# Patient Record
Sex: Male | Born: 1935 | Race: White | Hispanic: No | Marital: Married | Smoking: Former smoker
Health system: Southern US, Community
[De-identification: ages and names within clinical notes are randomized; demographics above are authoritative.]

## PROBLEM LIST (undated history)

## (undated) DIAGNOSIS — N400 Enlarged prostate without lower urinary tract symptoms: Secondary | ICD-10-CM

## (undated) DIAGNOSIS — I10 Essential (primary) hypertension: Secondary | ICD-10-CM

## (undated) DIAGNOSIS — C801 Malignant (primary) neoplasm, unspecified: Secondary | ICD-10-CM

## (undated) HISTORY — PX: ADENOIDECTOMY: SUR15

## (undated) HISTORY — PX: TONSILLECTOMY: SUR1361

## (undated) HISTORY — DX: Essential (primary) hypertension: I10

## (undated) HISTORY — PX: PARTIAL COLECTOMY: SHX5273

## (undated) HISTORY — PX: ROTATOR CUFF REPAIR: SHX139

## (undated) HISTORY — DX: Benign prostatic hyperplasia without lower urinary tract symptoms: N40.0

---

## 2001-10-22 DIAGNOSIS — K635 Polyp of colon: Secondary | ICD-10-CM | POA: Insufficient documentation

## 2006-04-26 ENCOUNTER — Ambulatory Visit: Payer: Self-pay | Admitting: Internal Medicine

## 2007-12-20 ENCOUNTER — Ambulatory Visit: Payer: Self-pay

## 2008-01-31 ENCOUNTER — Ambulatory Visit: Payer: Self-pay | Admitting: Orthopaedic Surgery

## 2008-01-31 ENCOUNTER — Other Ambulatory Visit: Payer: Self-pay

## 2008-02-06 ENCOUNTER — Ambulatory Visit: Payer: Self-pay | Admitting: Orthopaedic Surgery

## 2009-07-21 DEATH — deceased

## 2009-08-11 ENCOUNTER — Ambulatory Visit: Payer: Self-pay | Admitting: Gastroenterology

## 2009-12-21 ENCOUNTER — Ambulatory Visit: Payer: Self-pay | Admitting: Orthopedic Surgery

## 2010-05-05 ENCOUNTER — Ambulatory Visit: Payer: Self-pay | Admitting: Orthopedic Surgery

## 2010-05-11 ENCOUNTER — Ambulatory Visit: Payer: Self-pay | Admitting: Cardiovascular Disease

## 2010-05-17 ENCOUNTER — Ambulatory Visit: Payer: Self-pay | Admitting: Orthopedic Surgery

## 2010-05-19 ENCOUNTER — Ambulatory Visit: Payer: Self-pay | Admitting: Orthopedic Surgery

## 2010-07-06 ENCOUNTER — Ambulatory Visit: Payer: Self-pay | Admitting: Internal Medicine

## 2012-09-18 ENCOUNTER — Encounter: Payer: Self-pay | Admitting: Orthopedic Surgery

## 2012-09-20 ENCOUNTER — Encounter: Payer: Self-pay | Admitting: Orthopedic Surgery

## 2013-01-21 DIAGNOSIS — R972 Elevated prostate specific antigen [PSA]: Secondary | ICD-10-CM | POA: Insufficient documentation

## 2013-01-21 DIAGNOSIS — N529 Male erectile dysfunction, unspecified: Secondary | ICD-10-CM | POA: Insufficient documentation

## 2014-10-08 ENCOUNTER — Ambulatory Visit: Payer: Self-pay | Admitting: Gastroenterology

## 2014-11-24 DIAGNOSIS — I10 Essential (primary) hypertension: Secondary | ICD-10-CM | POA: Insufficient documentation

## 2014-11-24 DIAGNOSIS — R42 Dizziness and giddiness: Secondary | ICD-10-CM | POA: Insufficient documentation

## 2014-11-24 DIAGNOSIS — E785 Hyperlipidemia, unspecified: Secondary | ICD-10-CM | POA: Insufficient documentation

## 2014-11-24 DIAGNOSIS — C4490 Unspecified malignant neoplasm of skin, unspecified: Secondary | ICD-10-CM | POA: Insufficient documentation

## 2014-11-24 DIAGNOSIS — M109 Gout, unspecified: Secondary | ICD-10-CM | POA: Insufficient documentation

## 2014-11-24 DIAGNOSIS — M1990 Unspecified osteoarthritis, unspecified site: Secondary | ICD-10-CM | POA: Insufficient documentation

## 2014-11-24 DIAGNOSIS — R351 Nocturia: Secondary | ICD-10-CM | POA: Insufficient documentation

## 2015-03-15 LAB — SURGICAL PATHOLOGY

## 2015-07-30 ENCOUNTER — Other Ambulatory Visit
Admission: RE | Admit: 2015-07-30 | Discharge: 2015-07-30 | Disposition: A | Discharge status: Home / Self Care | Payer: Medicare Other | Source: Ambulatory Visit | Attending: Internal Medicine | Admitting: Internal Medicine

## 2015-07-30 ENCOUNTER — Other Ambulatory Visit: Payer: Self-pay | Admitting: Internal Medicine

## 2015-07-30 DIAGNOSIS — R51 Headache: Principal | ICD-10-CM

## 2015-07-30 DIAGNOSIS — R519 Headache, unspecified: Secondary | ICD-10-CM

## 2015-07-30 DIAGNOSIS — R61 Generalized hyperhidrosis: Secondary | ICD-10-CM | POA: Diagnosis present

## 2015-07-30 LAB — TROPONIN I: Troponin I: <0.03 ng/mL (ref <0.031)

## 2015-08-04 ENCOUNTER — Ambulatory Visit
Admission: RE | Admit: 2015-08-04 | Discharge: 2015-08-04 | Disposition: A | Discharge status: Home / Self Care | Payer: Medicare Other | Source: Ambulatory Visit | Attending: Internal Medicine | Admitting: Internal Medicine

## 2015-08-04 DIAGNOSIS — R51 Headache: Secondary | ICD-10-CM | POA: Diagnosis not present

## 2015-08-04 DIAGNOSIS — R519 Headache, unspecified: Secondary | ICD-10-CM

## 2015-08-06 ENCOUNTER — Ambulatory Visit: Payer: Self-pay

## 2016-02-23 DIAGNOSIS — I700 Atherosclerosis of aorta: Secondary | ICD-10-CM | POA: Insufficient documentation

## 2016-06-19 ENCOUNTER — Other Ambulatory Visit: Payer: Self-pay | Admitting: Surgery

## 2016-06-19 DIAGNOSIS — M25562 Pain in left knee: Secondary | ICD-10-CM

## 2016-06-29 ENCOUNTER — Ambulatory Visit
Admission: RE | Admit: 2016-06-29 | Discharge: 2016-06-29 | Disposition: A | Discharge status: Home / Self Care | Payer: Medicare Other | Source: Ambulatory Visit | Attending: Surgery | Admitting: Surgery

## 2016-06-29 DIAGNOSIS — M23632 Other spontaneous disruption of medial collateral ligament of left knee: Secondary | ICD-10-CM | POA: Diagnosis not present

## 2016-06-29 DIAGNOSIS — M25462 Effusion, left knee: Secondary | ICD-10-CM | POA: Diagnosis not present

## 2016-06-29 DIAGNOSIS — M25562 Pain in left knee: Secondary | ICD-10-CM | POA: Diagnosis present

## 2016-06-29 DIAGNOSIS — M76892 Other specified enthesopathies of left lower limb, excluding foot: Secondary | ICD-10-CM | POA: Diagnosis not present

## 2017-07-30 ENCOUNTER — Encounter (INDEPENDENT_AMBULATORY_CARE_PROVIDER_SITE_OTHER): Payer: Self-pay

## 2017-07-30 ENCOUNTER — Encounter (INDEPENDENT_AMBULATORY_CARE_PROVIDER_SITE_OTHER): Payer: Medicare Other

## 2017-08-07 ENCOUNTER — Other Ambulatory Visit (INDEPENDENT_AMBULATORY_CARE_PROVIDER_SITE_OTHER): Payer: Self-pay | Admitting: Family Medicine

## 2017-08-07 ENCOUNTER — Other Ambulatory Visit (INDEPENDENT_AMBULATORY_CARE_PROVIDER_SITE_OTHER): Payer: Medicare Other

## 2017-08-07 DIAGNOSIS — I10 Essential (primary) hypertension: Secondary | ICD-10-CM | POA: Diagnosis not present

## 2017-09-26 DIAGNOSIS — M7540 Impingement syndrome of unspecified shoulder: Secondary | ICD-10-CM | POA: Insufficient documentation

## 2018-05-07 ENCOUNTER — Ambulatory Visit (INDEPENDENT_AMBULATORY_CARE_PROVIDER_SITE_OTHER): Payer: Medicare Other | Admitting: Urology

## 2018-05-07 ENCOUNTER — Encounter: Payer: Self-pay | Admitting: Urology

## 2018-05-07 VITALS — BP 165/78 | HR 77 | Ht 69.0 in | Wt 171.6 lb

## 2018-05-07 DIAGNOSIS — N401 Enlarged prostate with lower urinary tract symptoms: Secondary | ICD-10-CM | POA: Diagnosis not present

## 2018-05-07 MED ORDER — SILDENAFIL CITRATE 20 MG PO TABS: ORAL_TABLET | ORAL | 0 refills | Status: DC

## 2018-05-07 MED ORDER — MIRABEGRON ER 50 MG PO TB24: 50.0000 mg | ORAL_TABLET | Freq: Every day | ORAL | 0 refills | Status: DC

## 2018-05-07 NOTE — Progress Notes (Signed)
05/07/2018 9:02 AM   Brian Little [birth year] [id number]  Referring provider: Adin Hector, MD [facility] [place], [place] [address]  Chief Complaint  Patient presents with  . Benign Prostatic Hypertrophy    HPI: 84 year old male previously seen by me in [place] and at [facility] for BPH and an elevated PSA.  He had a prostate biopsy 1998 for PSA of 8.6 with benign pathology.  His most recent PSA results have been stable in the upper 3/low 4 range.  He has a several year history of lower urinary tract symptoms with bothersome nocturia.  He had no improvement with anticholinergic medication, DDAVP.  He had orthostatic changes with tamsulosin.  At his visit last year he was given a trial of silodosin however also had lightheadedness and headache.  His voiding pattern is currently stable.  He has nocturia x4-6.  He takes sildenafil as needed for ED with good efficacy.  His PSA last year was 3.69.   PMH: Past Medical History:  Diagnosis Date  . Hypertension     Surgical History: Past Surgical History:  Procedure Laterality Date  . ROTATOR CUFF REPAIR     x 2, 2008, 2011    Home Medications:  Allergies as of 05/07/2018      Reactions   Bee Venom Anaphylaxis   Caffeine Other (See Comments)   Other reaction(s): Other Vertigo vertigo   Ibuprofen    Other reaction(s): Other (See Comments), Other (See Comments) vertigo vertigo   Nsaids Other (See Comments)   Other reaction(s): Other (See Comments) Vertigo and n/v Vertigo and n/v      Medication List        Accurate as of 05/07/18  9:02 AM. Always use your most recent med list.          amLODipine 10 MG tablet Commonly known as:  NORVASC   aspirin 325 MG tablet Take by mouth.   Azelastine HCl 137 MCG/SPRAY Soln azelastine 137 mcg (0.1 %) nasal spray aerosol   cetirizine 10 MG tablet Commonly known as:  ZYRTEC Take by mouth.   chlorhexidine 0.12 % solution Commonly known  as:  PERIDEX chlorhexidine gluconate 0.12 % mouthwash   fluticasone 50 MCG/ACT nasal spray Commonly known as:  FLONASE SPRAY 2 SPRAYS INTO EACH NOSTRIL NIGHTLY IF NEEDED   hydrochlorothiazide 25 MG tablet Commonly known as:  HYDRODIURIL TK 1 T PO  D   losartan 100 MG tablet Commonly known as:  COZAAR   meclizine 25 MG tablet Commonly known as:  ANTIVERT Take by mouth.   sildenafil 20 MG tablet Commonly known as:  REVATIO 1-5 tablets as needed 1 hr prior to intercourse       Allergies:  Allergies  Allergen Reactions  . Bee Venom Anaphylaxis  . Caffeine Other (See Comments)    Other reaction(s): Other Vertigo vertigo   . Ibuprofen     Other reaction(s): Other (See Comments), Other (See Comments) vertigo vertigo   . Nsaids Other (See Comments)    Other reaction(s): Other (See Comments) Vertigo and n/v Vertigo and n/v     Family History: Family History  Problem Relation Age of Onset  . Heart disease Father   . COPD Mother     Social History:  reports that he has never smoked. He has never used smokeless tobacco. He reports that he does not use drugs. His alcohol history is not on file.  ROS: UROLOGY Frequent Urination?: Yes Hard to postpone urination?:  No Burning/pain with urination?: No Get up at night to urinate?: Yes Leakage of urine?: No Urine stream starts and stops?: Yes Trouble starting stream?: No Do you have to strain to urinate?: No Blood in urine?: No Urinary tract infection?: No Sexually transmitted disease?: No Injury to kidneys or bladder?: No Painful intercourse?: No Weak stream?: Yes Erection problems?: No Penile pain?: No  Gastrointestinal Nausea?: No Vomiting?: No Indigestion/heartburn?: No Diarrhea?: No Constipation?: No  Constitutional Fever: No Night sweats?: No Weight loss?: No Fatigue?: No  Skin Skin rash/lesions?: No Itching?: No  Eyes Blurred vision?: No Double vision?: No  Ears/Nose/Throat Sore  throat?: No Sinus problems?: No  Hematologic/Lymphatic Swollen glands?: No Easy bruising?: No  Cardiovascular Leg swelling?: No Chest pain?: No  Respiratory Cough?: No Shortness of breath?: No  Endocrine Excessive thirst?: No  Musculoskeletal Back pain?: No Joint pain?: No  Neurological Headaches?: No Dizziness?: No  Psychologic Depression?: No Anxiety?: No  Physical Exam: BP (!) 165/78 (BP Location: Left Arm, Patient Position: Sitting, Cuff Size: Normal)   Pulse 77   Ht 5\' 9"  (1.753 m)   Wt 171 lb 9.6 oz (77.8 kg)   SpO2 99%   BMI 25.34 kg/m   Constitutional:  Alert and oriented, No acute distress. HEENT: [place] AT, moist mucus membranes.  Trachea midline, no masses. Cardiovascular: No clubbing, cyanosis, or edema. Respiratory: Normal respiratory effort, no increased work of breathing. GI: Abdomen is soft, nontender, nondistended, no abdominal masses GU: No CVA tenderness prostate 80+ cc, smooth without nodules. Lymph: No cervical or inguinal lymphadenopathy. Skin: No rashes, bruises or suspicious lesions. Neurologic: Grossly intact, no focal deficits, moving all 4 extremities. Psychiatric: Normal mood and affect.   Assessment & Plan:   84 year old male with lower urinary tract symptoms which are stable.  Will give a trial of Myrbetriq and he was given samples 50 mg.  We discussed last year discontinuing PSA screening based on his age and stability.  He requested that his PSA be checked one more time.  Sildenafil was refilled.  Return in about 1 year (around 05/08/2019) for Recheck.   Abbie Sons, [facility] [address] [place], [place] [address] [phone]

## 2018-05-08 ENCOUNTER — Telehealth: Payer: Self-pay

## 2018-05-08 LAB — PSA: Prostate Specific Ag, Serum: 4.4 ng/mL — ABNORMAL HIGH (ref 0.0–4.0)

## 2018-05-08 NOTE — Telephone Encounter (Signed)
Pt informed

## 2018-05-08 NOTE — Telephone Encounter (Signed)
-----   Message from Abbie Sons, MD sent at 05/08/2018  7:23 AM EDT ----- PSA was 4.4 which is within his baseline.

## 2018-06-04 ENCOUNTER — Telehealth: Payer: Self-pay | Admitting: Urology

## 2018-06-04 MED ORDER — MIRABEGRON ER 50 MG PO TB24: 50.0000 mg | ORAL_TABLET | Freq: Every day | ORAL | 6 refills | Status: DC

## 2018-06-04 NOTE — Addendum Note (Signed)
Addended by: Abbie Sons on: 06/04/2018 09:57 PM   Modules accepted: Orders

## 2018-06-04 NOTE — Telephone Encounter (Signed)
Pt states he was given sample to try and so far likes the results, Pt asks for Rx of Myrbetriq 50 mg to be sent to [facility] please.  Please advise pt at [phone].

## 2018-06-04 NOTE — Telephone Encounter (Signed)
rx sent

## 2018-11-01 DIAGNOSIS — D7219 Other eosinophilia: Secondary | ICD-10-CM | POA: Insufficient documentation

## 2018-11-01 DIAGNOSIS — G63 Polyneuropathy in diseases classified elsewhere: Secondary | ICD-10-CM | POA: Insufficient documentation

## 2018-11-01 DIAGNOSIS — I739 Peripheral vascular disease, unspecified: Secondary | ICD-10-CM | POA: Insufficient documentation

## 2018-11-05 ENCOUNTER — Other Ambulatory Visit: Payer: Self-pay | Admitting: Urology

## 2018-11-05 MED ORDER — SILDENAFIL CITRATE 20 MG PO TABS: ORAL_TABLET | ORAL | 0 refills | Status: DC

## 2018-11-05 NOTE — Telephone Encounter (Signed)
Dr. Bernardo Heater  Please advise on pt refill request for Sildenafil 20mg  last filled 05/07/18 with #[phone]

## 2018-11-05 NOTE — Telephone Encounter (Signed)
Pt asking for refill on his sildenafil 20mg  at [facility]. Please advise. Thanks

## 2018-11-18 ENCOUNTER — Other Ambulatory Visit: Payer: Self-pay | Admitting: Internal Medicine

## 2018-11-18 DIAGNOSIS — R9389 Abnormal findings on diagnostic imaging of other specified body structures: Secondary | ICD-10-CM

## 2018-11-26 ENCOUNTER — Ambulatory Visit
Admission: RE | Admit: 2018-11-26 | Discharge: 2018-11-26 | Disposition: A | Discharge status: Home / Self Care | Payer: Medicare Other | Source: Ambulatory Visit | Attending: Internal Medicine | Admitting: Internal Medicine

## 2018-11-26 DIAGNOSIS — R9389 Abnormal findings on diagnostic imaging of other specified body structures: Secondary | ICD-10-CM | POA: Insufficient documentation

## 2018-11-29 ENCOUNTER — Other Ambulatory Visit: Payer: Self-pay | Admitting: Internal Medicine

## 2018-11-29 DIAGNOSIS — R93429 Abnormal radiologic findings on diagnostic imaging of unspecified kidney: Secondary | ICD-10-CM

## 2018-12-12 ENCOUNTER — Ambulatory Visit
Admission: RE | Admit: 2018-12-12 | Discharge: 2018-12-12 | Disposition: A | Discharge status: Home / Self Care | Payer: Medicare Other | Source: Ambulatory Visit | Attending: Internal Medicine | Admitting: Internal Medicine

## 2018-12-12 DIAGNOSIS — R93429 Abnormal radiologic findings on diagnostic imaging of unspecified kidney: Secondary | ICD-10-CM | POA: Insufficient documentation

## 2018-12-16 DIAGNOSIS — R911 Solitary pulmonary nodule: Secondary | ICD-10-CM | POA: Insufficient documentation

## 2018-12-20 ENCOUNTER — Other Ambulatory Visit
Admission: RE | Admit: 2018-12-20 | Discharge: 2018-12-20 | Disposition: A | Discharge status: Home / Self Care | Payer: Medicare Other | Source: Ambulatory Visit | Attending: Internal Medicine | Admitting: Internal Medicine

## 2018-12-20 DIAGNOSIS — I10 Essential (primary) hypertension: Secondary | ICD-10-CM | POA: Diagnosis present

## 2018-12-20 DIAGNOSIS — I700 Atherosclerosis of aorta: Secondary | ICD-10-CM | POA: Diagnosis present

## 2018-12-20 DIAGNOSIS — I739 Peripheral vascular disease, unspecified: Secondary | ICD-10-CM | POA: Insufficient documentation

## 2018-12-20 DIAGNOSIS — M1000 Idiopathic gout, unspecified site: Secondary | ICD-10-CM | POA: Diagnosis present

## 2018-12-20 DIAGNOSIS — G63 Polyneuropathy in diseases classified elsewhere: Secondary | ICD-10-CM | POA: Diagnosis present

## 2018-12-20 DIAGNOSIS — D721 Eosinophilia: Secondary | ICD-10-CM | POA: Insufficient documentation

## 2018-12-20 LAB — TROPONIN I: Troponin I: 0.04 ng/mL (ref <0.03)

## 2018-12-31 ENCOUNTER — Other Ambulatory Visit
Admission: RE | Admit: 2018-12-31 | Discharge: 2018-12-31 | Disposition: A | Discharge status: Home / Self Care | Payer: Medicare Other | Source: Ambulatory Visit | Attending: Cardiology | Admitting: Cardiology

## 2018-12-31 DIAGNOSIS — Z8679 Personal history of other diseases of the circulatory system: Secondary | ICD-10-CM | POA: Insufficient documentation

## 2018-12-31 LAB — TROPONIN I

## 2019-01-09 ENCOUNTER — Inpatient Hospital Stay: Payer: Medicare Other | Attending: Oncology | Admitting: Oncology

## 2019-01-09 ENCOUNTER — Encounter: Payer: Self-pay | Admitting: Oncology

## 2019-01-09 ENCOUNTER — Inpatient Hospital Stay: Payer: Medicare Other | Admitting: *Deleted

## 2019-01-09 ENCOUNTER — Other Ambulatory Visit: Payer: Self-pay

## 2019-01-09 VITALS — BP 159/87 | HR 75 | Temp 97.4°F | Resp 18 | Ht 70.5 in | Wt 172.1 lb

## 2019-01-09 DIAGNOSIS — D721 Eosinophilia, unspecified: Secondary | ICD-10-CM

## 2019-01-09 DIAGNOSIS — Z79899 Other long term (current) drug therapy: Secondary | ICD-10-CM | POA: Diagnosis not present

## 2019-01-09 DIAGNOSIS — N400 Enlarged prostate without lower urinary tract symptoms: Secondary | ICD-10-CM

## 2019-01-09 DIAGNOSIS — Z87891 Personal history of nicotine dependence: Secondary | ICD-10-CM

## 2019-01-09 DIAGNOSIS — Z7982 Long term (current) use of aspirin: Secondary | ICD-10-CM | POA: Diagnosis not present

## 2019-01-09 DIAGNOSIS — I10 Essential (primary) hypertension: Secondary | ICD-10-CM | POA: Insufficient documentation

## 2019-01-09 LAB — CBC WITH DIFFERENTIAL/PLATELET
ABS IMMATURE GRANULOCYTES: 0.03 10*3/uL (ref 0.00–0.07)
Basophils Absolute: 0.1 10*3/uL (ref 0.0–0.1)
Basophils Relative: 1 %
Eosinophils Absolute: 1.9 10*3/uL — ABNORMAL HIGH (ref 0.0–0.5)
Eosinophils Relative: 21 %
HCT: 42.4 % (ref 39.0–52.0)
Hemoglobin: 14.9 g/dL (ref 13.0–17.0)
Immature Granulocytes: 0 %
LYMPHS ABS: 1.9 10*3/uL (ref 0.7–4.0)
Lymphocytes Relative: 20 %
MCH: 31.4 pg (ref 26.0–34.0)
MCHC: 35.1 g/dL (ref 30.0–36.0)
MCV: 89.5 fL (ref 80.0–100.0)
Monocytes Absolute: 0.8 10*3/uL (ref 0.1–1.0)
Monocytes Relative: 8 %
Neutro Abs: 4.7 10*3/uL (ref 1.7–7.7)
Neutrophils Relative %: 50 %
Platelets: 235 10*3/uL (ref 150–400)
RBC: 4.74 MIL/uL (ref 4.22–5.81)
RDW: 13.4 % (ref 11.5–15.5)
WBC: 9.4 10*3/uL (ref 4.0–10.5)
nRBC: 0 % (ref 0.0–0.2)

## 2019-01-09 LAB — TECHNOLOGIST SMEAR REVIEW: Plt Morphology: NORMAL

## 2019-01-09 NOTE — Progress Notes (Signed)
Patient here for initial visit. °

## 2019-01-10 NOTE — Progress Notes (Addendum)
Hematology/Oncology Consult note [facility] Telephone:([phone] [phone]   Patient Care Team: Adin Hector, MD as PCP - General (Internal Medicine)  REFERRING PROVIDER: Adin Hector, MD CHIEF COMPLAINTS/REASON FOR VISIT:  Evaluation of eosinophilia  HISTORY OF PRESENTING ILLNESS:  Brian Little is a  85 y.o.  male with PMH listed below who was referred to me for evaluation of eosinophilia. Patient follows up with Dr. [name].  He has history of chronic eosinophilia Patient has had work-up done for eosinophilia including echocardiogram, chest x-ray, stool studies for parasites and tryptase, troponin to look for endorgan damage and signs of hypereosinophilic syndrome.  10/25/2018 CBC showed white count 8.6, hemoglobin 15.3, platelet 245,000, Differential showed absolute eosinophils 1.83, eosinophil 21.4%. Chemistry showed sodium 135, potassium 3.5, chloride 96, creatinine 1.1, AST 18, ALT 15, bilirubin 0.8 UA negative 12/20/2018, CBC showed absolute eosinophils 1.67, eosinophil 20.9%  12/20/2018 stool parasites negative, tryptase elevated at 26, troponin level not available to me via care everywhere, noted to be elevated 0.04 in cardiology note.  Uric acid 6.5 10/25/2018 TSH 2.935 12/31/2018 ESR 7, Patient was seen by cardiology Dr. Ubaldo Glassing on 12/31/2018.  Per note, echocardiogram was read as showing LV function with no regional or global wall motion abnormalities.  LVEF 55% patient is also asymptomatic.  Denies any chest pain, significant dyspnea on exertion.  EKG shows no ischemic changes.  Troponin was repeated at the cardiologist office.  Results were not available to me.  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  Denies any cough, wheezing, fever, night sweats, unintentional weight loss, fatigue, eczema, pruritus, skin rash, lower extremity edema, nausea, vomiting, diarrhea, food intolerance, changes in bowel  habits, balancing issue, facial flushing, abdominal pain.  Denies any substance use.  Former smoker, quit in 1981.  Denies any alcohol use. Denies any recent start of any new medication.   Patient has history of allergy and takes Zyrtec 10 mg daily, Flonase.  He reports symptoms are stable, not acutely worsened.  Recent images were independently reviewed 11/18/2018, chest x-ray PA and lateral not available to me. 11/26/2018 CT chest without showed no evidence of airspace disease or consolidation at the right lung base. Focal groundglass density in the right upper lobe measuring roughly 7 mm.  Recommend follow-up CT 6 to 12 months. Mildly prominent lymph nodes in the mediastinum are nonspecific.  Aortic atherosclerosis, coronary artery calcifications, thickening of the left adrenal tissue.  Findings are probably associated with hyperplasia or adenoma but nonspecific.  Left kidney has a lobulated appearance and cannot exclude focal thickening or granularity along the anterior lateral aspect of the left kidney.  Consider further evaluation with renal ultrasound. 12/12/2018 renal ultrasound showed left kidney 1.5 cm cyst, no other abnormalities.    Review of Systems  Constitutional: Negative for appetite change, chills, fatigue, fever and unexpected weight change.  HENT:   Negative for hearing loss and voice change.        Allergy  Eyes: Negative for eye problems and icterus.  Respiratory: Negative for chest tightness, cough and shortness of breath.   Cardiovascular: Negative for chest pain and leg swelling.  Gastrointestinal: Negative for abdominal distention and abdominal pain.  Endocrine: Negative for hot flashes.  Genitourinary: Negative for difficulty urinating, dysuria and frequency.   Musculoskeletal: Negative for arthralgias.  Skin: Negative for itching and rash.  Neurological: Negative for light-headedness and numbness.  Hematological: Negative for adenopathy. Does not bruise/bleed  easily.  Psychiatric/Behavioral: Negative  for confusion.    MEDICAL HISTORY:  Past Medical History:  Diagnosis Date  . BPH (benign prostatic hyperplasia)   . Hypertension     SURGICAL HISTORY: Past Surgical History:  Procedure Laterality Date  . ADENOIDECTOMY    . ROTATOR CUFF REPAIR     x 2, 2008, 2011  . ROTATOR CUFF REPAIR     left and right  . TONSILLECTOMY     85 years old    SOCIAL HISTORY: Social History   Socioeconomic History  . Marital status: Married    Spouse name: Not on file  . Number of children: Not on file  . Years of education: Not on file  . Highest education level: Not on file  Occupational History  . Not on file  Social Needs  . Financial resource strain: Not on file  . Food insecurity:    Worry: Not on file    Inability: Not on file  . Transportation needs:    Medical: Not on file    Non-medical: Not on file  Tobacco Use  . Smoking status: Former Smoker    Last attempt to quit: 01/10/1980    Years since quitting: 39.0  . Smokeless tobacco: Never Used  Substance and Sexual Activity  . Alcohol use: Never    Frequency: Never  . Drug use: Never  . Sexual activity: Yes    Birth control/protection: None  Lifestyle  . Physical activity:    Days per week: Not on file    Minutes per session: Not on file  . Stress: Not on file  Relationships  . Social connections:    Talks on phone: Not on file    Gets together: Not on file    Attends religious service: Not on file    Active member of club or organization: Not on file    Attends meetings of clubs or organizations: Not on file    Relationship status: Not on file  . Intimate partner violence:    Fear of current or ex partner: Not on file    Emotionally abused: Not on file    Physically abused: Not on file    Forced sexual activity: Not on file  Other Topics Concern  . Not on file  Social History Narrative  . Not on file    FAMILY HISTORY: Family History  Problem Relation Age of  Onset  . Heart disease Father   . Congestive Heart Failure Father   . COPD Mother   . Hypertension Mother     ALLERGIES:  is allergic to bee venom; caffeine; ibuprofen; and nsaids.  MEDICATIONS:  Current Outpatient Medications  Medication Sig Dispense Refill  . amLODipine (NORVASC) 10 MG tablet   2  . aspirin 325 MG tablet Take by mouth.    . Azelastine HCl 137 MCG/SPRAY SOLN azelastine 137 mcg (0.1 %) nasal spray aerosol    . cetirizine (ZYRTEC) 10 MG tablet Take by mouth.    . chlorhexidine (PERIDEX) 0.12 % solution chlorhexidine gluconate 0.12 % mouthwash    . fluticasone (FLONASE) 50 MCG/ACT nasal spray SPRAY 2 SPRAYS INTO EACH NOSTRIL NIGHTLY IF NEEDED  4  . hydrochlorothiazide (HYDRODIURIL) 25 MG tablet TK 1 T PO  D  3  . losartan (COZAAR) 100 MG tablet   2  . meclizine (ANTIVERT) 25 MG tablet Take by mouth.    . sildenafil (REVATIO) 20 MG tablet 1-5 tablets as needed 1 hr prior to intercourse 90 tablet 0  . vitamin B-12 (  CYANOCOBALAMIN) 500 MCG tablet Take 500 mcg by mouth daily.    . mirabegron ER (MYRBETRIQ) 50 MG TB24 tablet Take 1 tablet (50 mg total) by mouth daily. (Patient not taking: Reported on 01/09/2019) 30 tablet 6  . Potassium (GNP POTASSIUM) 99 MG TABS Take by mouth. Take one tab daily     No current facility-administered medications for this visit.      PHYSICAL EXAMINATION: ECOG PERFORMANCE STATUS: 0 - Asymptomatic Vitals:   01/09/19 0931  BP: (!) 159/87  Pulse: 75  Resp: 18  Temp: (!) 97.4 F (36.3 C)   Filed Weights   01/09/19 0931  Weight: 172 lb 1.6 oz (78.1 kg)    Physical Exam Constitutional:      General: He is not in acute distress. HENT:     Head: Normocephalic and atraumatic.  Eyes:     General: No scleral icterus.    Pupils: Pupils are equal, round, and reactive to light.  Neck:     Musculoskeletal: Normal range of motion and neck supple.  Cardiovascular:     Rate and Rhythm: Normal rate and regular rhythm.     Heart sounds:  Normal heart sounds.  Pulmonary:     Effort: Pulmonary effort is normal. No respiratory distress.     Breath sounds: No wheezing.  Abdominal:     General: Bowel sounds are normal. There is no distension.     Palpations: Abdomen is soft. There is no mass.     Tenderness: There is no abdominal tenderness.  Musculoskeletal: Normal range of motion.        General: No deformity.  Skin:    General: Skin is warm and dry.     Findings: No erythema or rash.  Neurological:     Mental Status: He is alert and oriented to person, place, and time.     Cranial Nerves: No cranial nerve deficit.     Coordination: Coordination normal.  Psychiatric:        Behavior: Behavior normal.        Thought Content: Thought content normal.     RADIOGRAPHIC STUDIES: I have personally reviewed the radiological images as listed and agreed with the findings in the report.  No flowsheet data found. CBC Latest Ref Rng & Units 01/09/2019  WBC 4.0 - 10.5 K/uL 9.4  Hemoglobin 13.0 - 17.0 g/dL 14.9  Hematocrit 39.0 - 52.0 % 42.4  Platelets 150 - 400 K/uL 235    LABORATORY DATA:  I have reviewed the data as listed Lab Results  Component Value Date   WBC 9.4 01/09/2019   HGB 14.9 01/09/2019   HCT 42.4 01/09/2019   MCV 89.5 01/09/2019   PLT 235 01/09/2019   No results for input(s): NA, K, CL, CO2, GLUCOSE, BUN, CREATININE, CALCIUM, GFRNONAA, GFRAA, PROT, ALBUMIN, AST, ALT, ALKPHOS, BILITOT, BILIDIR, IBILI in the last 8760 hours. Iron/TIBC/Ferritin/ %Sat No results found for: IRON, TIBC, FERRITIN, IRONPCTSAT      ASSESSMENT & PLAN:  1. Eosinophilia    Labs reviewed and discussed with patient. HE is asymptomatic Absolute eosinophil count >1500/ul, recommend work up for Hypereosinophilic syndrome.   recommend to repeat CBC with differential, smear, tryptase, Immunoglobulin quantification, flowcytometry, MPN/HES panel  Additional testing based on work up results of above testing.    Orders Placed This  Encounter  Procedures  . CBC with Differential/Platelet    Standing Status:   Future    Number of Occurrences:   1    Standing Expiration Date:  01/10/2020  . Technologist smear review    Standing Status:   Future    Number of Occurrences:   1    Standing Expiration Date:   01/10/2020  . Tryptase    Standing Status:   Future    Number of Occurrences:   1    Standing Expiration Date:   01/10/2020  . Immunoglobulins, QN, A/E/G/M    Standing Status:   Future    Number of Occurrences:   1    Standing Expiration Date:   01/10/2020  . Flow cytometry panel-leukemia/lymphoma work-up    Standing Status:   Future    Number of Occurrences:   1    Standing Expiration Date:   01/10/2020    All questions were answered. The patient knows to call the clinic with any problems questions or concerns.  Return of visit: to be determined.  Thank you for this kind referral and the opportunity to participate in the care of this patient. A copy of today's note is routed to referring provider  Total face to face encounter time for this patient visit was 45 min. >50% of the time was  spent in counseling and coordination of care.    Earlie Server, MD, PhD Hematology Oncology [facility]- [phone] 01/10/2019  Addendum:  01/30/2019 Called patient and discussed with him about the results.  Tryptase is elevated. Immunoglobulin quantification showed slight elevation of IgA,  Flowcytometry showed no monoclonal B cell population is detected.  HES FISH panel showed no detection of  LNX1 (CHIC2), or rearrangement of PDGFRA,  PDGFRB, OR FGFR1 Discussed with patient that we can obtained additional testing, including JAK2, BCR ABL, tissue biopsy.  I recommend him to obtain a second opinion at tertiary center.  Patient says he is currently undergoing cardiology work up.  He will let me know about his decision.   Earlie Server 01/30/2019

## 2019-01-11 LAB — TRYPTASE: Tryptase: 25.4 ug/L — ABNORMAL HIGH (ref 2.2–13.2)

## 2019-01-11 LAB — IMMUNOGLOBULINS A/E/G/M, SERUM
IGM (IMMUNOGLOBULIN M), SRM: 82 mg/dL (ref 15–143)
IgA: 477 mg/dL — ABNORMAL HIGH (ref 61–437)
IgE (Immunoglobulin E), Serum: 67 IU/mL (ref 6–495)
IgG (Immunoglobin G), Serum: 868 mg/dL (ref 700–1600)

## 2019-01-13 LAB — COMP PANEL: LEUKEMIA/LYMPHOMA

## 2019-01-28 LAB — MISC LABCORP TEST (SEND OUT): Labcorp test code: 511444

## 2019-05-05 ENCOUNTER — Telehealth (INDEPENDENT_AMBULATORY_CARE_PROVIDER_SITE_OTHER): Payer: Medicare Other | Admitting: Urology

## 2019-05-05 ENCOUNTER — Other Ambulatory Visit: Payer: Self-pay

## 2019-05-05 DIAGNOSIS — N138 Other obstructive and reflux uropathy: Secondary | ICD-10-CM

## 2019-05-05 DIAGNOSIS — N401 Enlarged prostate with lower urinary tract symptoms: Secondary | ICD-10-CM

## 2019-05-05 DIAGNOSIS — N5201 Erectile dysfunction due to arterial insufficiency: Secondary | ICD-10-CM

## 2019-05-05 DIAGNOSIS — R972 Elevated prostate specific antigen [PSA]: Secondary | ICD-10-CM

## 2019-05-05 MED ORDER — TADALAFIL 5 MG PO TABS: 5.0000 mg | ORAL_TABLET | Freq: Every day | ORAL | 1 refills | Status: DC

## 2019-05-05 NOTE — Progress Notes (Signed)
Virtual Visit via Telephone Note  I connected with Brian Little on 05/05/19 at 11:00 AM EDT by telephone and verified that I am speaking with the correct person using two identifiers.  Location: Patient: Home Provider: Office   I discussed the limitations, risks, security and privacy concerns of performing an evaluation and management service by telephone and the availability of in person appointments. I also discussed with the patient that there may be a patient responsible charge related to this service. The patient expressed understanding and agreed to proceed.  Urologic history: 1.  BPH with lower urinary tract symptoms with nocturia  -Bothersome LUTS; unable to tolerate tamsulosin and silodosin secondary to orthostatic changes/headache  No significant improvement on anticholinergic medication, DDAVP and Myrbetriq  2.  History elevated PSA  -Biopsy 1998 PSA 8.6 with benign pathology  -Most recent PSA levels over 3/low 4 range  History of Present Illness: 85 year old male presents for annual follow-up via telephone secondary to COVID-19 pandemic.  Since his visit last year he states his voiding pattern has stable.  His nocturia on average is 3-4 times per night.  He did not see any significant improvement with Myrbetriq.  He states his voiding symptoms not bothersome of that he would want outlet surgery.  Based on age and PSA stability we discussed discontinuing PSA screening last year however he wanted it checked 1 more time and that PSA was stable at 4.4.   Observations/Objective: N/A  Assessment and Plan: 85 year old male with BPH and lower urinary tract symptoms.  He inquired about other medications.  We discussed both daily low-dose tadalafil and finasteride.  He does take sildenafil for ED.  Will discontinue the sildenafil and give a trial of low-dose tadalafil.  He will try for 90 days and if no significant efficacy on his LUTS will add finasteride.  Discontinue PSA  screening.  Follow Up Instructions: 6 months   I discussed the assessment and treatment plan with the patient. The patient was provided an opportunity to ask questions and all were answered. The patient agreed with the plan and demonstrated an understanding of the instructions.   The patient was advised to call back or seek an in-person evaluation if the symptoms worsen or if the condition fails to improve as anticipated.  I provided 15 minutes of non-face-to-face time during this encounter.   Abbie Sons, MD

## 2019-05-09 ENCOUNTER — Ambulatory Visit: Payer: Medicare Other | Admitting: Urology

## 2019-10-14 ENCOUNTER — Telehealth: Payer: Self-pay | Admitting: Urology

## 2019-10-14 DIAGNOSIS — N401 Enlarged prostate with lower urinary tract symptoms: Secondary | ICD-10-CM

## 2019-10-14 MED ORDER — TADALAFIL 5 MG PO TABS: 5.0000 mg | ORAL_TABLET | Freq: Every day | ORAL | 1 refills | Status: DC

## 2019-10-14 NOTE — Telephone Encounter (Signed)
Pt requests a refill for Tadalafil sent to [facility]. Please call pt when this is done.

## 2019-10-14 NOTE — Telephone Encounter (Signed)
RX sent. Called pt to inform him rx was sent. Line is busy

## 2019-11-04 ENCOUNTER — Ambulatory Visit: Payer: Medicare Other | Admitting: Urology

## 2019-11-05 ENCOUNTER — Ambulatory Visit (INDEPENDENT_AMBULATORY_CARE_PROVIDER_SITE_OTHER): Payer: Medicare Other | Admitting: Urology

## 2019-11-05 ENCOUNTER — Other Ambulatory Visit: Payer: Self-pay

## 2019-11-05 ENCOUNTER — Encounter: Payer: Self-pay | Admitting: Urology

## 2019-11-05 VITALS — BP 180/74 | Ht 70.5 in | Wt 165.0 lb

## 2019-11-05 DIAGNOSIS — N138 Other obstructive and reflux uropathy: Secondary | ICD-10-CM

## 2019-11-05 DIAGNOSIS — R351 Nocturia: Secondary | ICD-10-CM

## 2019-11-05 DIAGNOSIS — N401 Enlarged prostate with lower urinary tract symptoms: Secondary | ICD-10-CM

## 2019-11-05 NOTE — Progress Notes (Signed)
11/05/2019 9:52 AM   Brian Little [birth year] [id number]  Referring provider: Adin Hector, MD [facility] [place],  [place] [address]  Chief Complaint  Patient presents with  . Follow-up    Urologic history: 1.  BPH with lower urinary tract symptoms with nocturia             -Bothersome LUTS; unable to tolerate tamsulosin and silodosin secondary to orthostatic changes/headache             -No significant improvement on anticholinergic medication, DDAVP and Myrbetriq  2.  History elevated PSA             -Biopsy 1998 PSA 8.6 with benign pathology             -Most recent PSA levels over 3/low 4 range  HPI: 85 y.o. male presents for follow-up of lower urinary tract symptoms.  Had a telephone visit June 2020 he elected to start low-dose tadalafil.  He has noted mild improvement in his lower urinary tract symptoms and desires to continue.  He does have nocturia x4 which does not interrupt his sleep pattern.  No history of snoring or sleep apnea.  His nighttime voided volumes are similar to daytime.  When he does not limit nighttime fluids he gets up 5-6 times per night.  Denies dysuria, gross hematuria or flank, abdominal, pelvic pain.   PMH: Past Medical History:  Diagnosis Date  . BPH (benign prostatic hyperplasia)   . Hypertension     Surgical History: Past Surgical History:  Procedure Laterality Date  . ADENOIDECTOMY    . ROTATOR CUFF REPAIR     x 2, 2008, 2011  . ROTATOR CUFF REPAIR     left and right  . TONSILLECTOMY     85 years old    Home Medications:  Allergies as of 11/05/2019      Reactions   Bee Venom Anaphylaxis   Caffeine Other (See Comments)   Other reaction(s): Other Vertigo vertigo   Ibuprofen    Other reaction(s): Other (See Comments), Other (See Comments) vertigo vertigo   Nsaids Other (See Comments)   Other reaction(s): Other (See Comments) Vertigo and n/v Vertigo and n/v      Medication List        Accurate as of November 05, 2019  9:52 AM. If you have any questions, ask your nurse or doctor.        amLODipine 10 MG tablet Commonly known as: NORVASC   aspirin 325 MG tablet Take by mouth.   Azelastine HCl 137 MCG/SPRAY Soln azelastine 137 mcg (0.1 %) nasal spray aerosol   cetirizine 10 MG tablet Commonly known as: ZYRTEC Take by mouth.   chlorhexidine 0.12 % solution Commonly known as: PERIDEX chlorhexidine gluconate 0.12 % mouthwash   fluticasone 50 MCG/ACT nasal spray Commonly known as: FLONASE SPRAY 2 SPRAYS INTO EACH NOSTRIL NIGHTLY IF NEEDED   GNP Potassium 99 MG Tabs Generic drug: Potassium Take by mouth. Take one tab daily   hydrochlorothiazide 25 MG tablet Commonly known as: HYDRODIURIL TK 1 T PO  D   losartan 100 MG tablet Commonly known as: COZAAR   meclizine 25 MG tablet Commonly known as: ANTIVERT Take by mouth.   Potassium Gluconate 2.5 MEQ Tabs Take by mouth.   predniSONE 5 MG tablet Commonly known as: DELTASONE Take by mouth.   tadalafil 5 MG tablet Commonly known as: CIALIS Take 1 tablet (5 mg total) by mouth  daily.   vitamin B-12 500 MCG tablet Commonly known as: CYANOCOBALAMIN Take 500 mcg by mouth daily.       Allergies:  Allergies  Allergen Reactions  . Bee Venom Anaphylaxis  . Caffeine Other (See Comments)    Other reaction(s): Other Vertigo vertigo   . Ibuprofen     Other reaction(s): Other (See Comments), Other (See Comments) vertigo vertigo   . Nsaids Other (See Comments)    Other reaction(s): Other (See Comments) Vertigo and n/v Vertigo and n/v     Family History: Family History  Problem Relation Age of Onset  . Heart disease Father   . Congestive Heart Failure Father   . COPD Mother   . Hypertension Mother     Social History:  reports that he quit smoking about 39 years ago. He has never used smokeless tobacco. He reports that he does not drink alcohol or use drugs.  ROS: UROLOGY Frequent  Urination?: Yes Hard to postpone urination?: No Burning/pain with urination?: No Get up at night to urinate?: Yes Leakage of urine?: No Urine stream starts and stops?: No Trouble starting stream?: No Do you have to strain to urinate?: No Blood in urine?: No Urinary tract infection?: No Sexually transmitted disease?: No Injury to kidneys or bladder?: No Painful intercourse?: No Weak stream?: Yes Erection problems?: No Penile pain?: No  Gastrointestinal Nausea?: No Vomiting?: No Indigestion/heartburn?: No Diarrhea?: No Constipation?: No  Constitutional Fever: No Night sweats?: No Weight loss?: No Fatigue?: No  Skin Skin rash/lesions?: No Itching?: No  Eyes Blurred vision?: No Double vision?: No  Ears/Nose/Throat Sore throat?: No Sinus problems?: No  Hematologic/Lymphatic Swollen glands?: No Easy bruising?: No  Cardiovascular Leg swelling?: No Chest pain?: No  Respiratory Cough?: No Shortness of breath?: No  Endocrine Excessive thirst?: No  Musculoskeletal Back pain?: No Joint pain?: No  Neurological Headaches?: No Dizziness?: No  Psychologic Depression?: No Anxiety?: No  Physical Exam: BP (!) 180/74 (BP Location: Left Arm, Patient Position: Sitting, Cuff Size: Normal)   Ht 5' 10.5" (1.791 m)   Wt 165 lb (74.8 kg)   BMI 23.34 kg/m   Constitutional:  Alert and oriented, No acute distress. HEENT: [place] AT, moist mucus membranes.  Trachea midline, no masses. Cardiovascular: No clubbing, cyanosis, or edema. Respiratory: Normal respiratory effort, no increased work of breathing. Neurologic: Grossly intact, no focal deficits, moving all 4 extremities. Psychiatric: Normal mood and affect.   Assessment & Plan:    - BPH with lower urinary tract symptoms He desires to continue low-dose tadalafil.  5-ARI medication was discussed however he wants to hold off for now.  - Nocturnal polyuria He did try DDAVP at [facility] which was not effective.  I did  discuss Nocdurna though it typically is not covered by insurance.  He wanted to hold off since his symptoms are not that bothersome at present.  Follow-up 1 year.  Greater than 50% of this 15-minute visit was spent counseling the patient.   Abbie Sons, [place] [address] [place], [place] [address] [phone]

## 2019-11-27 ENCOUNTER — Other Ambulatory Visit: Payer: Self-pay | Admitting: Internal Medicine

## 2019-11-27 DIAGNOSIS — R911 Solitary pulmonary nodule: Secondary | ICD-10-CM

## 2019-12-08 ENCOUNTER — Ambulatory Visit
Admission: RE | Admit: 2019-12-08 | Discharge: 2019-12-08 | Disposition: A | Discharge status: Home / Self Care | Payer: Medicare Other | Source: Ambulatory Visit | Attending: Internal Medicine | Admitting: Internal Medicine

## 2019-12-08 ENCOUNTER — Other Ambulatory Visit: Payer: Self-pay

## 2019-12-08 DIAGNOSIS — R911 Solitary pulmonary nodule: Secondary | ICD-10-CM | POA: Diagnosis present

## 2020-01-19 ENCOUNTER — Other Ambulatory Visit: Payer: Self-pay | Admitting: Urology

## 2020-01-19 DIAGNOSIS — N401 Enlarged prostate with lower urinary tract symptoms: Secondary | ICD-10-CM

## 2020-04-23 ENCOUNTER — Other Ambulatory Visit: Payer: Self-pay | Admitting: *Deleted

## 2020-04-23 ENCOUNTER — Encounter: Payer: Self-pay | Admitting: Urology

## 2020-04-23 DIAGNOSIS — N401 Enlarged prostate with lower urinary tract symptoms: Secondary | ICD-10-CM

## 2020-04-23 MED ORDER — TADALAFIL 5 MG PO TABS: 5.0000 mg | ORAL_TABLET | Freq: Every day | ORAL | 1 refills | Status: DC

## 2020-05-06 ENCOUNTER — Encounter: Payer: Self-pay | Admitting: Urology

## 2020-06-16 IMAGING — CT CT CHEST W/O CM
2 of 4 series · 15 of 36 positions shown, 18 images · non-contrast
Comparison: 11/26/2018

CLINICAL DATA: Follow-up right upper lobe ground-glass density

EXAM:
CT CHEST WITHOUT CONTRAST
TECHNIQUE: Multidetector CT imaging of the chest was performed following the
standard protocol without IV contrast.

[Series 2: chest 2.00 · axial · 0.65mm/px · z∈[-1421,-1109]mm · 12 of 186 slices shown, 15 images]
[im 15/186  mediastinal]
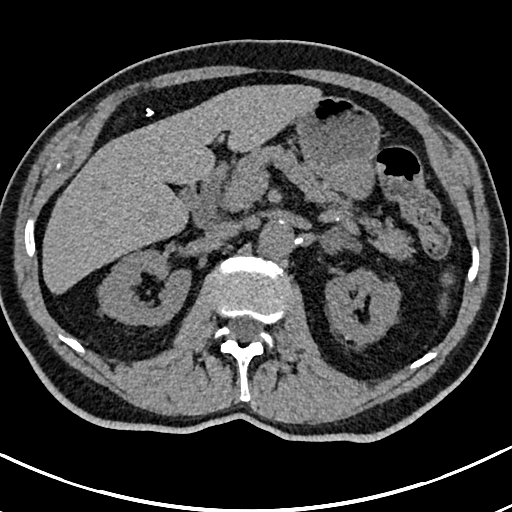
[im 15/186  lung]
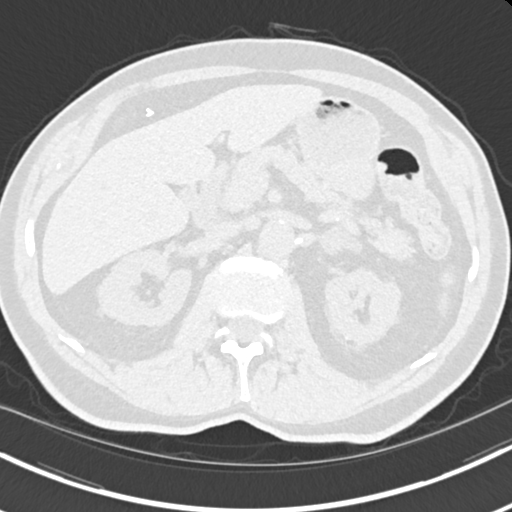
[im 29/186  lung]
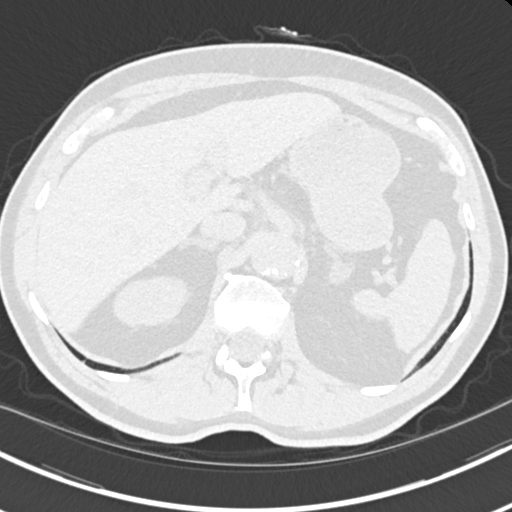
[im 43/186  lung]
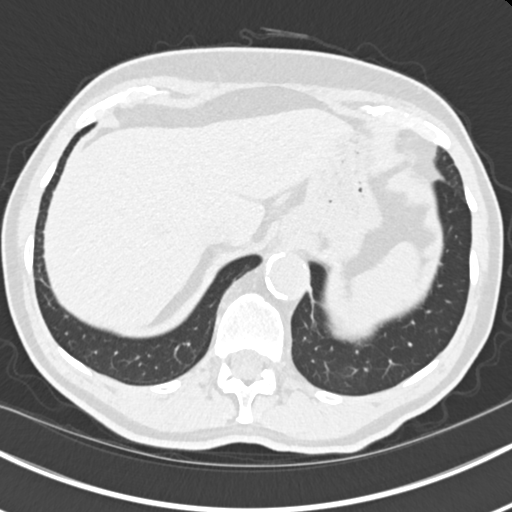
[im 57/186  lung]
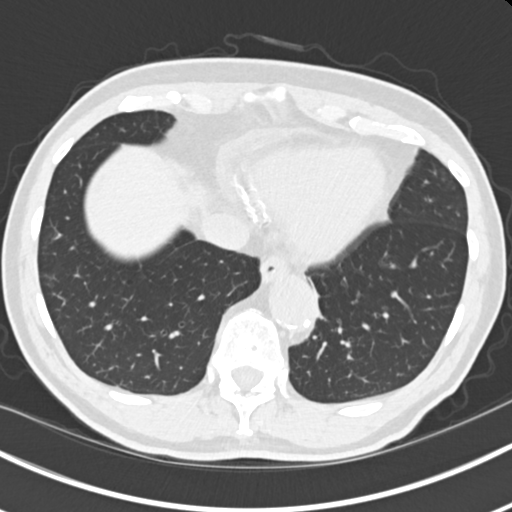
[im 72/186  mediastinal]
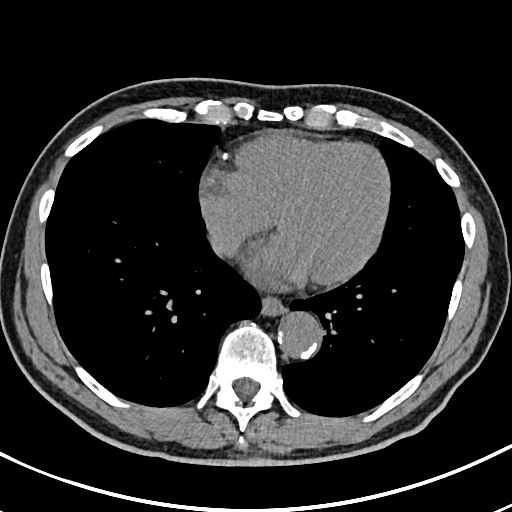
[im 72/186  lung]
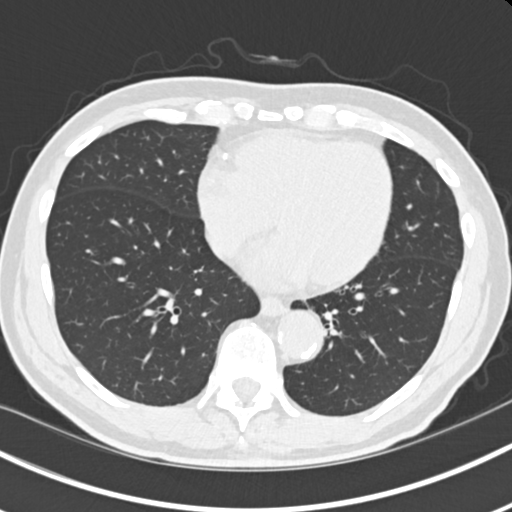
[im 86/186  lung]
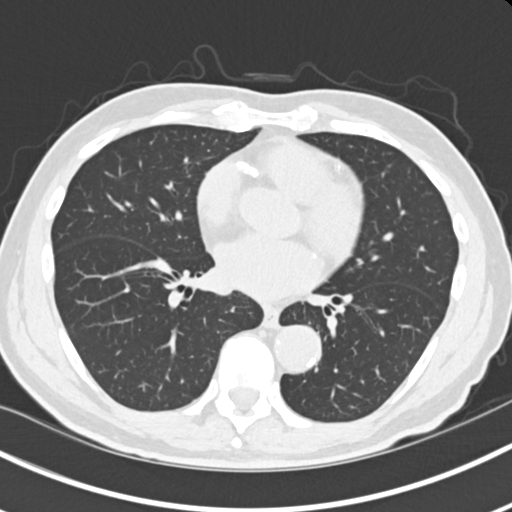
[im 100/186  lung]
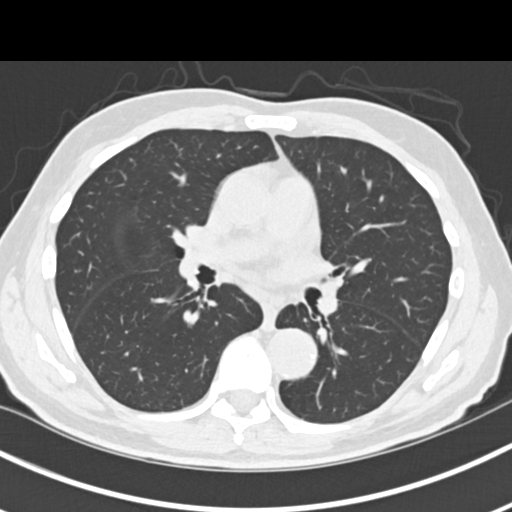
[im 114/186  lung]
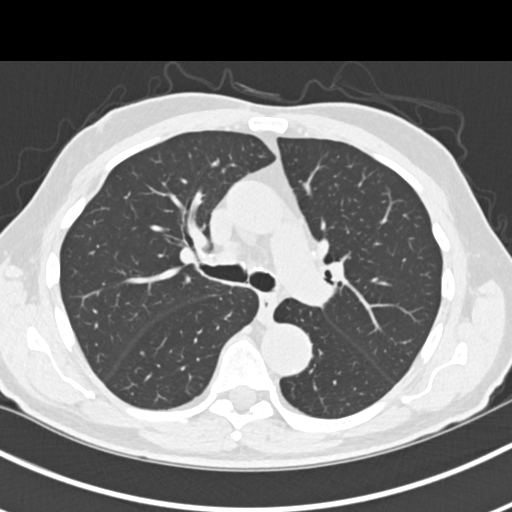
[im 129/186  mediastinal]
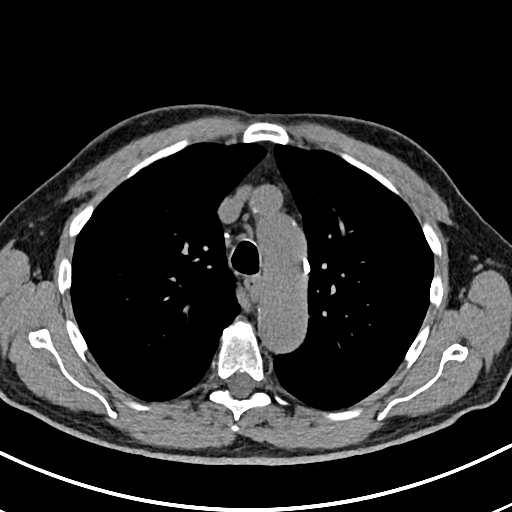
[im 129/186  lung]
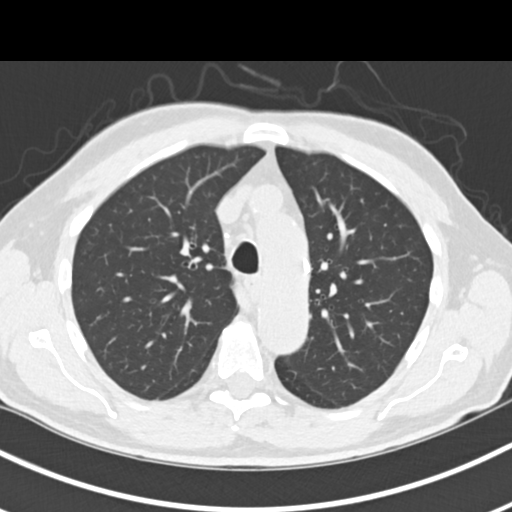
[im 143/186  lung]
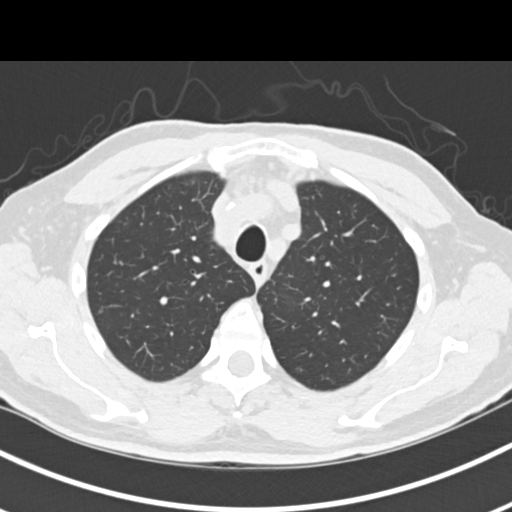
[im 157/186  lung]
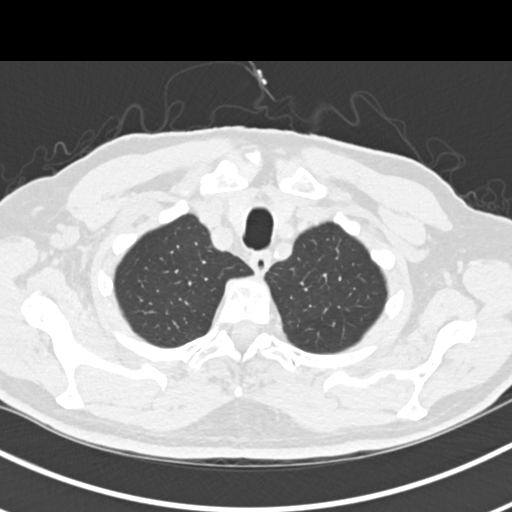
[im 171/186  lung]
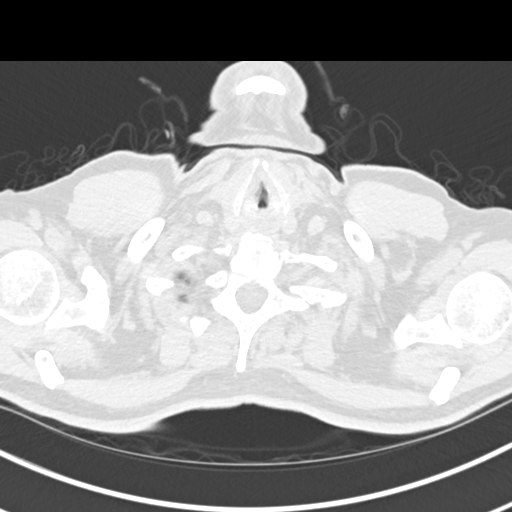

[Series 5: coronals chest 2.00 cor · coronal · 0.65mm/px · 3 of 139 slices shown]
[im 28/139  lung]
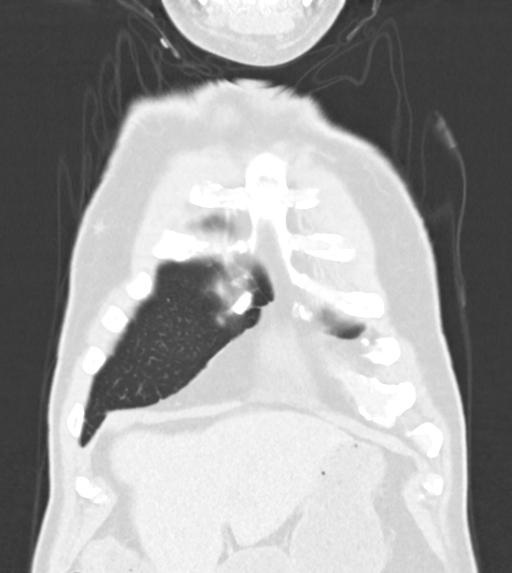
[im 56/139  lung]
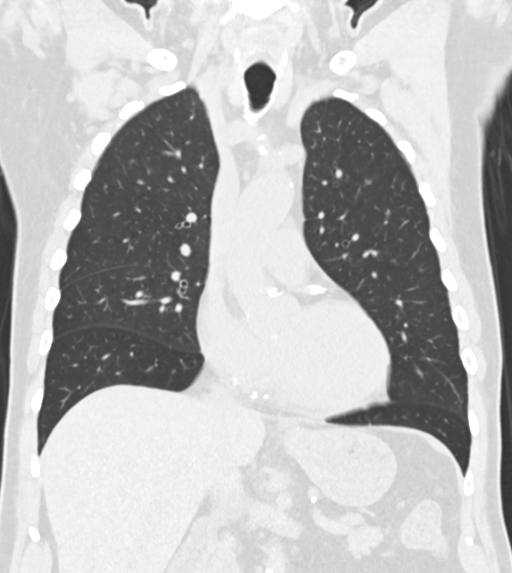
[im 83/139  lung]
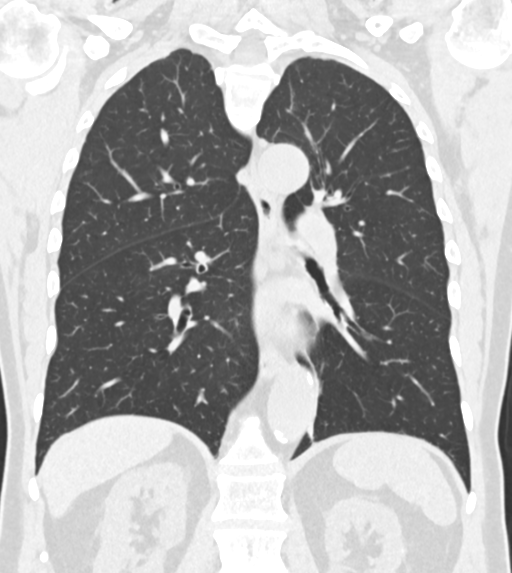

[15 of 36 positions shown; findings below may reference images not displayed]

FINDINGS: Cardiovascular: Thoracic aorta demonstrates atherosclerotic
calcifications without aneurysmal dilatation. No cardiac enlargement
is seen. Coronary calcifications are noted. No enlargement of the
pulmonary artery is seen.

Mediastinum/Nodes: Thoracic inlet is within normal limits. Scattered
small mediastinal lymph nodes are seen with normal fatty hila. The
overall appearance is stable from the prior exam. Some calcified
lymph nodes are noted consistent with prior granulomatous disease.
The esophagus as visualized is within normal limits.

Lungs/Pleura: The lungs are well aerated bilaterally. Stable
ground-glass opacity is noted in the posterior aspect of the right
upper lobe best seen on image number 50 of series 3. A small nodule
is noted in the right lower lobe best seen on image number 72 of
series 3. It is stable from the prior exam. No new focal infiltrate
or sizable effusion is noted.

Upper Abdomen: Stable thickening of the right adrenal gland is
noted. The left adrenal gland demonstrates a focal 18 mm nodule
which appears stable from the prior study. Remainder of the upper
abdomen is stable in appearance.

Musculoskeletal: Degenerative changes of the thoracic spine are
seen.
IMPRESSION: Stable ground-glass opacity in the right upper lobe as described.
Given its persistence, repeat CT is recommended every 2 years until
5 years of stability has been established. This recommendation
follows the consensus statement: Guidelines for Management of
Incidental Pulmonary Nodules Detected on CT Images: From the

Tiny nodule in the right lower lobe stable from the prior exam
likely representing a noncalcified granuloma. This can also be
followed on subsequent CT as described above.

Changes of prior granulomatous disease.

Stable left adrenal lesion likely representing an adenoma.

Aortic Atherosclerosis (WRLZC-V51.1).

## 2020-07-28 ENCOUNTER — Other Ambulatory Visit: Payer: Self-pay | Admitting: Urology

## 2020-07-28 ENCOUNTER — Other Ambulatory Visit: Payer: Self-pay | Admitting: *Deleted

## 2020-07-28 DIAGNOSIS — D7219 Other eosinophilia: Secondary | ICD-10-CM

## 2020-07-28 DIAGNOSIS — N401 Enlarged prostate with lower urinary tract symptoms: Secondary | ICD-10-CM

## 2020-08-12 ENCOUNTER — Other Ambulatory Visit: Payer: Self-pay

## 2020-08-12 ENCOUNTER — Inpatient Hospital Stay (HOSPITAL_BASED_OUTPATIENT_CLINIC_OR_DEPARTMENT_OTHER): Payer: Medicare Other | Admitting: Internal Medicine

## 2020-08-12 ENCOUNTER — Inpatient Hospital Stay: Payer: Medicare Other | Attending: Internal Medicine

## 2020-08-12 DIAGNOSIS — Z87891 Personal history of nicotine dependence: Secondary | ICD-10-CM | POA: Insufficient documentation

## 2020-08-12 DIAGNOSIS — D7219 Other eosinophilia: Secondary | ICD-10-CM

## 2020-08-12 DIAGNOSIS — D721 Eosinophilia, unspecified: Secondary | ICD-10-CM | POA: Diagnosis present

## 2020-08-12 LAB — CBC WITH DIFFERENTIAL/PLATELET
Abs Immature Granulocytes: 0.03 10*3/uL (ref 0.00–0.07)
Basophils Absolute: 0.1 10*3/uL (ref 0.0–0.1)
Basophils Relative: 1 %
Eosinophils Absolute: 1.1 10*3/uL — ABNORMAL HIGH (ref 0.0–0.5)
Eosinophils Relative: 13 %
HCT: 43.2 % (ref 39.0–52.0)
Hemoglobin: 15.4 g/dL (ref 13.0–17.0)
Immature Granulocytes: 0 %
Lymphocytes Relative: 23 %
Lymphs Abs: 1.9 10*3/uL (ref 0.7–4.0)
MCH: 31.6 pg (ref 26.0–34.0)
MCHC: 35.6 g/dL (ref 30.0–36.0)
MCV: 88.7 fL (ref 80.0–100.0)
Monocytes Absolute: 0.7 10*3/uL (ref 0.1–1.0)
Monocytes Relative: 9 %
Neutro Abs: 4.3 10*3/uL (ref 1.7–7.7)
Neutrophils Relative %: 54 %
Platelets: 215 10*3/uL (ref 150–400)
RBC: 4.87 MIL/uL (ref 4.22–5.81)
RDW: 14.3 % (ref 11.5–15.5)
WBC: 8 10*3/uL (ref 4.0–10.5)
nRBC: 0 % (ref 0.0–0.2)

## 2020-08-12 LAB — COMPREHENSIVE METABOLIC PANEL
ALT: 12 U/L (ref 0–44)
AST: 19 U/L (ref 15–41)
Albumin: 4.3 g/dL (ref 3.5–5.0)
Alkaline Phosphatase: 54 U/L (ref 38–126)
Anion gap: 11 (ref 5–15)
BUN: 13 mg/dL (ref 8–23)
CO2: 26 mmol/L (ref 22–32)
Calcium: 8.8 mg/dL — ABNORMAL LOW (ref 8.9–10.3)
Chloride: 101 mmol/L (ref 98–111)
Creatinine, Ser: 1.19 mg/dL (ref 0.61–1.24)
GFR calc Af Amer: >60 mL/min (ref >60)
GFR calc non Af Amer: 56 mL/min — ABNORMAL LOW (ref >60)
Glucose, Bld: 134 mg/dL — ABNORMAL HIGH (ref 70–99)
Potassium: 3.5 mmol/L (ref 3.5–5.1)
Sodium: 138 mmol/L (ref 135–145)
Total Bilirubin: 0.9 mg/dL (ref 0.3–1.2)
Total Protein: 7.4 g/dL (ref 6.5–8.1)

## 2020-08-12 LAB — LACTATE DEHYDROGENASE: LDH: 121 U/L (ref 98–192)

## 2020-08-12 NOTE — Progress Notes (Signed)
[name]  Patient Care Team: Adin Hector, MD as PCP - General (Internal Medicine)  CHIEF COMPLAINTS/PURPOSE OF CONSULTATION: ESOSINOPHILIA  # 6 2020-EOSINOPHILIA: AEC- 1.9; WBC/Hb/platelets-WNL [since 2015-1.0 to 2.5];2020-Dr.YuThe MPN with hypereosinophilia FISH was normal. 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    Oncology History   No history exists.     HISTORY OF PRESENTING ILLNESS:  Brian Little 86 y.o.  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  Patient was originally evaluated by hematology/ Dr.Yu in 2020 given his chronic eosinophilia.  At that time he declined bone marrow biopsy.  Patient was evaluated at the second opinion at [facility] he again declined bone marrow biopsy.  However an MRI because was done which showed nonischemic infiltrative process-differential diagnosis including eosinophilic myocarditis etc.  Patient was treated with prednisone 40 mg a day tapered over the 4 months.  Patient complains of poor tolerance to steroids including weight gain abdominal bloating etc.  Patient is currently back to baseline health.  Patient denies any symptoms attributable to eosinophilia-except for chronic mild allergies for which she has been taking antihistamine..  No rash.  No diarrhea.  No other allergic concerns.  As per patient he is patient is asymptomatic which is his biggest reluctance with any aggressive work-up.  Reviewed the records at length-office note/labs from previous hematology evaluation; labs from [facility]; also hematology evaluation at [facility]-summarized  above.  Review of Systems  Constitutional: Negative for chills, diaphoresis, fever, malaise/fatigue and weight loss.  HENT: Negative for nosebleeds and sore throat.   Eyes: Negative for double vision.  Respiratory: Negative for cough, hemoptysis, sputum production, shortness of breath and wheezing.   Cardiovascular: Negative for chest pain, palpitations, orthopnea and leg swelling.  Gastrointestinal: Negative for abdominal pain, blood in stool, constipation, diarrhea, heartburn, melena, nausea and vomiting.  Genitourinary: Negative for dysuria, frequency and urgency.  Musculoskeletal: Negative for back pain and joint pain.  Skin: Negative.  Negative for itching and rash.  Neurological: Negative for dizziness, tingling, focal weakness, weakness and headaches.  Endo/Heme/Allergies: Does not bruise/bleed easily.  Psychiatric/Behavioral: Negative for depression. The patient is not nervous/anxious and does not have insomnia.      MEDICAL HISTORY:  Past Medical History:  Diagnosis Date  . BPH (benign prostatic hyperplasia)   . Hypertension     SURGICAL HISTORY: Past Surgical History:  Procedure Laterality Date  . ADENOIDECTOMY    . ROTATOR CUFF REPAIR     x 2, 2008, 2011  . ROTATOR CUFF REPAIR     left and right  . TONSILLECTOMY     86 years old    SOCIAL HISTORY: Social History   Socioeconomic History  . Marital status: Married    Spouse name: Not on file  . Number of children: Not on file  . Years of education: Not on file  . Highest education level: Not on file  Occupational History  . Not on file  Tobacco Use  . Smoking status: Former Smoker    Quit date: 01/10/1980    Years since quitting: 40.6  . Smokeless tobacco: Never Used  Vaping Use  . Vaping Use: Never used  Substance and Sexual Activity  . Alcohol use: Never  . Drug use: Never  .  Sexual activity: Yes    Birth control/protection: None  Other Topics Concern  . Not on file  Social History Narrative  .  Not on file   Social Determinants of Health   Financial Resource Strain:   . Difficulty of Paying Living Expenses: Not on file  Food Insecurity:   . Worried About Charity fundraiser in the Last Year: Not on file  . Ran Out of Food in the Last Year: Not on file  Transportation Needs:   . Lack of Transportation (Medical): Not on file  . Lack of Transportation (Non-Medical): Not on file  Physical Activity:   . Days of Exercise per Week: Not on file  . Minutes of Exercise per Session: Not on file  Stress:   . Feeling of Stress : Not on file  Social Connections:   . Frequency of Communication with Friends and Family: Not on file  . Frequency of Social Gatherings with Friends and Family: Not on file  . Attends Religious Services: Not on file  . Active Member of Clubs or Organizations: Not on file  . Attends Archivist Meetings: Not on file  . Marital Status: Not on file  Intimate Partner Violence:   . Fear of Current or Ex-Partner: Not on file  . Emotionally Abused: Not on file  . Physically Abused: Not on file  . Sexually Abused: Not on file    FAMILY HISTORY: Family History  Problem Relation Age of Onset  . Heart disease Father   . Congestive Heart Failure Father   . COPD Mother   . Hypertension Mother     ALLERGIES:  is allergic to bee venom, caffeine, ibuprofen, and nsaids.  MEDICATIONS:  Current Outpatient Medications  Medication Sig Dispense Refill  . amLODipine (NORVASC) 10 MG tablet   2  . aspirin 325 MG tablet Take by mouth.    . Azelastine HCl 137 MCG/SPRAY SOLN azelastine 137 mcg (0.1 %) nasal spray aerosol    . cetirizine (ZYRTEC) 10 MG tablet Take by mouth.    . chlorhexidine (PERIDEX) 0.12 % solution chlorhexidine gluconate 0.12 % mouthwash    . fluticasone (FLONASE) 50 MCG/ACT nasal spray SPRAY 2 SPRAYS INTO EACH NOSTRIL NIGHTLY IF NEEDED  4  . hydrochlorothiazide (HYDRODIURIL) 25 MG tablet TK 1 T PO  D  3  . losartan (COZAAR) 100 MG tablet   2   . meclizine (ANTIVERT) 25 MG tablet Take by mouth.    . Potassium (GNP POTASSIUM) 99 MG TABS Take by mouth. Take one tab daily    . Potassium Gluconate 2.5 MEQ TABS Take by mouth.    . tadalafil (CIALIS) 5 MG tablet TAKE 1 TABLET BY MOUTH DAILY 90 tablet 1  . vitamin B-12 (CYANOCOBALAMIN) 500 MCG tablet Take 500 mcg by mouth daily.     No current facility-administered medications for this visit.      Marland Kitchen  PHYSICAL EXAMINATION: ECOG PERFORMANCE STATUS: 0 - Asymptomatic  Vitals:   08/12/20 0933  BP: (!) 161/68  Pulse: 88  Resp: 16  Temp: 98.1 F (36.7 C)  SpO2: 100%   Filed Weights   08/12/20 0933  Weight: 170 lb (77.1 kg)    Physical Exam HENT:     Head: Normocephalic and atraumatic.     Mouth/Throat:     Pharynx: No oropharyngeal exudate.  Eyes:     Pupils: Pupils are equal, round, and reactive to light.  Cardiovascular:     Rate and Rhythm: Normal rate  and regular rhythm.  Pulmonary:     Effort: No respiratory distress.     Breath sounds: No wheezing.  Abdominal:     General: Bowel sounds are normal. There is no distension.     Palpations: Abdomen is soft. There is no mass.     Tenderness: There is no abdominal tenderness. There is no guarding or rebound.  Musculoskeletal:        General: No tenderness. Normal range of motion.     Cervical back: Normal range of motion and neck supple.  Skin:    General: Skin is warm.  Neurological:     Mental Status: He is alert and oriented to person, place, and time.  Psychiatric:        Mood and Affect: Affect normal.      LABORATORY DATA:  I have reviewed the data as listed Lab Results  Component Value Date   WBC 8.0 08/12/2020   HGB 15.4 08/12/2020   HCT 43.2 08/12/2020   MCV 88.7 08/12/2020   PLT 215 08/12/2020   Recent Labs    08/12/20 0922  NA 138  K 3.5  CL 101  CO2 26  GLUCOSE 134*  BUN 13  CREATININE 1.19  CALCIUM 8.8*  GFRNONAA 56*  GFRAA >60  PROT 7.4  ALBUMIN 4.3  AST 19  ALT 12   ALKPHOS 54  BILITOT 0.9    RADIOGRAPHIC STUDIES: I have personally reviewed the radiological images as listed and agreed with the findings in the report. No results found.  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  However peripheral blood work negative for molecular work-up [The MPN with hypereosinophilia FISH was normal. No deletion of LNX1 or rearrangements of PDGFRA, PDGFRB, or FGFR1.; TRYPTASE 25 [N< 13]].  Patient still reluctant with bone marrow biopsy. Clinically asymptomatic however question involvement of heart[ see below].  #September 2020-MRI heart [[facility]]- The pattern of hyperenhancement is non-specific, but is suggestive of a non-ischemic infiltrative process [DDx: Eosinophilic myocarditis/cardiac amyloidosis/possible hypertrophic cardiomyopathy].  Clinically asymptomatic from cardiac standpoint.  S/p prednisone [for 4 months].  Would recommend repeating MRI to reevaluate the above cardiac abnormality.  Recommend MRI imaging at [facility].  Patient reluctantly agrees.  # I offered to call the patient's son to give an update on clinical status; patient declines.   #I discussed with Dr. Ubaldo Glassing patient's cardiologist; who agrees with above plan to repeat MRI.  Kindly agrees to order the MRI to [facility] system.   # DISPOSITION: Follow-up based on patient's MRI results. # follow up TBD- Dr.B  Cc; Dr.Fath/Dr.Klein  All questions were answered. The patient knows to call the clinic with any problems, questions or concerns.    Cammie Sickle, MD 08/16/2020 8:20 AM

## 2020-08-12 NOTE — Assessment & Plan Note (Addendum)
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  However peripheral blood work negative for molecular work-up [The MPN with hypereosinophilia FISH was normal. No deletion of LNX1 or rearrangements of PDGFRA, PDGFRB, or FGFR1.; TRYPTASE 25 [N< 13]].  Patient still reluctant with bone marrow biopsy. Clinically asymptomatic however question involvement of heart[ see below].  #September 2020-MRI heart [[facility]]- The pattern of hyperenhancement is non-specific, but is suggestive of a non-ischemic infiltrative process [DDx: Eosinophilic myocarditis/cardiac amyloidosis/possible hypertrophic cardiomyopathy].  Clinically asymptomatic from cardiac standpoint.  S/p prednisone [for 4 months].  Would recommend repeating MRI to reevaluate the above cardiac abnormality.  Recommend MRI imaging at [facility].  Patient reluctantly agrees.  # I offered to call the patient's son to give an update on clinical status; patient declines.   #I discussed with Dr. Ubaldo Glassing patient's cardiologist; who agrees with above plan to repeat MRI.  Kindly agrees to order the MRI to [facility] system.   # DISPOSITION: Follow-up based on patient's MRI results. # follow up TBD- Dr.B  Cc; Dr.Fath/Dr.Klein

## 2020-11-05 ENCOUNTER — Ambulatory Visit (INDEPENDENT_AMBULATORY_CARE_PROVIDER_SITE_OTHER): Payer: Medicare Other | Admitting: Urology

## 2020-11-05 ENCOUNTER — Encounter: Payer: Self-pay | Admitting: Urology

## 2020-11-05 ENCOUNTER — Other Ambulatory Visit: Payer: Self-pay

## 2020-11-05 VITALS — BP 173/77 | HR 74 | Ht 71.0 in | Wt 165.0 lb

## 2020-11-05 DIAGNOSIS — N401 Enlarged prostate with lower urinary tract symptoms: Secondary | ICD-10-CM | POA: Diagnosis not present

## 2020-11-05 DIAGNOSIS — R351 Nocturia: Secondary | ICD-10-CM

## 2020-11-05 DIAGNOSIS — N138 Other obstructive and reflux uropathy: Secondary | ICD-10-CM | POA: Diagnosis not present

## 2020-11-05 LAB — URINALYSIS, COMPLETE
Bilirubin, UA: NEGATIVE
Bilirubin, UA: NEGATIVE
Glucose, UA: NEGATIVE
Glucose, UA: NEGATIVE
Ketones, UA: NEGATIVE
Ketones, UA: NEGATIVE
Leukocytes,UA: NEGATIVE
Nitrite, UA: NEGATIVE
Nitrite, UA: NEGATIVE
Protein,UA: NEGATIVE
Protein,UA: NEGATIVE
RBC, UA: NEGATIVE
RBC, UA: NEGATIVE
Specific Gravity, UA: 1.02 (ref 1.005–1.030)
Specific Gravity, UA: 1.01 (ref 1.005–1.030)
Urobilinogen, Ur: 0.2 mg/dL (ref 0.2–1.0)
Urobilinogen, Ur: 0.2 mg/dL (ref 0.2–1.0)
pH, UA: 7 (ref 5.0–7.5)
pH, UA: 6 (ref 5.0–7.5)

## 2020-11-05 LAB — MICROSCOPIC EXAMINATION
Epithelial Cells (non renal): NONE SEEN /hpf (ref 0–10)
RBC, Urine: NONE SEEN /hpf (ref 0–2)

## 2020-11-05 LAB — BLADDER SCAN AMB NON-IMAGING
Scan Result: 30
Scan Result: 57

## 2020-11-05 MED ORDER — SILODOSIN 4 MG PO CAPS: 4.0000 mg | ORAL_CAPSULE | Freq: Every day | ORAL | 0 refills | Status: DC

## 2020-11-05 NOTE — Progress Notes (Signed)
11/05/2020 11:30 AM   Brian Little [birth year] [id number]  Referring provider: Adin Hector, MD [facility] [place],  [place] [address]  Chief Complaint  Patient presents with  . Benign Prostatic Hypertrophy    Urologic history: 1.BPH with lower urinary tract symptoms with nocturia -BothersomeLUTS;unable to tolerate tamsulosin and silodosin secondary to orthostatic changes/headache -No significant improvement on anticholinergic medication, DDAVP and Myrbetriq  2.History elevated PSA -Biopsy 1998 PSA 8.6 with benign pathology -Most recent PSA levels over 3/low 4 range   HPI: 86 y.o. male presents for annual follow-up.   Still with bothersome nocturia  Some daytime frequency and decreased stream though not bothersome  Denies dysuria, gross hematuria  No flank, abdominal or pelvic pain  IPSS 20/35   PMH: Past Medical History:  Diagnosis Date  . BPH (benign prostatic hyperplasia)   . Hypertension     Surgical History: Past Surgical History:  Procedure Laterality Date  . ADENOIDECTOMY    . ROTATOR CUFF REPAIR     x 2, 2008, 2011  . ROTATOR CUFF REPAIR     left and right  . TONSILLECTOMY     86 years old    Home Medications:  Allergies as of 11/05/2020      Reactions   Bee Venom Anaphylaxis   Caffeine Other (See Comments)   Other reaction(s): Other Vertigo vertigo   Ibuprofen    Other reaction(s): Other (See Comments), Other (See Comments) vertigo vertigo   Nsaids Other (See Comments)   Other reaction(s): Other (See Comments) Vertigo and n/v Vertigo and n/v      Medication List       Accurate as of November 05, 2020 11:30 AM. If you have any questions, ask your nurse or doctor.        amLODipine 10 MG tablet Commonly known as: NORVASC   aspirin 325 MG tablet Take by mouth.   Azelastine HCl 137 MCG/SPRAY Soln azelastine 137 mcg (0.1 %) nasal spray aerosol   cetirizine 10 MG  tablet Commonly known as: ZYRTEC Take by mouth.   chlorhexidine 0.12 % solution Commonly known as: PERIDEX chlorhexidine gluconate 0.12 % mouthwash   fluticasone 50 MCG/ACT nasal spray Commonly known as: FLONASE SPRAY 2 SPRAYS INTO EACH NOSTRIL NIGHTLY IF NEEDED   GNP Potassium 99 MG Tabs Generic drug: Potassium Take by mouth. Take one tab daily   hydrochlorothiazide 25 MG tablet Commonly known as: HYDRODIURIL TK 1 T PO  D   losartan 100 MG tablet Commonly known as: COZAAR   meclizine 25 MG tablet Commonly known as: ANTIVERT Take by mouth.   Potassium Gluconate 2.5 MEQ Tabs Take by mouth.   tadalafil 5 MG tablet Commonly known as: CIALIS TAKE 1 TABLET BY MOUTH DAILY   vitamin B-12 500 MCG tablet Commonly known as: CYANOCOBALAMIN Take 500 mcg by mouth daily.       Allergies:  Allergies  Allergen Reactions  . Bee Venom Anaphylaxis  . Caffeine Other (See Comments)    Other reaction(s): Other Vertigo vertigo   . Ibuprofen     Other reaction(s): Other (See Comments), Other (See Comments) vertigo vertigo   . Nsaids Other (See Comments)    Other reaction(s): Other (See Comments) Vertigo and n/v Vertigo and n/v     Family History: Family History  Problem Relation Age of Onset  . Heart disease Father   . Congestive Heart Failure Father   . COPD Mother   . Hypertension Mother  Social History:  reports that he quit smoking about 40 years ago. He has never used smokeless tobacco. He reports that he does not drink alcohol and does not use drugs.   Physical Exam: BP (!) 173/77   Pulse 74   Ht 5\' 11"  (1.803 m)   Wt 165 lb (74.8 kg)   BMI 23.01 kg/m   Constitutional:  Alert and oriented, No acute distress. HEENT: [place] AT, moist mucus membranes.  Trachea midline, no masses. Cardiovascular: No clubbing, cyanosis, or edema. Respiratory: Normal respiratory effort, no increased work of breathing. Skin: No rashes, bruises or suspicious  lesions. Neurologic: Grossly intact, no focal deficits, moving all 4 extremities. Psychiatric: Normal mood and affect.   Assessment & Plan:    1. Nocturia  We discussed unlikely an outlet procedure would improve his nocturia which is his most bothersome symptom  He was concerned about side effects of Nocdurna  He had dizziness and headache with tamsulosin and silodosin and is considering retrying  He was on 8 mg of low-dose and and could try 4 mg dose to see if there are less side effects  If there is efficacy and treatment of his nocturia was silodosin could consider an outlet procedure  Rx sent to pharmacy  Will call back in 1 month regarding efficacy   2. Benign prostatic hyperplasia with urinary obstruction  No bothersome daytime symptoms  Silodosin as above  Bladder scan PVR 30 mL    Abbie Sons, MD  [place] [address], [place], [place] [address] [phone]

## 2020-12-06 ENCOUNTER — Encounter: Payer: Self-pay | Admitting: Urology

## 2020-12-07 ENCOUNTER — Telehealth: Payer: Self-pay | Admitting: Family Medicine

## 2020-12-07 MED ORDER — SILODOSIN 4 MG PO CAPS: 4.0000 mg | ORAL_CAPSULE | Freq: Every day | ORAL | 1 refills | Status: DC

## 2020-12-07 NOTE — Telephone Encounter (Signed)
Patient states the Silodosin is working and wants a refill. I have sent the RX to the pharmacy.

## 2020-12-30 ENCOUNTER — Other Ambulatory Visit: Payer: Self-pay | Admitting: Urology

## 2021-02-03 ENCOUNTER — Other Ambulatory Visit: Payer: Self-pay | Admitting: Family

## 2021-02-03 DIAGNOSIS — N401 Enlarged prostate with lower urinary tract symptoms: Secondary | ICD-10-CM

## 2021-02-05 ENCOUNTER — Encounter: Payer: Self-pay | Admitting: Urology

## 2021-02-07 ENCOUNTER — Other Ambulatory Visit: Payer: Self-pay | Admitting: *Deleted

## 2021-02-09 ENCOUNTER — Other Ambulatory Visit: Payer: Self-pay

## 2021-02-09 MED ORDER — SILODOSIN 4 MG PO CAPS: 4.0000 mg | ORAL_CAPSULE | Freq: Every day | ORAL | 11 refills | Status: DC

## 2021-03-08 ENCOUNTER — Other Ambulatory Visit: Payer: Self-pay

## 2021-03-08 ENCOUNTER — Encounter: Payer: Self-pay | Admitting: Gastroenterology

## 2021-03-08 ENCOUNTER — Ambulatory Visit (INDEPENDENT_AMBULATORY_CARE_PROVIDER_SITE_OTHER): Payer: Medicare Other | Admitting: Gastroenterology

## 2021-03-08 VITALS — BP 114/65 | HR 77 | Ht 71.0 in | Wt 170.0 lb

## 2021-03-08 DIAGNOSIS — R197 Diarrhea, unspecified: Secondary | ICD-10-CM

## 2021-03-08 DIAGNOSIS — R140 Abdominal distension (gaseous): Secondary | ICD-10-CM | POA: Diagnosis not present

## 2021-03-08 NOTE — Progress Notes (Signed)
Gastroenterology Consultation  Referring Provider:     Adin Hector, MD Primary Care Physician:  Adin Hector, MD Primary Gastroenterologist:  Dr. Allen Norris     Reason for Consultation:     Change in bowel habits        HPI:   Brian Little is a 87 y.o. y/o male referred for consultation & management of Change in bowel habits by Dr. [name], Wendelyn Breslow III, MD.  This patient comes in today with a report of a change in bowel habits that presented when he started to take steroids and antibiotics.  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  The patient now reports that he was having soft stools a few times a day with the Bristol stool scale of his stools being between a 1 and a 3.  He states that recently they have been somewhat around 2-4.  There is not any black stools or bloody stools.  He does report that he is down approximately 4 pounds from his usual weight.  He states this is without trying.  The patient had a colonoscopy by Dr. Candace Cruise a few years ago without anything worrisome found.  He also has had some bloating and reports having gas.  He has tried a food log and did not see anything that was making his symptoms any better or worse.  He also reports that he went on Lactaid milk and avoid milk products without any change in his bowel habits.  Past Medical History:  Diagnosis Date  . BPH (benign prostatic hyperplasia)   . Hypertension     Past Surgical History:  Procedure Laterality Date  . ADENOIDECTOMY    . ROTATOR CUFF REPAIR     x 2, 2008, 2011  . ROTATOR CUFF REPAIR     left and right  . TONSILLECTOMY     87 years old    Prior to Admission medications   Medication Sig Start Date End Date Taking? Authorizing Provider  amLODipine (NORVASC) 10 MG tablet  05/06/18  Yes [provider]  aspirin 325 MG tablet Take by mouth.   Yes [provider]  cetirizine (ZYRTEC) 10 MG tablet Take by mouth. 01/21/13   Yes [provider]  chlorhexidine (PERIDEX) 0.12 % solution chlorhexidine gluconate 0.12 % mouthwash 10/06/14  Yes [provider]  hydrochlorothiazide (HYDRODIURIL) 25 MG tablet TK 1 T PO  D 05/05/18  Yes [provider]  losartan (COZAAR) 100 MG tablet  05/06/18  Yes [provider]  meclizine (ANTIVERT) 25 MG tablet Take by mouth. 08/02/15  Yes [provider]  Potassium 99 MG TABS Take by mouth. Take one tab daily   Yes [provider]  silodosin (RAPAFLO) 4 MG CAPS capsule Take 1 capsule (4 mg total) by mouth daily with breakfast. 02/09/21  Yes Stoioff, Ronda Fairly, MD  tadalafil (CIALIS) 5 MG tablet TAKE 1 TABLET BY MOUTH DAILY 07/28/20  Yes Ngetich, Dinah C, NP  vitamin B-12 (CYANOCOBALAMIN) 500 MCG tablet Take 500 mcg by mouth daily.   Yes [provider]    Family History  Problem Relation Age of Onset  . Heart disease Father   . Congestive Heart Failure Father   . COPD Mother   . Hypertension Mother      Social History   Tobacco Use  . Smoking status: Former Smoker    Quit date: 01/10/1980  Years since quitting: 41.1  . Smokeless tobacco: Never Used  Vaping Use  . Vaping Use: Never used  Substance Use Topics  . Alcohol use: Never  . Drug use: Never    Allergies as of 03/08/2021 - Review Complete 03/08/2021  Allergen Reaction Noted  . Bee venom Anaphylaxis 04/01/2015  . Caffeine Other (See Comments) 03/27/2013  . Ibuprofen  03/27/2013  . Nsaids Other (See Comments) 11/24/2014    Review of Systems:    All systems reviewed and negative except where noted in HPI.   Physical Exam:  BP 114/65   Pulse 77   Ht 5\' 11"  (1.803 m)   Wt 170 lb (77.1 kg)   BMI 23.71 kg/m  No LMP for male patient. General:   Alert,  Well-developed, well-nourished, pleasant and cooperative in NAD Head:  Normocephalic and atraumatic. Eyes:  Sclera clear, no icterus.   Conjunctiva pink. Ears:  Normal auditory acuity. Neck:   Supple; no masses or thyromegaly. Lungs:  Respirations even and unlabored.  Clear throughout to auscultation.   No wheezes, crackles, or rhonchi. No acute distress. Heart:  Regular rate and rhythm; no murmurs, clicks, rubs, or gallops. Abdomen:  Normal bowel sounds.  No bruits.  Soft, non-tender and non-distended without masses, hepatosplenomegaly or hernias noted.  No guarding or rebound tenderness.  Negative Carnett sign.   Rectal:  Deferred.  Pulses:  Normal pulses noted. Extremities:  No clubbing or edema.  No cyanosis. Neurologic:  Alert and oriented x3;  grossly normal neurologically. Skin:  Intact without significant lesions or rashes.  No jaundice. Lymph Nodes:  No significant cervical adenopathy. Psych:  Alert and cooperative. Normal mood and affect.  Imaging Studies: No results found.  Assessment and Plan:   Brian Little is a 87 y.o. y/o male who comes in today with a change in bowel habits that may have been related to him taking steroids and antibiotics in the past. The patient has been explained the pathophysiology of his colonic microbiology and with his diarrhea/loose stools and gas a culprit such as something being ingested can easily the culprit. Other possibilities include small bowel bacterial overgrowth versus pancreatic insufficiency.  The patient will have his stool sent off for Fecal elastase and he will also be set up for a breath test for bacterial overgrowth. The patient has been explained the plan and agrees with it.     Lucilla Lame, MD. [facility]    Note: This dictation was prepared with Dragon dictation along with smaller phrase technology. Any transcriptional errors that result from this process are unintentional.

## 2021-03-14 ENCOUNTER — Telehealth: Payer: Self-pay

## 2021-03-14 LAB — PANCREATIC ELASTASE, FECAL: Pancreatic Elastase, Fecal: >500 ug Elast./g (ref >200)

## 2021-03-14 NOTE — Telephone Encounter (Signed)
Patient has some question about the mail in test for the breath test  that was sent to his house and he sent back through [employer]

## 2021-03-15 ENCOUNTER — Telehealth: Payer: Self-pay

## 2021-03-15 NOTE — Telephone Encounter (Signed)
-----   Message from Lucilla Lame, MD sent at 03/14/2021  5:09 PM EDT ----- The patient know that he is still test showed his pancreas working well.  Remaining that he is not having malabsorption or pancreatic insufficiency as the cause of his problems.

## 2021-03-15 NOTE — Telephone Encounter (Signed)
Returned pt's call regarding questions about the requisition for the SIBO test. All questions answered.

## 2021-03-15 NOTE — Telephone Encounter (Signed)
Pt notified of lab results

## 2021-03-22 ENCOUNTER — Ambulatory Visit (INDEPENDENT_AMBULATORY_CARE_PROVIDER_SITE_OTHER): Payer: Medicare Other | Admitting: Physician Assistant

## 2021-03-22 ENCOUNTER — Other Ambulatory Visit: Payer: Self-pay

## 2021-03-22 ENCOUNTER — Encounter: Payer: Self-pay | Admitting: Physician Assistant

## 2021-03-22 VITALS — BP 153/71 | HR 88 | Ht 71.0 in | Wt 163.0 lb

## 2021-03-22 DIAGNOSIS — N451 Epididymitis: Secondary | ICD-10-CM

## 2021-03-22 LAB — URINALYSIS, COMPLETE
Bilirubin, UA: NEGATIVE
Glucose, UA: NEGATIVE
Ketones, UA: NEGATIVE
Leukocytes,UA: NEGATIVE
Nitrite, UA: NEGATIVE
Protein,UA: NEGATIVE
RBC, UA: NEGATIVE
Specific Gravity, UA: 1.015 (ref 1.005–1.030)
Urobilinogen, Ur: 0.2 mg/dL (ref 0.2–1.0)
pH, UA: 7 (ref 5.0–7.5)

## 2021-03-22 LAB — MICROSCOPIC EXAMINATION
Bacteria, UA: NONE SEEN
Epithelial Cells (non renal): NONE SEEN /hpf (ref 0–10)

## 2021-03-22 MED ORDER — LEVOFLOXACIN 500 MG PO TABS: 500.0000 mg | ORAL_TABLET | Freq: Every day | ORAL | 0 refills | Status: DC

## 2021-03-22 NOTE — Patient Instructions (Signed)
I'm prescribing 10 days of antibiotics today for management of epididymitis. If you begin to feel worse: fever, chills, nausea, vomiting, please go to the Emergency Department or contact our office immediately.  If you do not feel better after completing your antibiotics, please contact our office and we will proceed with an ultrasound of your scrotum.

## 2021-03-22 NOTE — Progress Notes (Signed)
03/22/2021 2:07 PM   Brian Little September 19, 1934 [id number]  CC: Chief Complaint  Patient presents with  . Groin Swelling    HPI: Brian Little is a 87 y.o. male with PMH BPH with LUTS on silodosin, nocturia, ED on tadalafil 5 mg, and elevated PSA who presents today for evaluation of right scrotal swelling.  Today he reports a 4 to 5-day history of right testicular pain, swelling, and redness.  He denies dysuria, fever, chills, nausea, and vomiting.  He denies recent sexual contacts.  In-office UA and microscopy today pan negative.  PMH: Past Medical History:  Diagnosis Date  . BPH (benign prostatic hyperplasia)   . Hypertension     Surgical History: Past Surgical History:  Procedure Laterality Date  . ADENOIDECTOMY    . ROTATOR CUFF REPAIR     x 2, 2008, 2011  . ROTATOR CUFF REPAIR     left and right  . TONSILLECTOMY     87 years old    Home Medications:  Allergies as of 03/22/2021      Reactions   Bee Venom Anaphylaxis   Caffeine Other (See Comments)   Other reaction(s): Other Vertigo vertigo   Ibuprofen    Other reaction(s): Other (See Comments), Other (See Comments) vertigo vertigo   Nsaids Other (See Comments)   Other reaction(s): Other (See Comments) Vertigo and n/v Vertigo and n/v      Medication List       Accurate as of Mar 22, 2021  2:07 PM. If you have any questions, ask your nurse or doctor.        amLODipine 10 MG tablet Commonly known as: NORVASC   aspirin 325 MG tablet Take by mouth.   cetirizine 10 MG tablet Commonly known as: ZYRTEC Take by mouth.   chlorhexidine 0.12 % solution Commonly known as: PERIDEX chlorhexidine gluconate 0.12 % mouthwash   hydrochlorothiazide 25 MG tablet Commonly known as: HYDRODIURIL TK 1 T PO  D   losartan 100 MG tablet Commonly known as: COZAAR   meclizine 25 MG tablet Commonly known as: ANTIVERT Take by mouth.   Potassium 99 MG Tabs Take by mouth. Take one tab daily   silodosin 4  MG Caps capsule Commonly known as: RAPAFLO Take 1 capsule (4 mg total) by mouth daily with breakfast.   tadalafil 5 MG tablet Commonly known as: CIALIS TAKE 1 TABLET BY MOUTH DAILY   vitamin B-12 500 MCG tablet Commonly known as: CYANOCOBALAMIN Take 500 mcg by mouth daily.       Allergies:  Allergies  Allergen Reactions  . Bee Venom Anaphylaxis  . Caffeine Other (See Comments)    Other reaction(s): Other Vertigo vertigo   . Ibuprofen     Other reaction(s): Other (See Comments), Other (See Comments) vertigo vertigo   . Nsaids Other (See Comments)    Other reaction(s): Other (See Comments) Vertigo and n/v Vertigo and n/v     Family History: Family History  Problem Relation Age of Onset  . Heart disease Father   . Congestive Heart Failure Father   . COPD Mother   . Hypertension Mother     Social History:   reports that he quit smoking about 41 years ago. He has never used smokeless tobacco. He reports that he does not drink alcohol and does not use drugs.  Physical Exam: BP (!) 153/71   Pulse 88   Ht 5\' 11"  (1.803 m)   Wt 163 lb (73.9 kg)   BMI  22.73 kg/m   Constitutional:  Alert and oriented, no acute distress, nontoxic appearing HEENT: [place], AT Cardiovascular: No clubbing, cyanosis, or edema Respiratory: Normal respiratory effort, no increased work of breathing GU: Bilateral descended testicles.  Edematous right testicle with palpable, tender right epididymis.  No crepitus or purulence of the scrotum noted Skin: No rashes, bruises or suspicious lesions Neurologic: Grossly intact, no focal deficits, moving all 4 extremities Psychiatric: Normal mood and affect  Laboratory Data: Results for orders placed or performed in visit on 03/22/21  Microscopic Examination   Urine  Result Value Ref Range   WBC, UA 0-5 0 - 5 /hpf   RBC 0-2 0 - 2 /hpf   Epithelial Cells (non renal) None seen 0 - 10 /hpf   Bacteria, UA None seen None seen/Few  Urinalysis, Complete   Result Value Ref Range   Specific Gravity, UA 1.015 1.005 - 1.030   pH, UA 7.0 5.0 - 7.5   Color, UA Yellow Yellow   Appearance Ur Clear Clear   Leukocytes,UA Negative Negative   Protein,UA Negative Negative/Trace   Glucose, UA Negative Negative   Ketones, UA Negative Negative   RBC, UA Negative Negative   Bilirubin, UA Negative Negative   Urobilinogen, Ur 0.2 0.2 - 1.0 mg/dL   Nitrite, UA Negative Negative   Microscopic Examination See below:    Assessment & Plan:   1. Epididymitis, right 4 to 5 days of right testicular swelling, pain, and redness with an enlarged, tender epididymis on physical exam today.  No indication for stat scrotal ultrasound today given duration of symptoms.  We will treat with empiric levofloxacin for management of right epididymitis.  I counseled the patient to follow-up in clinic if his symptoms worsen or do not improve, at which point we will proceed with scrotal ultrasound.  He expressed understanding. - Urinalysis, Complete - levofloxacin (LEVAQUIN) 500 MG tablet; Take 1 tablet (500 mg total) by mouth daily.  Dispense: 10 tablet; Refill: 0 - CULTURE, URINE COMPREHENSIVE - Ct Ng M genitalium NAA, Urine   Return if symptoms worsen or fail to improve.  Debroah Loop, PA-C  [facility] [address] [place], [place] [address] [phone]

## 2021-03-24 ENCOUNTER — Telehealth: Payer: Self-pay

## 2021-03-24 NOTE — Telephone Encounter (Signed)
Dominica Severin called to say he sent the results of the patients SIBO test. Wants Dr. Allen Norris to call him if he has questions about the results. [phone]

## 2021-03-26 LAB — CULTURE, URINE COMPREHENSIVE

## 2021-03-30 LAB — CT NG M GENITALIUM NAA, URINE
Chlamydia trachomatis, NAA: NEGATIVE
Mycoplasma genitalium NAA: NEGATIVE
Neisseria gonorrhoeae, NAA: NEGATIVE

## 2021-03-31 ENCOUNTER — Telehealth: Payer: Self-pay

## 2021-03-31 NOTE — Telephone Encounter (Signed)
-----   Message from Debroah Loop, [place] sent at 03/31/2021  1:20 PM EDT ----- Please see if his symptoms resolved on antibiotics. If not, we need to order a scrotal US with doppler for further evaluation in the setting of negative cultures. ----- Message ----- From: Lavone Neri Lab Results In Sent: 03/22/2021   4:37 PM EDT To: Debroah Loop, PA-C

## 2021-03-31 NOTE — Telephone Encounter (Signed)
LMOM for patient to return call. See note below.  

## 2021-04-01 NOTE — Telephone Encounter (Signed)
Patient returned call and states he is doing much better. He finished his last ABX yesterday.

## 2021-04-29 ENCOUNTER — Other Ambulatory Visit: Payer: Self-pay | Admitting: *Deleted

## 2021-04-29 DIAGNOSIS — N401 Enlarged prostate with lower urinary tract symptoms: Secondary | ICD-10-CM

## 2021-04-29 MED ORDER — TADALAFIL 5 MG PO TABS: 5.0000 mg | ORAL_TABLET | Freq: Every day | ORAL | 1 refills | Status: DC

## 2021-04-29 NOTE — Telephone Encounter (Signed)
Ok to fill 

## 2021-05-02 ENCOUNTER — Other Ambulatory Visit: Payer: Self-pay | Admitting: Family Medicine

## 2021-05-02 DIAGNOSIS — N401 Enlarged prostate with lower urinary tract symptoms: Secondary | ICD-10-CM

## 2021-05-02 MED ORDER — TADALAFIL 5 MG PO TABS: 5.0000 mg | ORAL_TABLET | Freq: Every day | ORAL | 0 refills | Status: DC

## 2021-07-11 ENCOUNTER — Other Ambulatory Visit: Payer: Self-pay | Admitting: Urology

## 2021-07-11 ENCOUNTER — Other Ambulatory Visit: Payer: Self-pay

## 2021-07-11 DIAGNOSIS — N401 Enlarged prostate with lower urinary tract symptoms: Secondary | ICD-10-CM

## 2021-07-11 MED ORDER — TADALAFIL 5 MG PO TABS: 5.0000 mg | ORAL_TABLET | Freq: Every day | ORAL | 0 refills | Status: DC

## 2021-08-12 ENCOUNTER — Other Ambulatory Visit: Payer: Self-pay | Admitting: Urology

## 2021-08-12 ENCOUNTER — Other Ambulatory Visit: Payer: Self-pay | Admitting: *Deleted

## 2021-08-12 DIAGNOSIS — N401 Enlarged prostate with lower urinary tract symptoms: Secondary | ICD-10-CM

## 2021-08-12 MED ORDER — TADALAFIL 5 MG PO TABS: 5.0000 mg | ORAL_TABLET | Freq: Every day | ORAL | 0 refills | Status: DC

## 2021-08-18 ENCOUNTER — Telehealth: Payer: Self-pay | Admitting: *Deleted

## 2021-08-18 NOTE — Telephone Encounter (Signed)
He has not been to his pharmacy to see how much the medication is yet. He told patient to call us back if he needs anything. Also he can get the medication from [facility] for 18.50 90 day supply.

## 2021-08-18 NOTE — Telephone Encounter (Signed)
Pt called back asking for a different medication.

## 2021-08-18 NOTE — Telephone Encounter (Signed)
Patient [employer] was denied for  Tadalafil 5 mg . Left message for patient.

## 2021-11-09 ENCOUNTER — Ambulatory Visit: Payer: Medicare Other | Admitting: Urology

## 2021-11-16 ENCOUNTER — Encounter: Payer: Self-pay | Admitting: Urology

## 2021-11-16 ENCOUNTER — Other Ambulatory Visit: Payer: Self-pay

## 2021-11-16 ENCOUNTER — Ambulatory Visit (INDEPENDENT_AMBULATORY_CARE_PROVIDER_SITE_OTHER): Payer: Medicare Other | Admitting: Urology

## 2021-11-16 VITALS — BP 131/71 | HR 4 | Ht 70.0 in | Wt 162.0 lb

## 2021-11-16 DIAGNOSIS — R351 Nocturia: Secondary | ICD-10-CM

## 2021-11-16 DIAGNOSIS — N401 Enlarged prostate with lower urinary tract symptoms: Secondary | ICD-10-CM

## 2021-11-16 LAB — URINALYSIS, COMPLETE
Bilirubin, UA: NEGATIVE
Glucose, UA: NEGATIVE
Ketones, UA: NEGATIVE
Leukocytes,UA: NEGATIVE
Nitrite, UA: NEGATIVE
Protein,UA: NEGATIVE
RBC, UA: NEGATIVE
Specific Gravity, UA: 1.015 (ref 1.005–1.030)
Urobilinogen, Ur: 0.2 mg/dL (ref 0.2–1.0)
pH, UA: 7 (ref 5.0–7.5)

## 2021-11-16 LAB — MICROSCOPIC EXAMINATION
Bacteria, UA: NONE SEEN
RBC, Urine: NONE SEEN /hpf (ref 0–2)

## 2021-11-16 MED ORDER — TADALAFIL 5 MG PO TABS: 5.0000 mg | ORAL_TABLET | Freq: Every day | ORAL | 3 refills | Status: DC

## 2021-11-16 MED ORDER — SILODOSIN 4 MG PO CAPS: 4.0000 mg | ORAL_CAPSULE | Freq: Every day | ORAL | 3 refills | Status: DC

## 2021-11-16 NOTE — Progress Notes (Signed)
11/16/2021 8:39 AM   Brian Little 1934/08/06 [id number]  Referring provider: Adin Hector, MD [facility] [place],  [place] [address]  Chief Complaint  Patient presents with   Nocturia    Urologic history: 1.  BPH with lower urinary tract symptoms with nocturia -Bothersome LUTS; unable to tolerate tamsulosin and silodosin secondary to orthostatic changes/headache -No significant improvement on anticholinergic medication, DDAVP and Myrbetriq -Presently on silodosin since 4 mg/tadalafil 5 mg daily   2.  History elevated PSA -Biopsy 1998 PSA 8.6 with benign pathology -Most recent PSA levels over 3/low 4 range  HPI: 87 y.o. male presents for annual follow-up.  Saw Brian Little May 2022 for epididymitis Urine culture was negative Scrotal pain has resolved Stable LUTS on silodosin and tadalafil Nocturia x3-4 Presently satisfied with his voiding pattern   PMH: Past Medical History:  Diagnosis Date   BPH (benign prostatic hyperplasia)    Hypertension     Surgical History: Past Surgical History:  Procedure Laterality Date   ADENOIDECTOMY     ROTATOR CUFF REPAIR     x 2, 2008, 2011   ROTATOR CUFF REPAIR     left and right   TONSILLECTOMY     87 years old    Home Medications:  Allergies as of 11/16/2021       Reactions   Bee Venom Anaphylaxis   Caffeine Other (See Comments)   Other reaction(s): Other Vertigo vertigo   Ibuprofen    Other reaction(s): Other (See Comments), Other (See Comments) vertigo vertigo   Nsaids Other (See Comments)   Other reaction(s): Other (See Comments) Vertigo and n/v Vertigo and n/v        Medication List        Accurate as of November 16, 2021  8:39 AM. If you have any questions, ask your nurse or doctor.          STOP taking these medications    chlorhexidine 0.12 % solution Commonly known as: PERIDEX Stopped by: Abbie Sons, MD   levofloxacin 500 MG  tablet Commonly known as: LEVAQUIN Stopped by: Abbie Sons, MD   Potassium 99 MG Tabs Stopped by: Abbie Sons, MD       TAKE these medications    amLODipine 10 MG tablet Commonly known as: NORVASC   aspirin 325 MG tablet Take by mouth.   cetirizine 10 MG tablet Commonly known as: ZYRTEC Take by mouth.   hydrALAZINE 25 MG tablet Commonly known as: APRESOLINE Take 25 mg by mouth 2 (two) times daily.   hydrochlorothiazide 25 MG tablet Commonly known as: HYDRODIURIL TK 1 T PO  D   losartan 100 MG tablet Commonly known as: COZAAR   meclizine 25 MG tablet Commonly known as: ANTIVERT Take by mouth.   montelukast 10 MG tablet Commonly known as: SINGULAIR Take 1 tablet by mouth daily.   silodosin 4 MG Caps capsule Commonly known as: RAPAFLO Take 1 capsule (4 mg total) by mouth daily with breakfast.   tadalafil 5 MG tablet Commonly known as: CIALIS Take 1 tablet (5 mg total) by mouth daily.   vitamin B-12 500 MCG tablet Commonly known as: CYANOCOBALAMIN Take 500 mcg by mouth daily.        Allergies:  Allergies  Allergen Reactions   Bee Venom Anaphylaxis   Caffeine Other (See Comments)    Other reaction(s): Other Vertigo vertigo    Ibuprofen     Other reaction(s): Other (See  Comments), Other (See Comments) vertigo vertigo    Nsaids Other (See Comments)    Other reaction(s): Other (See Comments) Vertigo and n/v Vertigo and n/v     Family History: Family History  Problem Relation Age of Onset   Heart disease Father    Congestive Heart Failure Father    COPD Mother    Hypertension Mother     Social History:  reports that he quit smoking about 41 years ago. His smoking use included cigarettes. He has never used smokeless tobacco. He reports that he does not drink alcohol and does not use drugs.   Physical Exam: BP 131/71    Pulse (!) 4    Ht 5\' 10"  (1.778 m)    Wt 162 lb (73.5 kg)    BMI 23.24 kg/m   Constitutional:  Alert and  oriented, No acute distress. HEENT: [place] AT, moist mucus membranes.  Trachea midline, no masses. Cardiovascular: No clubbing, cyanosis, or edema. Respiratory: Normal respiratory effort, no increased work of breathing. Psychiatric: Normal mood and affect.   Assessment & Plan:    1.  BPH with LUTS Stable Silodosin/tadalafil refilled Continue annual follow-up  2.  Nocturia Stable  Abbie Sons, MD  [facility] [address] [place], [place] [address] [phone]

## 2021-12-04 ENCOUNTER — Encounter: Payer: Self-pay | Admitting: Emergency Medicine

## 2021-12-04 ENCOUNTER — Emergency Department: Payer: Medicare Other

## 2021-12-04 ENCOUNTER — Emergency Department
Admission: EM | Admit: 2021-12-04 | Discharge: 2021-12-04 | Disposition: A | Discharge status: Home / Self Care | Payer: Medicare Other | Attending: Emergency Medicine | Admitting: Emergency Medicine

## 2021-12-04 ENCOUNTER — Other Ambulatory Visit: Payer: Self-pay

## 2021-12-04 DIAGNOSIS — Z85828 Personal history of other malignant neoplasm of skin: Secondary | ICD-10-CM | POA: Insufficient documentation

## 2021-12-04 DIAGNOSIS — I10 Essential (primary) hypertension: Secondary | ICD-10-CM | POA: Insufficient documentation

## 2021-12-04 DIAGNOSIS — R55 Syncope and collapse: Secondary | ICD-10-CM | POA: Diagnosis present

## 2021-12-04 LAB — BASIC METABOLIC PANEL
Anion gap: 9 (ref 5–15)
BUN: 12 mg/dL (ref 8–23)
CO2: 28 mmol/L (ref 22–32)
Calcium: 9.1 mg/dL (ref 8.9–10.3)
Chloride: 99 mmol/L (ref 98–111)
Creatinine, Ser: 1.27 mg/dL — ABNORMAL HIGH (ref 0.61–1.24)
GFR, Estimated: 55 mL/min — ABNORMAL LOW (ref >60)
Glucose, Bld: 148 mg/dL — ABNORMAL HIGH (ref 70–99)
Potassium: 3.2 mmol/L — ABNORMAL LOW (ref 3.5–5.1)
Sodium: 136 mmol/L (ref 135–145)

## 2021-12-04 LAB — URINALYSIS, ROUTINE W REFLEX MICROSCOPIC
Bilirubin Urine: NEGATIVE
Glucose, UA: NEGATIVE mg/dL
Hgb urine dipstick: NEGATIVE
Ketones, ur: NEGATIVE mg/dL
Leukocytes,Ua: NEGATIVE
Nitrite: NEGATIVE
Protein, ur: NEGATIVE mg/dL
Specific Gravity, Urine: 1.015 (ref 1.005–1.030)
pH: 7.5 (ref 5.0–8.0)

## 2021-12-04 LAB — CBC
HCT: 45.7 % (ref 39.0–52.0)
Hemoglobin: 15.4 g/dL (ref 13.0–17.0)
MCH: 31.4 pg (ref 26.0–34.0)
MCHC: 33.7 g/dL (ref 30.0–36.0)
MCV: 93.1 fL (ref 80.0–100.0)
Platelets: 160 10*3/uL (ref 150–400)
RBC: 4.91 MIL/uL (ref 4.22–5.81)
RDW: 13.3 % (ref 11.5–15.5)
WBC: 8.4 10*3/uL (ref 4.0–10.5)
nRBC: 0 % (ref 0.0–0.2)

## 2021-12-04 LAB — TROPONIN I (HIGH SENSITIVITY)
Troponin I (High Sensitivity): 18 ng/L — ABNORMAL HIGH (ref <18)
Troponin I (High Sensitivity): 16 ng/L (ref <18)

## 2021-12-04 MED ORDER — POTASSIUM CHLORIDE CRYS ER 20 MEQ PO TBCR
40.0000 meq | EXTENDED_RELEASE_TABLET | Freq: Once | ORAL | Status: AC
  Administered 2021-12-04: 40 meq via ORAL
  Filled 2021-12-04: qty 2

## 2021-12-04 NOTE — Discharge Instructions (Signed)
Your blood work was all reassuring.  Your potassium was mildly low.  Please have this rechecked in about 1 week.  Your cardiac enzymes were unchanged over 2 hours which is reassuring.  You may have lost consciousness related to your blood pressure medication.  Please make sure you are drinking enough water.  Please return to the emergency department if you have any recurrent episodes or experience any chest pain or difficulty breathing.

## 2021-12-04 NOTE — ED Notes (Signed)
Family at bedside. Patient is alert and oriented x4. No signs of distress. Patient denies any pain from the fall.

## 2021-12-04 NOTE — ED Triage Notes (Addendum)
Pt reports he is not sure what happened. All he knows is he was standing in line, yawned and then must have passed out. Pt reports people around him helped him to a chair while they waited on EMS. Pt denies pain, SOB or other concerns. Pt reports has been working with Dr. [name] to adjust his hydralazine. Pt reports about 5 days ago that med has been increased. Pt reports had a dizzy spell several weeks ago as well but the blood work only showed dehydration.

## 2021-12-04 NOTE — ED Triage Notes (Addendum)
Pt in via EMS from [place]. EMS reports pt was standing in line, his knee locked up and he passed out. Bystanders assisted him to the floor. EMS reports upon their arrival he was awake.FSBS 135.

## 2021-12-04 NOTE — ED Provider Notes (Signed)
[facility] Provider Note    Event Date/Time   First MD Initiated Contact with Patient 12/04/21 1100     (approximate)   History   Loss of Consciousness   HPI  Brian Little is a 88 y.o. male   with a past medical history of hypertension who presents after syncopal episode.  Patient was feeling well this morning, went to basketball and was standing in line only for short period of time when he had a syncopal episode.  Did not have any prodrome of lightheadedness shortness of breath or chest pain or palpitations.  When he woke he felt back to baseline.  He does have some mild lightheadedness.  Notes that last night he had 2 soft bowel movements and increased urination throughout the night but denies dysuria.  No fevers chills.  Denies chest pain or shortness of breath.  Patient has been having his blood pressure medications adjusted.  Initially the HCTZ was cut in half, blood pressure was then running somewhat high at home so his hydralazine was increased from 25 to 50 mg 3 times daily.  Has been seeing his primary care provider and they did note that he has his labs looked somewhat dehydrated several days ago.  Has been trying to hydrate.     Past Medical History:  Diagnosis Date   BPH (benign prostatic hyperplasia)    Hypertension     Patient Active Problem List   Diagnosis Date Noted   Lung nodule seen on imaging study 12/16/2018   Claudication ([place]) 11/01/2018   Peripheral eosinophilia 11/01/2018   Polyneuropathy due to medical condition ([place]) 11/01/2018   Impingement syndrome of shoulder region 09/26/2017   Aortic atherosclerosis ([place]) 02/23/2016   Hyperlipidemia 11/24/2014   Essential hypertension 11/24/2014   Gout 11/24/2014   Malignant neoplasm of skin 11/24/2014   Nocturia 11/24/2014   Osteoarthritis 11/24/2014   Vertigo 11/24/2014   Benign prostatic hyperplasia with urinary obstruction 01/21/2013   Elevated prostate specific antigen (PSA)  01/21/2013   Male erectile dysfunction 01/21/2013   Polyp of colon 10/22/2001     Physical Exam  Triage Vital Signs: ED Triage Vitals [12/04/21 0941]  Enc Vitals Group     BP      Pulse      Resp      Temp      Temp src      SpO2      Weight 161 lb (73 kg)     Height 5\' 10"  (1.778 m)     Head Circumference      Peak Flow      Pain Score 0     Pain Loc      Pain Edu?      Excl. in [place]?     Most recent vital signs: Vitals:   12/04/21 1130 12/04/21 1255  BP: (!) 163/79 (!) 145/77  Pulse: 68 (!) 55  Resp:  16  Temp:    SpO2: 97% 96%     General: Awake, no distress.  CV:  Good peripheral perfusion.  No murmur Resp:  Normal effort.  Lungs are clear  abd:  No distention.  Neuro:             Awake, Alert, Oriented x 3  Other:     ED Results / Procedures / Treatments  Labs (all labs ordered are listed, but only abnormal results are displayed) Labs Reviewed  BASIC METABOLIC PANEL - Abnormal; Notable for the following components:  Result Value   Potassium 3.2 (*)    Glucose, Bld 148 (*)    Creatinine, Ser 1.27 (*)    GFR, Estimated 55 (*)    All other components within normal limits  TROPONIN I (HIGH SENSITIVITY) - Abnormal; Notable for the following components:   Troponin I (High Sensitivity) 18 (*)    All other components within normal limits  CBC  URINALYSIS, ROUTINE W REFLEX MICROSCOPIC  TROPONIN I (HIGH SENSITIVITY)     EKG  EKG interpreted by myself, sinus bradycardia with first-degree AV block, no acute ischemic changes, normal axis   RADIOLOGY I reviewed the CT scan of the brain which does not show any acute intracranial process; agree with radiology report     PROCEDURES:  Critical Care performed: No  Procedures  The patient is on the cardiac monitor to evaluate for evidence of arrhythmia and/or significant heart rate changes.   MEDICATIONS ORDERED IN ED: Medications  potassium chloride SA (KLOR-CON M) CR tablet 40 mEq (40 mEq  Oral Given 12/04/21 1201)     IMPRESSION / MDM / ASSESSMENT AND PLAN / ED COURSE  I reviewed the triage vital signs and the nursing notes.                              Differential diagnosis includes, but is not limited to, vasovagal episode, orthostatic syncope, arrhythmia  Patient is an 88 year old male presents after syncopal episode.  Occurred while he was standing in line and there was no prodrome.  He has no associated chest pain or shortness of breath has had some lightheadedness afterward.  Of note his blood pressure medication has been being adjusted and recently they went up on the hydralazine.  Has had some mild lightheadedness at home.  Other than increased urination last night he is otherwise not had any acute infectious or other ischemic symptoms.  No frequent syncope.  Had 1 syncopal episode 3 years ago in the setting of working out in the heat.  On exam he appears well no cardiac murmurs, no signs of volume overload.  I reviewed his EKG which shows sinus bradycardia with first-degree AV block but no other concerning interval abnormalities or signs of ischemia.  His high-sensitivity troponin is 18 which is just mildly elevated.  Creatinine is actually improved from 11 days ago at 1.27.  CBC unremarkable.  We will plan to repeat the troponin at 2 hours and obtain a UA given the increased frequency of urination last night to rule out UTI.  Overall given he is back to baseline with normal vital signs I do think he is appropriate for outpatient management. Clinical Course as of 12/04/21 1630  Sun Dec 04, 2021  1249 Troponin I (High Sensitivity): 16 [KM]    Clinical Course User Index [KM] Rada Hay, MD     FINAL CLINICAL IMPRESSION(S) / ED DIAGNOSES   Final diagnoses:  Syncope and collapse     Rx / DC Orders   ED Discharge Orders     None        Note:  This document was prepared using Dragon voice recognition software and may include unintentional dictation  errors.   Rada Hay, MD 12/04/21 1630

## 2022-01-02 ENCOUNTER — Other Ambulatory Visit: Payer: Self-pay

## 2022-01-02 DIAGNOSIS — I6529 Occlusion and stenosis of unspecified carotid artery: Secondary | ICD-10-CM

## 2022-01-02 NOTE — Progress Notes (Unsigned)
[facility] [place]  ASSESSMENT / PLAN: Brian Little is a 88 y.o. male with ***symptomatic {DESC; RIGHT/LEFT/BILATERAL:23152} {Vasm carotid art exam %:30973} carotid artery stenosis.   The patient's carotid artery stenosis merits consideration of revascularization to reduce the risk of future stroke because of {carotidindications:24836}. I quoted the patient risk reduction from intervention of {carotidpercentages:24837}.  *** The patient is a good candidate for carotid endarterectomy because of expected longevity (age <75 years, >5 years anticipated survival), physiologic fitness (able to perform > 4 METs), and anatomic suitability of the lesion to open surgery.  *** The patient is a good candidate for transcarotid artery revascularization (TCAR) because of {TCARindications:24838}. The patient has no exclusion factors to TCAR including cardiac emboli, atrial fibrillation or MI < 72 hours; recently implanted heart valve; major surgery <30 days; evolving stroke; spontaneous ICH < 12 months; recent stroke < 7 days; TIA or amaurosis fugax < 48 hours; severe dementia; bleeding disorders; life expectancy < 12 months; previous stent in target vessel; CCA/ICA occlusion or string sign; ipsilateral intracranial or extracranial stenosis; severe ostial lesion; previous intervention to proximal CCA; CCA disease at access site; < 5cm from clavicle to carotid bifurcation; CCA < 61mm in diameter; contralateral lateral recurrent, laryngeal, or vagus injury.  *** I do not think the patient is a candidate for any type of intervention for carotid artery stenosis because of ***.   The patient should continue best medical therapy for carotid artery stenosis including: Complete cessation from all tobacco products. Blood glucose control with goal A1c < 7%. Blood pressure control with goal blood pressure < 140/90 mmHg. Lipid reduction therapy with goal LDL-C <100 mg/dL (<70 if symptomatic from  carotid artery stenosis).  Aspirin 81mg  PO QD.  *** Clopidogrel 75mg  PO QD. Atorvastatin 40-80mg  PO QD (or other "high intensity" statin therapy). *** The addition of ezetimibe or PCSK9 inhibitors may benefit patients with difficult to control hypercholesterolemia.   CHIEF COMPLAINT: ***  HISTORY OF PRESENT ILLNESS: Brian Little is a 88 y.o. male ***  VASCULAR SURGICAL HISTORY: ***  VASCULAR RISK FACTORS: {FINDINGS; POSITIVE NEGATIVE:[id number]} history of stroke / transient ischemic attack. {FINDINGS; POSITIVE NEGATIVE:[phone]} history of coronary artery disease. *** history of PCI. *** history of CABG.  {FINDINGS; POSITIVE NEGATIVE:[phone]} history of diabetes mellitus. Last A1c ***. {FINDINGS; POSITIVE NEGATIVE:[id number]} history of smoking. *** actively smoking. {FINDINGS; POSITIVE NEGATIVE:[phone]} history of hypertension. *** drug regimen with *** control. {FINDINGS; POSITIVE NEGATIVE:[phone]} history of chronic kidney disease.  Last GFR ***. CKD {stage:30421363}. {FINDINGS; POSITIVE NEGATIVE:[phone]} history of chronic obstructive pulmonary disease, treated with ***.  FUNCTIONAL STATUS: ECOG performance status: {findings; ecog performance status:31780} Ambulatory status: {TNHAmbulation:25868}  Past Medical History:  Diagnosis Date   BPH (benign prostatic hyperplasia)    Hypertension     Past Surgical History:  Procedure Laterality Date   ADENOIDECTOMY     ROTATOR CUFF REPAIR     x 2, 2008, 2011   ROTATOR CUFF REPAIR     left and right   TONSILLECTOMY     88 years old    Family History  Problem Relation Age of Onset   Heart disease Father    Congestive Heart Failure Father    COPD Mother    Hypertension Mother     Social History   Socioeconomic History   Marital status: Married    Spouse name: Not on file   Number of children: Not on file   Years of education: Not on file   Highest education  level: Not on file  Occupational History    Not on file  Tobacco Use   Smoking status: Former    Types: Cigarettes    Quit date: 01/10/1980    Years since quitting: 42.0   Smokeless tobacco: Never  Vaping Use   Vaping Use: Never used  Substance and Sexual Activity   Alcohol use: Never   Drug use: Never   Sexual activity: Yes    Birth control/protection: None  Other Topics Concern   Not on file  Social History Narrative   Not on file   Social Determinants of Health   Financial Resource Strain: Not on file  Food Insecurity: Not on file  Transportation Needs: Not on file  Physical Activity: Not on file  Stress: Not on file  Social Connections: Not on file  Intimate Partner Violence: Not on file    Allergies  Allergen Reactions   Bee Venom Anaphylaxis   Caffeine Other (See Comments)    Other reaction(s): Other Vertigo vertigo    Ibuprofen     Other reaction(s): Other (See Comments), Other (See Comments) vertigo vertigo    Nsaids Other (See Comments)    Other reaction(s): Other (See Comments) Vertigo and n/v Vertigo and n/v     Current Outpatient Medications  Medication Sig Dispense Refill   amLODipine (NORVASC) 10 MG tablet   2   aspirin 325 MG tablet Take by mouth.     azelastine (ASTELIN) 0.1 % nasal spray Place 1-2 sprays into both nostrils in the morning and at bedtime.     cetirizine (ZYRTEC) 10 MG tablet Take by mouth.     fluticasone (FLONASE) 50 MCG/ACT nasal spray Place 1 spray into both nostrils 2 (two) times daily.     hydrALAZINE (APRESOLINE) 25 MG tablet Take 25 mg by mouth 2 (two) times daily.     hydrochlorothiazide (HYDRODIURIL) 25 MG tablet TK 1 T PO  D  3   losartan (COZAAR) 100 MG tablet   2   meclizine (ANTIVERT) 25 MG tablet Take by mouth.     montelukast (SINGULAIR) 10 MG tablet Take 1 tablet by mouth daily.     silodosin (RAPAFLO) 4 MG CAPS capsule Take 1 capsule (4 mg total) by mouth daily with breakfast. 90 capsule 3   tadalafil (CIALIS) 5 MG tablet Take 1 tablet (5 mg  total) by mouth daily. 90 tablet 3   traZODone (DESYREL) 50 MG tablet Take 50-100 mg by mouth at bedtime.     vitamin B-12 (CYANOCOBALAMIN) 500 MCG tablet Take 500 mcg by mouth daily.     No current facility-administered medications for this visit.    PHYSICAL EXAM There were no vitals filed for this visit.  Constitutional: *** appearing. *** distress. Appears *** nourished.  Neurologic: CN ***. *** focal findings. *** sensory loss. Psychiatric: *** Mood and affect symmetric and appropriate. Eyes: *** No icterus. No conjunctival pallor. Ears, nose, throat: *** mucous membranes moist. Midline trachea.  Cardiac: *** rate and rhythm.  Respiratory: *** unlabored. Abdominal: *** soft, non-tender, non-distended.  Peripheral vascular: *** Extremity: *** edema. *** cyanosis. *** pallor.  Skin: *** gangrene. *** ulceration.  Lymphatic: *** Stemmer's sign. *** palpable lymphadenopathy.  PERTINENT LABORATORY AND RADIOLOGIC DATA  Most recent CBC CBC Latest Ref Rng & Units 12/04/2021 08/12/2020 01/09/2019  WBC 4.0 - 10.5 K/uL 8.4 8.0 9.4  Hemoglobin 13.0 - 17.0 g/dL 15.4 15.4 14.9  Hematocrit 39.0 - 52.0 % 45.7 43.2 42.4  Platelets 150 - 400 K/uL  160 215 235     Most recent CMP CMP Latest Ref Rng & Units 12/04/2021 08/12/2020  Glucose 70 - 99 mg/dL 148(H) 134(H)  BUN 8 - 23 mg/dL 12 13  Creatinine 0.61 - 1.24 mg/dL 1.27(H) 1.19  Sodium 135 - 145 mmol/L 136 138  Potassium 3.5 - 5.1 mmol/L 3.2(L) 3.5  Chloride 98 - 111 mmol/L 99 101  CO2 22 - 32 mmol/L 28 26  Calcium 8.9 - 10.3 mg/dL 9.1 8.8(L)  Total Protein 6.5 - 8.1 g/dL - 7.4  Total Bilirubin 0.3 - 1.2 mg/dL - 0.9  Alkaline Phos 38 - 126 U/L - 54  AST 15 - 41 U/L - 19  ALT 0 - 44 U/L - 12    Renal function CrCl cannot be calculated (Patient's most recent lab result is older than the maximum 21 days allowed.).  No results found for: HGBA1C  No results found for: LDLCALC, LDLC, HIRISKLDL, POCLDL, LDLDIRECT, REALLDLC, TOTLDLC    Vascular Imaging: ***  Tejuan Gholson N. Stanford Breed, MD [facility] [place] Phone Number: [phone] 01/02/2022 4:27 PM  Total time spent on preparing this encounter including chart review, data review, collecting history, examining the patient, coordinating care for this {tnhtimebilling:26202}  Portions of this report may have been transcribed using voice recognition software.  Every effort has been made to ensure accuracy; however, inadvertent computerized transcription errors may still be present.

## 2022-01-03 ENCOUNTER — Ambulatory Visit (HOSPITAL_COMMUNITY)
Admission: RE | Admit: 2022-01-03 | Discharge: 2022-01-03 | Disposition: A | Discharge status: Home / Self Care | Payer: Medicare Other | Source: Ambulatory Visit | Attending: Surgery | Admitting: Surgery

## 2022-01-03 ENCOUNTER — Encounter: Payer: Medicare Other | Admitting: Vascular Surgery

## 2022-01-03 ENCOUNTER — Other Ambulatory Visit: Payer: Self-pay

## 2022-01-03 DIAGNOSIS — I6529 Occlusion and stenosis of unspecified carotid artery: Secondary | ICD-10-CM | POA: Insufficient documentation

## 2022-01-17 ENCOUNTER — Encounter: Payer: Self-pay | Admitting: Gastroenterology

## 2022-01-17 ENCOUNTER — Telehealth: Payer: Self-pay | Admitting: Gastroenterology

## 2022-01-17 NOTE — Telephone Encounter (Signed)
Pt has had diarrhea for three weeks needing advice on what he should do.

## 2022-01-19 ENCOUNTER — Encounter: Payer: Self-pay | Admitting: Gastroenterology

## 2022-01-23 ENCOUNTER — Encounter: Payer: Medicare Other | Admitting: Surgery

## 2022-01-25 ENCOUNTER — Telehealth: Payer: Self-pay

## 2022-01-25 DIAGNOSIS — R194 Change in bowel habit: Secondary | ICD-10-CM

## 2022-01-25 MED ORDER — NA SULFATE-K SULFATE-MG SULF 17.5-3.13-1.6 GM/177ML PO SOLN: 1.0000 | Freq: Once | ORAL | 0 refills | Status: AC

## 2022-01-25 NOTE — Telephone Encounter (Signed)
Procedure clearance form faxed to Dr Corky Sox- Cardiology  ?

## 2022-01-27 ENCOUNTER — Ambulatory Visit (INDEPENDENT_AMBULATORY_CARE_PROVIDER_SITE_OTHER): Payer: Medicare Other | Admitting: Surgery

## 2022-01-27 ENCOUNTER — Encounter: Payer: Self-pay | Admitting: Surgery

## 2022-01-27 ENCOUNTER — Other Ambulatory Visit: Payer: Self-pay

## 2022-01-27 VITALS — BP 159/76 | HR 66 | Temp 98.5°F | Resp 20 | Ht 70.0 in | Wt 167.0 lb

## 2022-01-27 DIAGNOSIS — I6521 Occlusion and stenosis of right carotid artery: Secondary | ICD-10-CM

## 2022-01-27 NOTE — Progress Notes (Signed)
? ?[facility] [place] ? ?Patient name: Brian Little MRN: [id number] DOB: 1934-06-25 Sex: male ? ? ?REQUESTING PROVIDER:  ? ? Dr. [name] ? ? ?REASON FOR CONSULT:  ?  ?Symptomatic right carotid stenosis ? ?HISTORY OF PRESENT ILLNESS:  ? ?Brian Little is a 88 y.o. male, who is referred for evaluation of carotid stenosis.  He had a syncopal event recently and this led to a battery of tests including a carotid Doppler.  He was found to have moderate right-sided stenosis.  He is asymptomatic.  Specifically, he denies numbness or weakness in either extremity.  He denies slurred speech.  He denies amaurosis fugax. ? ?Patient has been having difficulty controlling his blood pressure.  It was felt that his syncopal episode was secondary to dehydration and hypotension.  He does take a baby aspirin.  He is a former smoker. ? ?PAST MEDICAL HISTORY  ? ? ?Past Medical History:  ?Diagnosis Date  ? BPH (benign prostatic hyperplasia)   ? Hypertension   ? ? ? ?FAMILY HISTORY  ? ?Family History  ?Problem Relation Age of Onset  ? Heart disease Father   ? Congestive Heart Failure Father   ? COPD Mother   ? Hypertension Mother   ? ? ?SOCIAL HISTORY:  ? ?Social History  ? ?Socioeconomic History  ? Marital status: Married  ?  Spouse name: Not on file  ? Number of children: Not on file  ? Years of education: Not on file  ? Highest education level: Not on file  ?Occupational History  ? Not on file  ?Tobacco Use  ? Smoking status: Former  ?  Types: Cigarettes  ?  Quit date: 01/10/1980  ?  Years since quitting: 42.0  ? Smokeless tobacco: Never  ?Vaping Use  ? Vaping Use: Never used  ?Substance and Sexual Activity  ? Alcohol use: Never  ? Drug use: Never  ? Sexual activity: Yes  ?  Birth control/protection: None  ?Other Topics Concern  ? Not on file  ?Social History Narrative  ? Not on file  ? ?Social Determinants of Health  ? ?Financial Resource Strain: Not on file  ?Food Insecurity:  Not on file  ?Transportation Needs: Not on file  ?Physical Activity: Not on file  ?Stress: Not on file  ?Social Connections: Not on file  ?Intimate Partner Violence: Not on file  ? ? ?ALLERGIES:  ? ? ?Allergies  ?Allergen Reactions  ? Bee Venom Anaphylaxis  ? Caffeine Other (See Comments)  ?  Other reaction(s): Other ?Vertigo ?vertigo ?  ? Ibuprofen   ?  Other reaction(s): Other (See Comments), Other (See Comments) ?vertigo ?vertigo ?  ? Nsaids Other (See Comments)  ?  Other reaction(s): Other (See Comments) ?Vertigo and n/v ?Vertigo and n/v ?  ? ? ?CURRENT MEDICATIONS:  ? ? ?Current Outpatient Medications  ?Medication Sig Dispense Refill  ? amLODipine (NORVASC) 10 MG tablet   2  ? aspirin 325 MG tablet Take by mouth.    ? azelastine (ASTELIN) 0.1 % nasal spray Place 1-2 sprays into both nostrils in the morning and at bedtime.    ? cetirizine (ZYRTEC) 10 MG tablet Take by mouth.    ? fluticasone (FLONASE) 50 MCG/ACT nasal spray Place 1 spray into both nostrils 2 (two) times daily.    ? hydrALAZINE (APRESOLINE) 100 MG tablet Take 100 mg by mouth 2 (two) times daily.    ? hydrochlorothiazide (HYDRODIURIL) 12.5 MG tablet Take 12.5 mg by mouth daily.    ?  losartan (COZAAR) 100 MG tablet   2  ? meclizine (ANTIVERT) 25 MG tablet Take by mouth.    ? montelukast (SINGULAIR) 10 MG tablet Take 1 tablet by mouth daily.    ? silodosin (RAPAFLO) 4 MG CAPS capsule Take 1 capsule (4 mg total) by mouth daily with breakfast. 90 capsule 3  ? tadalafil (CIALIS) 5 MG tablet Take 1 tablet (5 mg total) by mouth daily. 90 tablet 3  ? traZODone (DESYREL) 50 MG tablet Take 50-100 mg by mouth at bedtime.    ? vitamin B-12 (CYANOCOBALAMIN) 500 MCG tablet Take 500 mcg by mouth daily.    ? ?No current facility-administered medications for this visit.  ? ? ?REVIEW OF SYSTEMS:  ? ?'[X]'$  denotes positive finding, '[ ]'$  denotes negative finding ?Cardiac  Comments:  ?Chest pain or chest pressure:    ?Shortness of breath upon exertion:    ?Short of  breath when lying flat:    ?Irregular heart rhythm:    ?    ?Vascular    ?Pain in calf, thigh, or hip brought on by ambulation:    ?Pain in feet at night that wakes you up from your sleep:     ?Blood clot in your veins:    ?Leg swelling:     ?    ?Pulmonary    ?Oxygen at home:    ?Productive cough:     ?Wheezing:     ?    ?Neurologic    ?Sudden weakness in arms or legs:     ?Sudden numbness in arms or legs:     ?Sudden onset of difficulty speaking or slurred speech:    ?Temporary loss of vision in one eye:     ?Problems with dizziness:     ?    ?Gastrointestinal    ?Blood in stool:     ? ?Vomited blood:     ?    ?Genitourinary    ?Burning when urinating:     ?Blood in urine:    ?    ?Psychiatric    ?Major depression:     ?    ?Hematologic    ?Bleeding problems:    ?Problems with blood clotting too easily:    ?    ?Skin    ?Rashes or ulcers:    ?    ?Constitutional    ?Fever or chills:    ? ?PHYSICAL EXAM:  ? ?Vitals:  ? 01/27/22 1005 01/27/22 1007  ?BP: (!) 156/68 (!) 159/76  ?Pulse: 66   ?Resp: 20   ?Temp: 98.5 ?F (36.9 ?C)   ?SpO2: 97%   ?Weight: 167 lb (75.8 kg)   ?Height: '5\' 10"'$  (1.778 m)   ? ? ?GENERAL: The patient is a well-nourished male, in no acute distress. The vital signs are documented above. ?CARDIAC: There is a regular rate and rhythm.  ?VASCULAR: Palpable bilateral radial and pedal pulses ?PULMONARY: Nonlabored respirations ?ABDOMEN: Soft and non-tender with normal pitched bowel sounds.  ?MUSCULOSKELETAL: There are no major deformities or cyanosis. ?NEUROLOGIC: No focal weakness or paresthesias are detected. ?SKIN: There are no ulcers or rashes noted. ?PSYCHIATRIC: The patient has a normal affect. ? ?STUDIES:  ? ?I have reviewed the following studies: ?Right Carotid: Velocities in the right ICA are consistent with a 60-79%  ?               stenosis. Non-hemodynamically significant plaque <50% noted  ?in  ?  the CCA. The ECA appears >50% stenosed.  ? ?Left Carotid: Velocities in the left  ICA are consistent with a 1-39%  ?stenosis.  ?              Non-hemodynamically significant plaque <50% noted in the  ?CCA.  ? ?Vertebrals:  Bilateral vertebral arteries demonstrate antegrade flow.  ?Subclavians: Normal flow hemodynamics were seen in bilateral subclavian  ?             arteries ? ? ? ?ASSESSMENT and PLAN  ? ?Carotid stenosis: I discussed with the patient and his wife, but I do not feel that his carotid artery stenosis contributed to his syncopal event.  From what I can tell he is asymptomatic regarding his carotid disease.  Therefore, I would not recommend surgical correction until the stenosis becomes greater than 80%.  He should have appropriate treatment of his blood pressure, he needs to be on aspirin which he is already taken.  The only other addition to his medical regimen would be a statin.  Target LDL cholesterol should be less than 70.  I told him that I would let him make this decision with his primary medical team but that I would be in favor of starting a statin.  I would like for him to follow-up with a repeat carotid duplex in 6 months. ? ? ?Annamarie Major, IV, MD, FACS ?[facility] [place] ?Tel [phone] ?Pager [phone]  ?

## 2022-02-01 ENCOUNTER — Other Ambulatory Visit: Payer: Self-pay | Admitting: *Deleted

## 2022-02-01 DIAGNOSIS — I6529 Occlusion and stenosis of unspecified carotid artery: Secondary | ICD-10-CM

## 2022-02-06 ENCOUNTER — Telehealth: Payer: Self-pay | Admitting: Gastroenterology

## 2022-02-06 NOTE — Telephone Encounter (Signed)
Patient states he has some other health issues he needs to take care of and would like to cancel his colonoscopy. ?

## 2022-02-06 NOTE — Telephone Encounter (Signed)
Procedure canceled

## 2022-02-22 ENCOUNTER — Other Ambulatory Visit: Payer: Self-pay | Admitting: Internal Medicine

## 2022-02-23 ENCOUNTER — Other Ambulatory Visit: Payer: Self-pay | Admitting: Internal Medicine

## 2022-02-23 DIAGNOSIS — R911 Solitary pulmonary nodule: Secondary | ICD-10-CM

## 2022-03-03 ENCOUNTER — Ambulatory Visit
Admission: RE | Admit: 2022-03-03 | Discharge: 2022-03-03 | Disposition: A | Discharge status: Home / Self Care | Payer: Medicare Other | Source: Ambulatory Visit | Attending: Internal Medicine | Admitting: Internal Medicine

## 2022-03-03 DIAGNOSIS — R911 Solitary pulmonary nodule: Secondary | ICD-10-CM

## 2022-03-03 MED ORDER — IOPAMIDOL (ISOVUE-300) INJECTION 61%
75.0000 mL | Freq: Once | INTRAVENOUS | Status: AC | PRN
  Administered 2022-03-03: 75 mL via INTRAVENOUS

## 2022-03-07 ENCOUNTER — Ambulatory Visit: Admit: 2022-03-07 | Payer: Medicare Other | Admitting: Gastroenterology

## 2022-03-07 SURGERY — COLONOSCOPY WITH PROPOFOL
Anesthesia: General

## 2022-06-13 IMAGING — CT CT HEAD W/O CM
4 series · 16 of 47 positions shown, 18 images · non-contrast
Comparison: Head CT 08/04/2015.

CLINICAL DATA: 87-year-old male with history of memory loss.



[Series 2: head wo · axial · 0.44mm/px · z∈[-99,+21]mm · 7 of 32 slices shown, 9 images]
[im 4/32  brain]
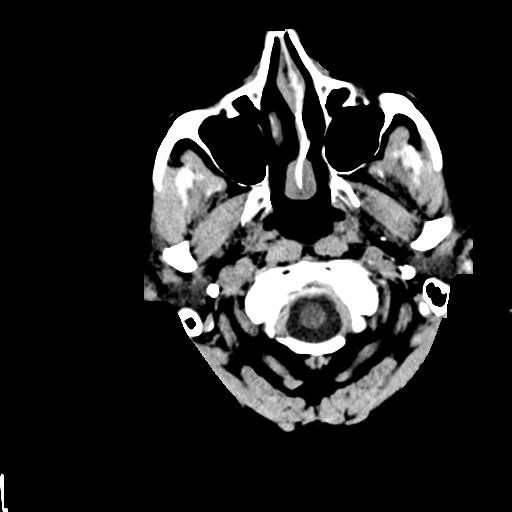
[im 4/32  bone]
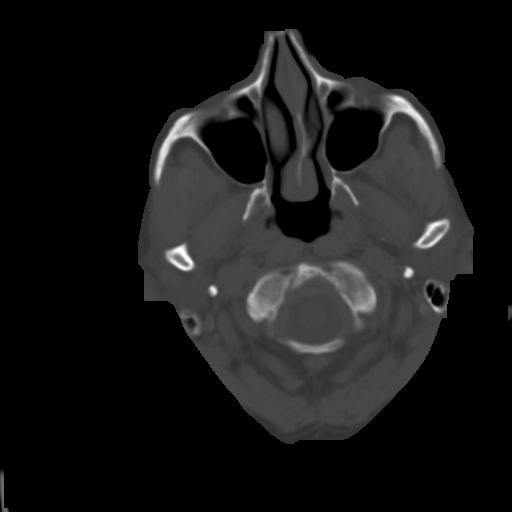
[im 8/32  brain]
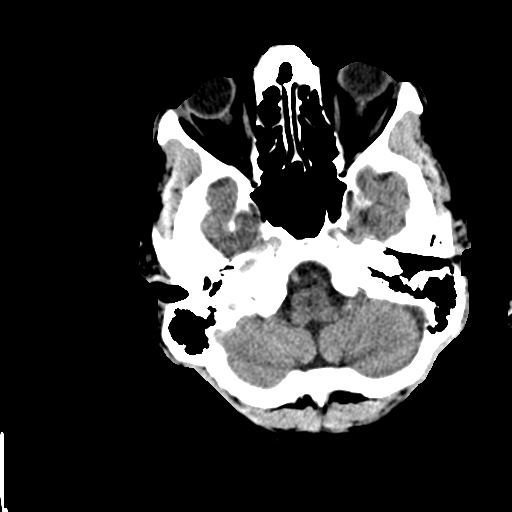
[im 12/32  brain]
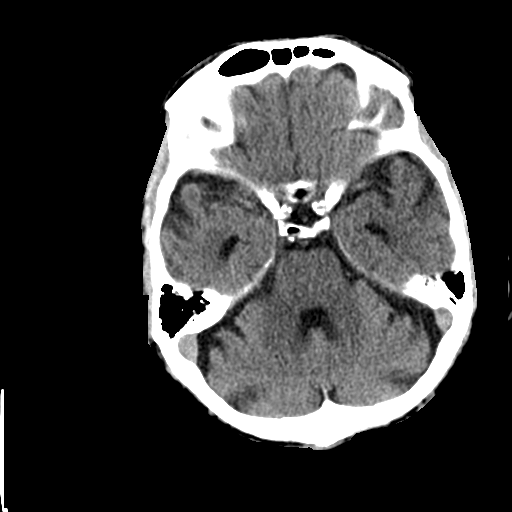
[im 16/32  brain]
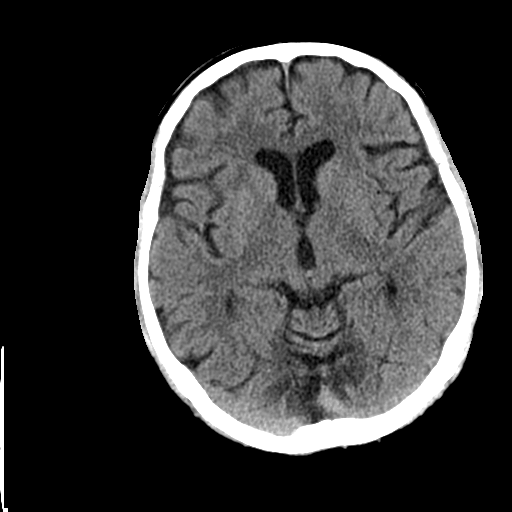
[im 20/32  brain]
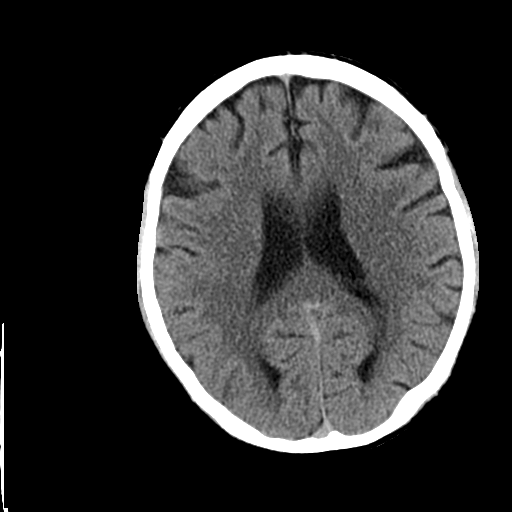
[im 20/32  bone]
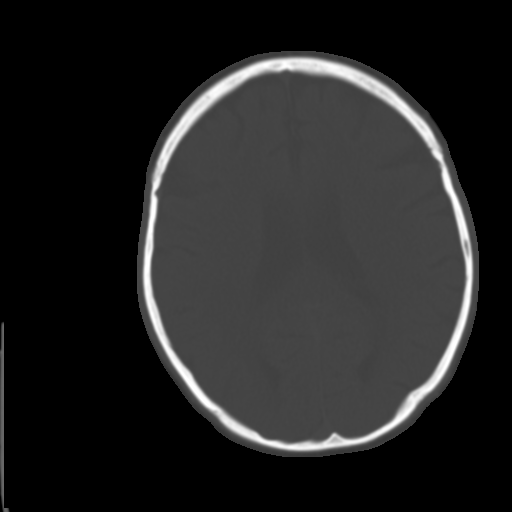
[im 24/32  brain]
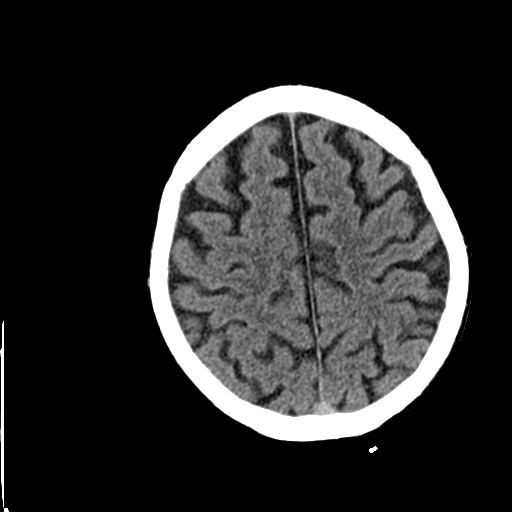
[im 28/32  brain]
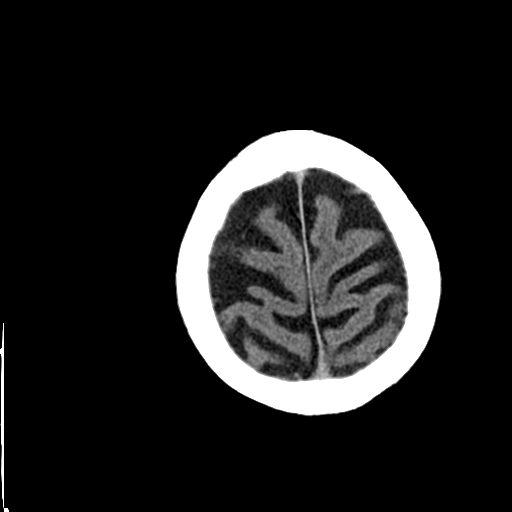

[Series 3: head bone · axial · 0.44mm/px · z∈[-100,-68]mm · 3 of 80 slices shown]
[im 8/80  bone]
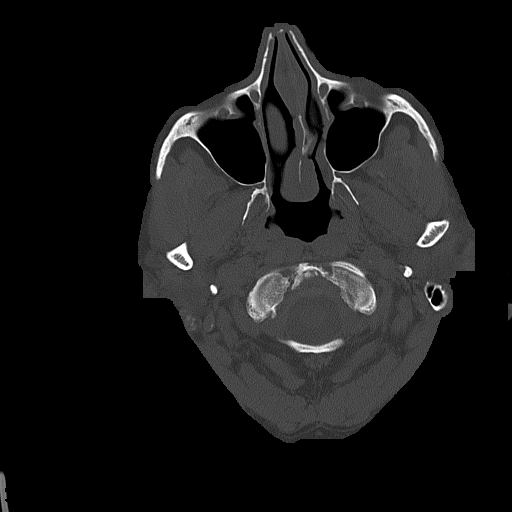
[im 16/80  bone]
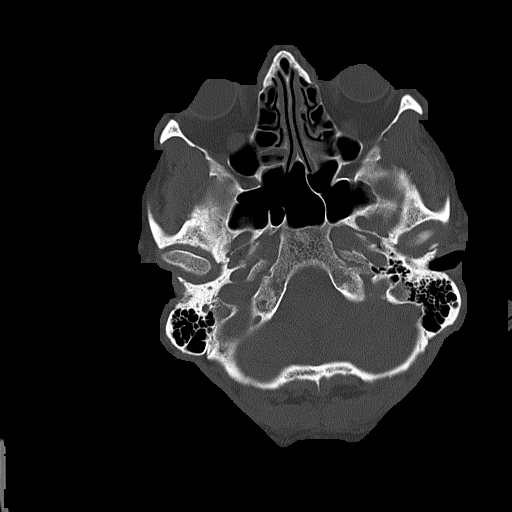
[im 24/80  bone]
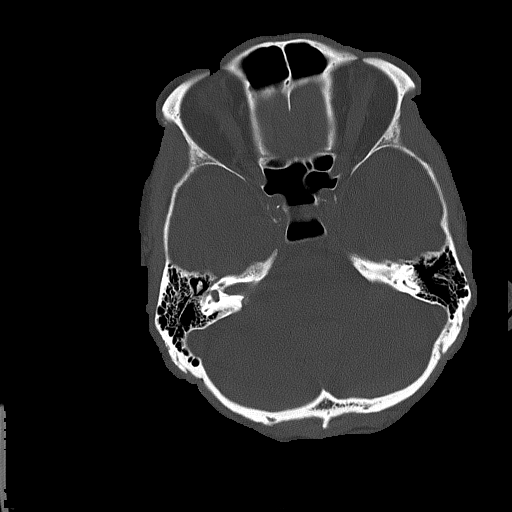

[Series 4: coronal soft tissue · coronal · 0.32mm/px · 3 of 66 slices shown]
[im 22/66  brain]
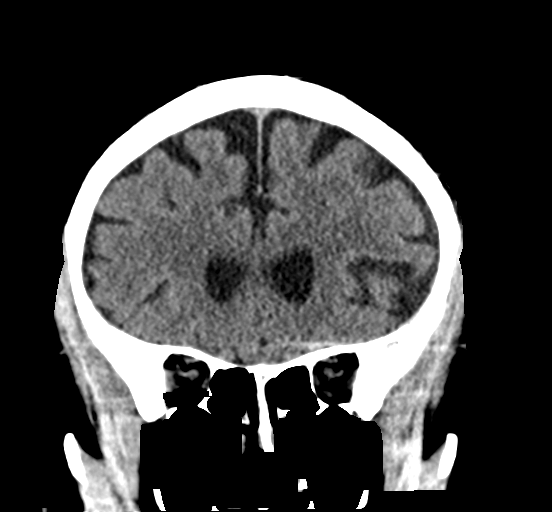
[im 29/66  brain]
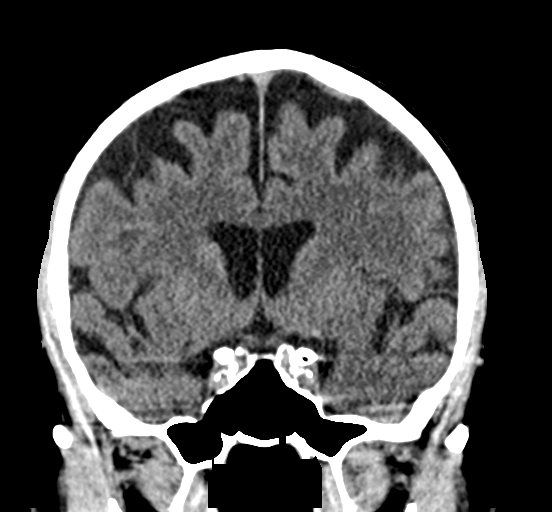
[im 37/66  brain]
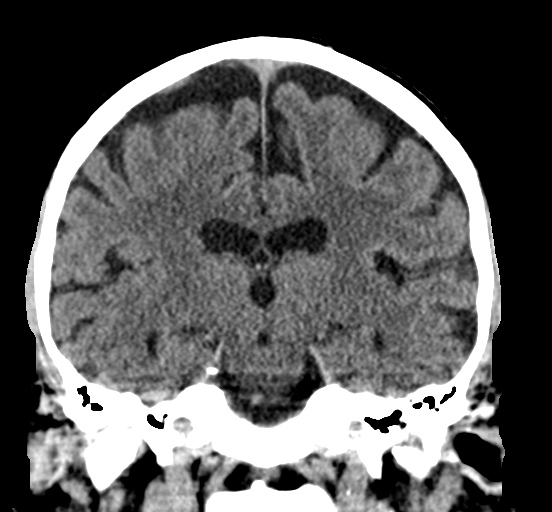

[Series 5: sagittal soft tissue · sagittal · 0.33mm/px · 3 of 60 slices shown]
[im 20/60  brain]
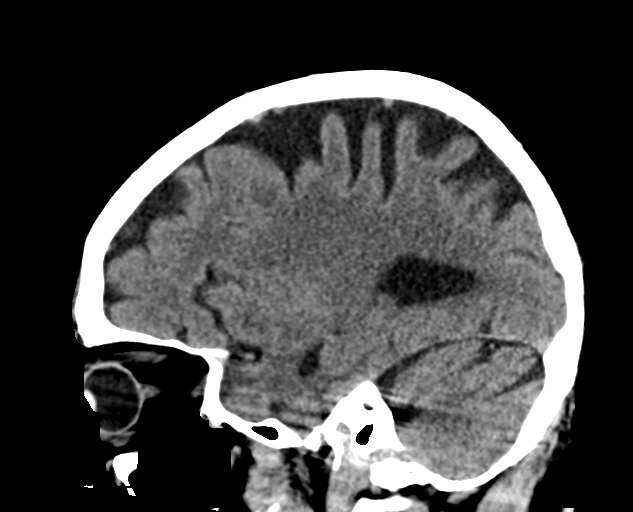
[im 30/60  brain]
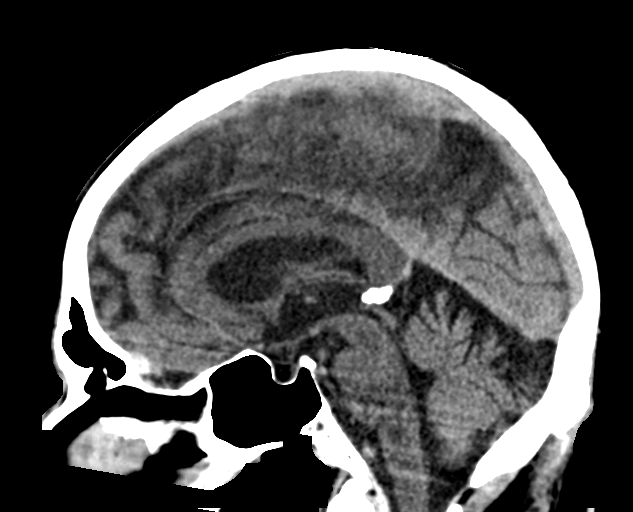
[im 40/60  brain]
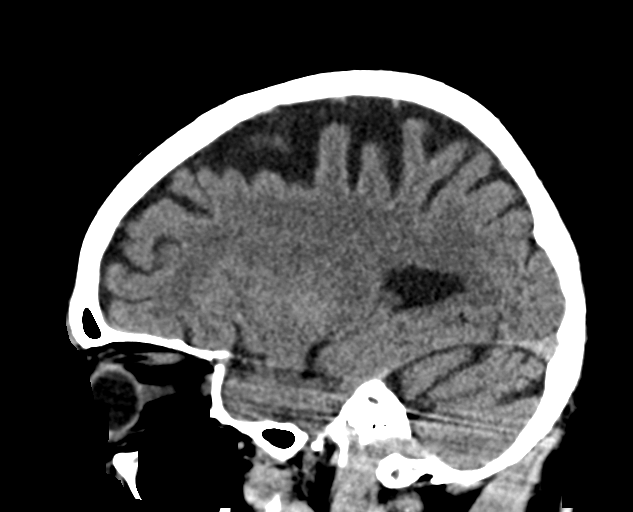

[16 of 47 positions shown; findings below may reference images not displayed]

FINDINGS: Brain: Moderate cerebral and cerebellar atrophy. Patchy areas of
decreased attenuation are noted throughout the deep and
periventricular white matter of the cerebral hemispheres
bilaterally, compatible with chronic microvascular ischemic disease.
No evidence of acute infarction, hemorrhage, hydrocephalus,
extra-axial collection or mass lesion/mass effect.

Vascular: No hyperdense vessel or unexpected calcification.

Skull: Normal. Negative for fracture or focal lesion.

Sinuses/Orbits: No acute finding.

Other: None.
IMPRESSION: 1. No acute intracranial abnormalities.
2. Moderate cerebral and cerebellar atrophy with chronic
microvascular ischemic changes in the cerebral white matter, as
above.

## 2022-08-18 ENCOUNTER — Encounter: Payer: Self-pay | Admitting: Urology

## 2022-08-21 ENCOUNTER — Other Ambulatory Visit: Payer: Self-pay | Admitting: Urology

## 2022-08-21 DIAGNOSIS — N401 Enlarged prostate with lower urinary tract symptoms: Secondary | ICD-10-CM

## 2022-09-06 ENCOUNTER — Other Ambulatory Visit: Payer: Self-pay | Admitting: Internal Medicine

## 2022-09-06 DIAGNOSIS — I739 Peripheral vascular disease, unspecified: Secondary | ICD-10-CM

## 2022-09-14 ENCOUNTER — Ambulatory Visit
Admission: RE | Admit: 2022-09-14 | Discharge: 2022-09-14 | Disposition: A | Discharge status: Home / Self Care | Payer: Medicare Other | Source: Ambulatory Visit | Attending: Internal Medicine | Admitting: Internal Medicine

## 2022-09-14 DIAGNOSIS — I739 Peripheral vascular disease, unspecified: Secondary | ICD-10-CM | POA: Insufficient documentation

## 2022-09-14 HISTORY — DX: Malignant (primary) neoplasm, unspecified: C80.1

## 2022-09-14 MED ORDER — IOHEXOL 350 MG/ML SOLN
125.0000 mL | Freq: Once | INTRAVENOUS | Status: AC | PRN
  Administered 2022-09-14: 125 mL via INTRAVENOUS

## 2022-09-15 ENCOUNTER — Ambulatory Visit (INDEPENDENT_AMBULATORY_CARE_PROVIDER_SITE_OTHER): Payer: Medicare Other | Admitting: Urology

## 2022-09-15 ENCOUNTER — Encounter: Payer: Self-pay | Admitting: Urology

## 2022-09-15 VITALS — BP 142/79 | HR 82 | Ht 69.0 in | Wt 158.0 lb

## 2022-09-15 DIAGNOSIS — N401 Enlarged prostate with lower urinary tract symptoms: Secondary | ICD-10-CM | POA: Diagnosis not present

## 2022-09-15 DIAGNOSIS — R351 Nocturia: Secondary | ICD-10-CM

## 2022-09-15 DIAGNOSIS — N138 Other obstructive and reflux uropathy: Secondary | ICD-10-CM

## 2022-09-15 MED ORDER — GEMTESA 75 MG PO TABS: 75.0000 mg | ORAL_TABLET | Freq: Every day | ORAL | 0 refills | Status: DC

## 2022-09-15 NOTE — Progress Notes (Signed)
09/15/2022 8:37 AM   Brian Little 1934-03-19 [id number]  Referring provider: Adin Hector, MD [facility] [place],  [place] [address]  Chief Complaint  Patient presents with   Benign Prostatic Hypertrophy    Urologic history: 1.  BPH with lower urinary tract symptoms with nocturia -Bothersome LUTS; unable to tolerate tamsulosin and silodosin secondary to orthostatic changes/headache -No significant improvement on anticholinergic medication, DDAVP and Myrbetriq -Presently on silodosin since 4 mg/tadalafil 5 mg daily   2.  History elevated PSA -Biopsy 1998 PSA 8.6 with benign pathology -Most recent PSA levels over 3/low 4 range  HPI: 88 y.o. male presents for follow-up.  Last seen December 2022 and nocturia was stable at 3-4 He has noted increased nocturia over the last month.  He kept a log from 10/15-10/24 and his lowest was 4 times per night though there were occasions when he was up 8-10 times per night Has failed multiple medications.  Currently on silodosin and tadalafil Prior sleep study negative for OSA Inquiring if he would see any improvement with UroLift He did have a CT angio yesterday and prostate volume calculated at 90 cc   PMH: Past Medical History:  Diagnosis Date   BPH (benign prostatic hyperplasia)    Cancer ([place])    Hypertension     Surgical History: Past Surgical History:  Procedure Laterality Date   ADENOIDECTOMY     ROTATOR CUFF REPAIR     x 2, 2008, 2011   ROTATOR CUFF REPAIR     left and right   TONSILLECTOMY     88 years old    Home Medications:  Allergies as of 09/15/2022       Reactions   Bee Venom Anaphylaxis   Caffeine Other (See Comments)   Other reaction(s): Other Vertigo vertigo   Ibuprofen    Other reaction(s): Other (See Comments), Other (See Comments) vertigo vertigo   Nsaids Other (See Comments)   Other reaction(s): Other (See Comments) Vertigo and n/v Vertigo and n/v         Medication List        Accurate as of September 15, 2022  8:37 AM. If you have any questions, ask your nurse or doctor.          STOP taking these medications    hydrochlorothiazide 12.5 MG tablet Commonly known as: HYDRODIURIL Stopped by: Abbie Sons, MD   meclizine 25 MG tablet Commonly known as: ANTIVERT Stopped by: Abbie Sons, MD   montelukast 10 MG tablet Commonly known as: SINGULAIR Stopped by: Abbie Sons, MD       TAKE these medications    amLODipine 10 MG tablet Commonly known as: NORVASC   aspirin 325 MG tablet Take by mouth.   azelastine 0.1 % nasal spray Commonly known as: ASTELIN Place 1-2 sprays into both nostrils in the morning and at bedtime.   cetirizine 10 MG tablet Commonly known as: ZYRTEC Take by mouth.   fluticasone 50 MCG/ACT nasal spray Commonly known as: FLONASE Place 1 spray into both nostrils 2 (two) times daily.   Gemtesa 75 MG Tabs Generic drug: Vibegron Take 75 mg by mouth daily. Started by: Abbie Sons, MD   hydrALAZINE 100 MG tablet Commonly known as: APRESOLINE Take 100 mg by mouth 2 (two) times daily.   losartan 100 MG tablet Commonly known as: COZAAR   silodosin 4 MG Caps capsule Commonly known as: RAPAFLO Take 1 capsule (4 mg  total) by mouth daily with breakfast.   tadalafil 5 MG tablet Commonly known as: CIALIS TAKE 1 TABLET BY MOUTH DAILY   traZODone 50 MG tablet Commonly known as: DESYREL Take 50-100 mg by mouth at bedtime.   vitamin B-12 500 MCG tablet Commonly known as: CYANOCOBALAMIN Take 500 mcg by mouth daily.        Allergies:  Allergies  Allergen Reactions   Bee Venom Anaphylaxis   Caffeine Other (See Comments)    Other reaction(s): Other Vertigo vertigo    Ibuprofen     Other reaction(s): Other (See Comments), Other (See Comments) vertigo vertigo    Nsaids Other (See Comments)    Other reaction(s): Other (See Comments) Vertigo and n/v Vertigo and n/v      Family History: Family History  Problem Relation Age of Onset   Heart disease Father    Congestive Heart Failure Father    COPD Mother    Hypertension Mother     Social History:  reports that he quit smoking about 42 years ago. His smoking use included cigarettes. He has never used smokeless tobacco. He reports that he does not drink alcohol and does not use drugs.   Physical Exam: BP (!) 142/79   Pulse 82   Ht '5\' 9"'$  (1.753 m)   Wt 158 lb (71.7 kg)   BMI 23.33 kg/m   Constitutional:  Alert and oriented, No acute distress. HEENT: [place] AT, moist mucus membranes.  Trachea midline, no masses. Cardiovascular: No clubbing, cyanosis, or edema. Respiratory: Normal respiratory effort, no increased work of breathing. Psychiatric: Normal mood and affect.   Assessment & Plan:    1.  BPH with LUTS Stable Silodosin/tadalafil refilled Continue annual follow-up  2.  Nocturia Worsening Discussed that nocturia is 1 symptom that is resistant to medical and surgical treatments.  Although his nocturia could improve after surgery it may also continue Based on prostate size would not recommend UroLift He was interested in a trial of Gemtesa and given samples. Follow-up 1 month for symptom check and PVR   Abbie Sons, MD  [place] [address], [place],  [address] [phone]

## 2022-09-22 ENCOUNTER — Encounter: Payer: Self-pay | Admitting: Urology

## 2022-09-22 ENCOUNTER — Other Ambulatory Visit: Payer: Self-pay | Admitting: Family Medicine

## 2022-09-22 DIAGNOSIS — N401 Enlarged prostate with lower urinary tract symptoms: Secondary | ICD-10-CM

## 2022-09-22 MED ORDER — TADALAFIL 5 MG PO TABS: 5.0000 mg | ORAL_TABLET | Freq: Every day | ORAL | 3 refills | Status: DC

## 2022-09-22 MED ORDER — SILODOSIN 4 MG PO CAPS: 4.0000 mg | ORAL_CAPSULE | Freq: Every day | ORAL | 3 refills | Status: DC

## 2022-09-25 ENCOUNTER — Other Ambulatory Visit: Payer: Self-pay | Admitting: *Deleted

## 2022-09-25 DIAGNOSIS — I6529 Occlusion and stenosis of unspecified carotid artery: Secondary | ICD-10-CM

## 2022-09-27 NOTE — Progress Notes (Signed)
Office Note     CC:  follow up Requesting Provider:  Adin Hector, MD  HPI: Brian Little is a 88 y.o. (12-19-1933) male who presents for evaluation of PAD. He has been previously evaluated for Carotid Artery stenosis earlier this year by Dr. Trula Slade. He was having syncopal episodes but felt that his carotid stenosis was not contributing to these episodes. On duplex he was found to have 60-79% right ICA stenosis and 1-39% left ICA stenosis.   He explains that he recently told his PCP that when he walks he gets a tightness around his ankles. This is equal in both legs. This occurs at about 1/4 mile. He rests for several minutes and it goes away. It will reoccur to lesser degree on continued ambulation. This does not stop him from doing his ADL's. He denies any pain in his calves or thighs on ambulation. No pain at rest. No tissue loss. He does describe what he calls" sock feet". Has sensation of wearing socks even when he does not have any on.  The pt is not on a statin for cholesterol management.  The pt is on a daily aspirin.   Other AC:  none The pt is on CCB, ARB, Hydralazine for hypertension.   The pt is not diabetic.   Tobacco hx:  Former  Past Medical History:  Diagnosis Date   BPH (benign prostatic hyperplasia)    Cancer ([facility])    Hypertension     Past Surgical History:  Procedure Laterality Date   ADENOIDECTOMY     ROTATOR CUFF REPAIR     x 2, 2008, 2011   ROTATOR CUFF REPAIR     left and right   TONSILLECTOMY     88 years old    Social History   Socioeconomic History   Marital status: Married    Spouse name: Not on file   Number of children: Not on file   Years of education: Not on file   Highest education level: Not on file  Occupational History   Not on file  Tobacco Use   Smoking status: Former    Types: Cigarettes    Quit date: 01/10/1980    Years since quitting: 42.7   Smokeless tobacco: Never  Vaping Use   Vaping Use: Never used  Substance and  Sexual Activity   Alcohol use: Never   Drug use: Never   Sexual activity: Yes    Birth control/protection: None  Other Topics Concern   Not on file  Social History Narrative   Not on file   Social Determinants of Health   Financial Resource Strain: Not on file  Food Insecurity: Not on file  Transportation Needs: Not on file  Physical Activity: Not on file  Stress: Not on file  Social Connections: Not on file  Intimate Partner Violence: Not on file    Family History  Problem Relation Age of Onset   Heart disease Father    Congestive Heart Failure Father    COPD Mother    Hypertension Mother     Current Outpatient Medications  Medication Sig Dispense Refill   amLODipine (NORVASC) 10 MG tablet   2   aspirin 325 MG tablet Take by mouth.     azelastine (ASTELIN) 0.1 % nasal spray Place 1-2 sprays into both nostrils in the morning and at bedtime.     cetirizine (ZYRTEC) 10 MG tablet Take by mouth.     fluticasone (FLONASE) 50 MCG/ACT nasal spray Place  1 spray into both nostrils 2 (two) times daily.     hydrALAZINE (APRESOLINE) 100 MG tablet Take 100 mg by mouth 2 (two) times daily.     losartan (COZAAR) 100 MG tablet   2   silodosin (RAPAFLO) 4 MG CAPS capsule Take 1 capsule (4 mg total) by mouth daily with breakfast. 90 capsule 3   tadalafil (CIALIS) 5 MG tablet Take 1 tablet (5 mg total) by mouth daily. 90 tablet 3   traZODone (DESYREL) 50 MG tablet Take 50-100 mg by mouth at bedtime.     Vibegron (GEMTESA) 75 MG TABS Take 75 mg by mouth daily. 30 tablet 0   vitamin B-12 (CYANOCOBALAMIN) 500 MCG tablet Take 500 mcg by mouth daily.     No current facility-administered medications for this visit.    Allergies  Allergen Reactions   Bee Venom Anaphylaxis   Caffeine Other (See Comments)    Other reaction(s): Other Vertigo vertigo    Ibuprofen     Other reaction(s): Other (See Comments), Other (See Comments) vertigo vertigo    Nsaids Other (See Comments)    Other  reaction(s): Other (See Comments) Vertigo and n/v Vertigo and n/v      REVIEW OF SYSTEMS:  '[X]'$  denotes positive finding, '[ ]'$  denotes negative finding Cardiac  Comments:  Chest pain or chest pressure:    Shortness of breath upon exertion:    Short of breath when lying flat:    Irregular heart rhythm:        Vascular    Pain in calf, thigh, or hip brought on by ambulation:    Pain in feet at night that wakes you up from your sleep:     Blood clot in your veins:    Leg swelling:         Pulmonary    Oxygen at home:    Productive cough:     Wheezing:         Neurologic    Sudden weakness in arms or legs:     Sudden numbness in arms or legs:     Sudden onset of difficulty speaking or slurred speech:    Temporary loss of vision in one eye:     Problems with dizziness:         Gastrointestinal    Blood in stool:     Vomited blood:         Genitourinary    Burning when urinating:     Blood in urine:        Psychiatric    Major depression:         Hematologic    Bleeding problems:    Problems with blood clotting too easily:        Skin    Rashes or ulcers:        Constitutional    Fever or chills:      PHYSICAL EXAMINATION:  Vitals:   10/02/22 1548  BP: (!) 170/81  Pulse: 78  Resp: 18  Temp: 98 F (36.7 C)  TempSrc: Temporal  SpO2: 98%  Weight: 162 lb (73.5 kg)  Height: '5\' 10"'$  (1.778 m)    General:  WDWN in NAD; vital signs documented above Gait: Normal HENT: WNL, normocephalic Pulmonary: normal non-labored breathing  Cardiac: regular HR, without  Murmurs with carotid bruit Vascular Exam/Pulses:  Right Left  Radial 2+ (normal) 2+ (normal)  Femoral 2+ (normal) 2+ (normal)  Popliteal Not palpable Not palpable  DP Not palpable Not palpable  PT 2+ (normal) 2+ (normal)   Extremities: without ischemic changes, without Gangrene , without cellulitis; without open wounds;  Musculoskeletal: no muscle wasting or atrophy  Neurologic: A&O X 3;  No focal  weakness or paresthesias are detected Psychiatric:  The pt has Normal affect.   Non-Invasive Vascular Imaging:   +-------+-----------+-----------+------------+------------+ ABI/TBIToday's ABIToday's TBIPrevious ABIPrevious TBI +-------+-----------+-----------+------------+------------+ Right  0.95       0.63                                +-------+-----------+-----------+------------+------------+ Left   0.81       0.47                                +-------+-----------+-----------+------------+------------+  CTA Angio Ao+ Bifem w & Or Wo Contrast: IMPRESSION: VASCULAR   1. Focal high-grade stenosis of the distal left superficial femoral artery. 2. Multiple mild stenoses throughout the right superficial femoral artery, unlikely to be hemodynamically significant. 3. 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 4. Scattered aortic atherosclerotic plaque including a penetrating atherosclerotic ulcer. No aneurysm or dissection.   NON-VASCULAR   1. Cardiomegaly. 2. Marked prostatomegaly. 3. Additional ancillary findings as above.   ASSESSMENT/PLAN:: 88 y.o. male here for follow up for evaluation of PAD. He has mild claudication symptoms. This is not disabling. His CTA shows tibial disease bilaterally with chronically occluded Anterior tibial arteries. He also has an SFA lesion on the left. His ABIs shows normal flow on RLE, mild arterial disease in LLE. No intervention is indicated at this time. I have encouraged walking/ exercise regimen. Discussed that we would not recommend any intervention unless he developed disabling claudication, rest pain or tissue loss. - Continue Aspirin - he will follow up in 3 months for Carotid Duplex, and will have repeat ABI in 6 months   Karoline Caldwell, PA-C [facility] MD:   Trula Slade

## 2022-10-02 ENCOUNTER — Ambulatory Visit (INDEPENDENT_AMBULATORY_CARE_PROVIDER_SITE_OTHER): Payer: Medicare Other | Admitting: Physician Assistant

## 2022-10-02 ENCOUNTER — Ambulatory Visit (HOSPITAL_COMMUNITY)
Admission: RE | Admit: 2022-10-02 | Discharge: 2022-10-02 | Disposition: A | Discharge status: Home / Self Care | Payer: Medicare Other | Source: Ambulatory Visit | Attending: Surgery | Admitting: Surgery

## 2022-10-02 VITALS — BP 170/81 | HR 78 | Temp 98.0°F | Resp 18 | Ht 70.0 in | Wt 162.0 lb

## 2022-10-02 DIAGNOSIS — I6529 Occlusion and stenosis of unspecified carotid artery: Secondary | ICD-10-CM

## 2022-10-02 DIAGNOSIS — I6521 Occlusion and stenosis of right carotid artery: Secondary | ICD-10-CM

## 2022-10-02 DIAGNOSIS — I739 Peripheral vascular disease, unspecified: Secondary | ICD-10-CM

## 2022-10-09 ENCOUNTER — Other Ambulatory Visit: Payer: Self-pay

## 2022-10-09 DIAGNOSIS — I739 Peripheral vascular disease, unspecified: Secondary | ICD-10-CM

## 2022-10-16 ENCOUNTER — Ambulatory Visit: Payer: Self-pay

## 2022-10-16 ENCOUNTER — Encounter: Payer: Self-pay | Admitting: Urology

## 2022-10-16 ENCOUNTER — Ambulatory Visit (INDEPENDENT_AMBULATORY_CARE_PROVIDER_SITE_OTHER): Payer: Medicare Other | Admitting: Urology

## 2022-10-16 VITALS — BP 169/69 | HR 80 | Ht 70.0 in | Wt 167.0 lb

## 2022-10-16 DIAGNOSIS — N401 Enlarged prostate with lower urinary tract symptoms: Secondary | ICD-10-CM

## 2022-10-16 DIAGNOSIS — R339 Retention of urine, unspecified: Secondary | ICD-10-CM | POA: Diagnosis not present

## 2022-10-16 DIAGNOSIS — N138 Other obstructive and reflux uropathy: Secondary | ICD-10-CM

## 2022-10-16 DIAGNOSIS — R351 Nocturia: Secondary | ICD-10-CM | POA: Diagnosis not present

## 2022-10-16 LAB — BLADDER SCAN AMB NON-IMAGING: Scan Result: 230

## 2022-10-16 MED ORDER — GEMTESA 75 MG PO TABS: 75.0000 mg | ORAL_TABLET | Freq: Every day | ORAL | 3 refills | Status: DC

## 2022-10-16 NOTE — Progress Notes (Signed)
10/16/2022 8:16 AM   Brian Little [birth year] [id number]  Referring provider: Adin Hector, MD [place] [facility] [place],  [place] [address]  Chief Complaint  Patient presents with   Benign Prostatic Hypertrophy    Urologic history: 1.  BPH with lower urinary tract symptoms with nocturia -Bothersome LUTS; unable to tolerate tamsulosin and silodosin secondary to orthostatic changes/headache -No significant improvement on anticholinergic medication, DDAVP and Myrbetriq -Presently on silodosin since 4 mg/tadalafil 5 mg daily   2.  History elevated PSA -Biopsy 1998 PSA 8.6 with benign pathology -Most recent PSA levels over 3/low 4 range  HPI: 88 y.o. male presents for 1 month follow-up.  Refer to prior note 09/15/2022 Does feel his [name] has decreased his nocturia.  Over the past month he has kept a log and on average gets up 4-5 times per night which he states he can live with. Feels his stream may be slightly better   PMH: Past Medical History:  Diagnosis Date   BPH (benign prostatic hyperplasia)    Cancer ([place])    Hypertension     Surgical History: Past Surgical History:  Procedure Laterality Date   ADENOIDECTOMY     ROTATOR CUFF REPAIR     x 2, 2008, 2011   ROTATOR CUFF REPAIR     left and right   TONSILLECTOMY     88 years old    Home Medications:  Allergies as of 10/16/2022       Reactions   Bee Venom Anaphylaxis   Caffeine Other (See Comments)   Other reaction(s): Other Vertigo vertigo   Ibuprofen    Other reaction(s): Other (See Comments), Other (See Comments) vertigo vertigo   Nsaids Other (See Comments)   Other reaction(s): Other (See Comments) Vertigo and n/v Vertigo and n/v        Medication List        Accurate as of October 16, 2022  8:16 AM. If you have any questions, ask your nurse or doctor.          amLODipine 10 MG tablet Commonly known as: NORVASC   aspirin 325 MG tablet Take by  mouth.   azelastine 0.1 % nasal spray Commonly known as: ASTELIN Place 1-2 sprays into both nostrils in the morning and at bedtime.   cetirizine 10 MG tablet Commonly known as: ZYRTEC Take by mouth.   fluticasone 50 MCG/ACT nasal spray Commonly known as: FLONASE Place 1 spray into both nostrils 2 (two) times daily.   Gemtesa 75 MG Tabs Generic drug: Vibegron Take 75 mg by mouth daily.   hydrALAZINE 100 MG tablet Commonly known as: APRESOLINE Take 100 mg by mouth 2 (two) times daily.   losartan 100 MG tablet Commonly known as: COZAAR   silodosin 4 MG Caps capsule Commonly known as: RAPAFLO Take 1 capsule (4 mg total) by mouth daily with breakfast.   tadalafil 5 MG tablet Commonly known as: CIALIS Take 1 tablet (5 mg total) by mouth daily.   traZODone 50 MG tablet Commonly known as: DESYREL Take 50-100 mg by mouth at bedtime.   vitamin B-12 500 MCG tablet Commonly known as: CYANOCOBALAMIN Take 500 mcg by mouth daily.        Allergies:  Allergies  Allergen Reactions   Bee Venom Anaphylaxis   Caffeine Other (See Comments)    Other reaction(s): Other Vertigo vertigo    Ibuprofen     Other reaction(s): Other (See Comments), Other (See Comments) vertigo  vertigo    Nsaids Other (See Comments)    Other reaction(s): Other (See Comments) Vertigo and n/v Vertigo and n/v     Family History: Family History  Problem Relation Age of Onset   Heart disease Father    Congestive Heart Failure Father    COPD Mother    Hypertension Mother     Social History:  reports that he quit smoking about 42 years ago. His smoking use included cigarettes. He has never used smokeless tobacco. He reports that he does not drink alcohol and does not use drugs.   Physical Exam: BP (!) 169/69   Pulse 80   Ht '5\' 10"'$  (1.778 m)   Wt 167 lb (75.8 kg)   BMI 23.96 kg/m   Constitutional:  Alert and oriented, No acute distress. HEENT: [place] AT, moist mucus membranes.  Trachea  midline, no masses. Cardiovascular: No clubbing, cyanosis, or edema. Respiratory: Normal respiratory effort, no increased work of breathing. Psychiatric: Normal mood and affect.   Assessment & Plan:    1.  BPH with LUTS PVR today was 230 mL.  Previous PVRs were in the 30-50 range Prostate volume is 90 cc and if he was interested in pursuing outlet procedure we discussed HoLEP.  He was offered an appointment with one of my partners though wanted to hold off. Follow-up 3 months with a repeat PVR; continue silodosin and tadalafil  2.  Nocturia Feels nocturia has improved on Gemtesa We again discussed if he elected to procedure his nocturia may not improve Rx Gemtesa sent to [place], [place] [address] [place], [place] [address] [phone]

## 2022-10-18 ENCOUNTER — Telehealth: Payer: Self-pay | Admitting: Family Medicine

## 2022-10-18 NOTE — Telephone Encounter (Signed)
-----   Message from [place], [place] sent at 10/17/2022  3:49 PM EST ----- Brian Little

## 2022-10-18 NOTE — Telephone Encounter (Signed)
Patient notified PA was denied for [facility]. He will wait to see how he does before deciding if he wants to try something different.

## 2022-10-25 ENCOUNTER — Other Ambulatory Visit: Payer: Self-pay | Admitting: Orthopedic Surgery

## 2022-10-26 ENCOUNTER — Encounter
Admission: RE | Admit: 2022-10-26 | Discharge: 2022-10-26 | Disposition: A | Discharge status: Home / Self Care | Payer: Medicare Other | Source: Ambulatory Visit | Attending: Orthopedic Surgery | Admitting: Orthopedic Surgery

## 2022-10-26 ENCOUNTER — Other Ambulatory Visit: Payer: Self-pay

## 2022-10-26 VITALS — Ht 70.0 in | Wt 160.0 lb

## 2022-10-26 DIAGNOSIS — I10 Essential (primary) hypertension: Secondary | ICD-10-CM

## 2022-10-26 DIAGNOSIS — E785 Hyperlipidemia, unspecified: Secondary | ICD-10-CM

## 2022-10-26 DIAGNOSIS — I700 Atherosclerosis of aorta: Secondary | ICD-10-CM

## 2022-10-26 NOTE — Patient Instructions (Signed)
Your procedure is scheduled on: 10/31/22 Report to [facility]. To find out your arrival time please call [phone] between 1PM - 3PM on 10/30/22.  Remember: Instructions that are not followed completely may result in serious medical risk, up to and including death, or upon the discretion of your surgeon and anesthesiologist your surgery may need to be rescheduled.     _X__ 1. Do not eat food or drink any liquids after midnight the night before your procedure.                 No gum chewing or hard candies.   __X__2.  On the morning of surgery brush your teeth with toothpaste and water, you                 may rinse your mouth with mouthwash if you wish.  Do not swallow any              toothpaste of mouthwash.     _X__ 3.  No Alcohol for 24 hours before or after surgery.   _X__ 4.  Do Not Smoke or use e-cigarettes For 24 Hours Prior to Your Surgery.                 Do not use any chewable tobacco products for at least 6 hours prior to                 surgery.  ____  5.  Bring all medications with you on the day of surgery if instructed.   __X__  6.  Notify your doctor if there is any change in your medical condition      (cold, fever, infections).     Do not wear jewelry, make-up, hairpins, clips or nail polish. Do not wear lotions, powders, or perfumes. No deodorant. Do not shave body hair 48 hours prior to surgery. Men may shave face and neck. Do not bring valuables to the hospital.    [facility] is not responsible for any belongings or valuables.  Contacts, dentures/partials or body piercings may not be worn into surgery. Bring a case for your contacts, glasses or hearing aids, a denture cup will be supplied. Leave your suitcase in the car. After surgery it may be brought to your room. For patients admitted to the hospital, discharge time is determined by your treatment team.   Patients discharged the day of surgery  will not be allowed to drive home.   Please read over the following fact sheets that you were given:   CHG soap  __X__ Take these medicines the morning of surgery with A SIP OF WATER:    1. hydrALAZINE (APRESOLINE) 100 MG tablet   2. cetirizine (ZYRTEC) 10 MG tablet if needed  3.   4.  5.  6.  ____ Fleet Enema (as directed)   __X__ Use CHG Soap/SAGE wipes as directed  ____ Use inhalers on the day of surgery  ____ Stop metformin/Janumet/Farxiga 2 days prior to surgery    ____ Take 1/2 of usual insulin dose the night before surgery. No insulin the morning          of surgery.   ____ Stop Blood Thinners Coumadin/Plavix/Xarelto/Pleta/Pradaxa/Eliquis/Effient/Aspirin  on   Or contact your Surgeon, Cardiologist or Medical Doctor regarding  ability to stop your blood thinners  __X__ Stop Anti-inflammatories 7 days before surgery such as Advil, Ibuprofen, Motrin,  BC or Goodies Powder, Naprosyn, Naproxen, Aleve,  Aspirin    __X__ Stop all herbals and supplements, fish oil or vitamins  until after surgery.    ____ Bring C-Pap to the hospital.    You may continue using your Tylenol as needed for pain.  You may use your nasal spray the morning of surgery if needed.

## 2022-10-27 ENCOUNTER — Encounter
Admission: RE | Admit: 2022-10-27 | Discharge: 2022-10-27 | Disposition: A | Discharge status: Home / Self Care | Payer: Medicare Other | Source: Ambulatory Visit | Attending: Orthopedic Surgery | Admitting: Orthopedic Surgery

## 2022-10-27 ENCOUNTER — Encounter: Payer: Self-pay | Admitting: Urgent Care

## 2022-10-27 DIAGNOSIS — I498 Other specified cardiac arrhythmias: Secondary | ICD-10-CM | POA: Insufficient documentation

## 2022-10-27 DIAGNOSIS — Z01810 Encounter for preprocedural cardiovascular examination: Secondary | ICD-10-CM | POA: Diagnosis present

## 2022-10-27 DIAGNOSIS — I700 Atherosclerosis of aorta: Secondary | ICD-10-CM | POA: Diagnosis not present

## 2022-10-27 DIAGNOSIS — I10 Essential (primary) hypertension: Secondary | ICD-10-CM | POA: Insufficient documentation

## 2022-10-27 DIAGNOSIS — E785 Hyperlipidemia, unspecified: Secondary | ICD-10-CM | POA: Diagnosis not present

## 2022-10-31 ENCOUNTER — Encounter: Payer: Self-pay | Admitting: Orthopedic Surgery

## 2022-10-31 ENCOUNTER — Other Ambulatory Visit: Payer: Self-pay

## 2022-10-31 ENCOUNTER — Ambulatory Visit: Payer: Medicare Other | Admitting: Anesthesiology

## 2022-10-31 ENCOUNTER — Encounter
Admission: RE | Disposition: A | Discharge status: Home / Self Care | Payer: Self-pay | Source: Home / Self Care | Attending: Orthopedic Surgery

## 2022-10-31 ENCOUNTER — Ambulatory Visit
Admission: RE | Admit: 2022-10-31 | Discharge: 2022-10-31 | Disposition: A | Discharge status: Home / Self Care | Payer: Medicare Other | Attending: Orthopedic Surgery | Admitting: Orthopedic Surgery

## 2022-10-31 ENCOUNTER — Ambulatory Visit: Payer: Medicare Other

## 2022-10-31 DIAGNOSIS — Z87891 Personal history of nicotine dependence: Secondary | ICD-10-CM | POA: Diagnosis not present

## 2022-10-31 DIAGNOSIS — I10 Essential (primary) hypertension: Secondary | ICD-10-CM | POA: Diagnosis not present

## 2022-10-31 DIAGNOSIS — M75102 Unspecified rotator cuff tear or rupture of left shoulder, not specified as traumatic: Secondary | ICD-10-CM | POA: Diagnosis not present

## 2022-10-31 DIAGNOSIS — N400 Enlarged prostate without lower urinary tract symptoms: Secondary | ICD-10-CM | POA: Insufficient documentation

## 2022-10-31 HISTORY — PX: SHOULDER ARTHROSCOPY WITH OPEN ROTATOR CUFF REPAIR AND DISTAL CLAVICLE ACROMINECTOMY: SHX5683

## 2022-10-31 SURGERY — SHOULDER ARTHROSCOPY WITH OPEN ROTATOR CUFF REPAIR AND DISTAL CLAVICLE ACROMINECTOMY
Anesthesia: General | Laterality: Left

## 2022-10-31 MED ORDER — ACETAMINOPHEN 500 MG PO TABS
ORAL_TABLET | ORAL | Status: AC
  Filled 2022-10-31: qty 2

## 2022-10-31 MED ORDER — CHLORHEXIDINE GLUCONATE 0.12 % MT SOLN: 15.0000 mL | Freq: Once | OROMUCOSAL | Status: AC

## 2022-10-31 MED ORDER — DEXMEDETOMIDINE HCL IN NACL 80 MCG/20ML IV SOLN
INTRAVENOUS | Status: AC
  Filled 2022-10-31: qty 20

## 2022-10-31 MED ORDER — FAMOTIDINE 20 MG PO TABS: 20.0000 mg | ORAL_TABLET | Freq: Once | ORAL | Status: AC

## 2022-10-31 MED ORDER — ONDANSETRON HCL 4 MG/2ML IJ SOLN
INTRAMUSCULAR | Status: AC
  Filled 2022-10-31: qty 2

## 2022-10-31 MED ORDER — ROCURONIUM BROMIDE 10 MG/ML (PF) SYRINGE
PREFILLED_SYRINGE | INTRAVENOUS | Status: AC
  Filled 2022-10-31: qty 10

## 2022-10-31 MED ORDER — CHLORHEXIDINE GLUCONATE CLOTH 2 % EX PADS: 6.0000 | MEDICATED_PAD | Freq: Once | CUTANEOUS | Status: DC

## 2022-10-31 MED ORDER — LIDOCAINE HCL (CARDIAC) PF 100 MG/5ML IV SOSY
PREFILLED_SYRINGE | INTRAVENOUS | Status: DC | PRN
  Administered 2022-10-31: 100 mg via INTRAVENOUS

## 2022-10-31 MED ORDER — LIDOCAINE HCL (PF) 2 % IJ SOLN
INTRAMUSCULAR | Status: AC
  Filled 2022-10-31: qty 5

## 2022-10-31 MED ORDER — CEFAZOLIN SODIUM-DEXTROSE 2-4 GM/100ML-% IV SOLN
2.0000 g | INTRAVENOUS | Status: AC
  Administered 2022-10-31: 2 g via INTRAVENOUS

## 2022-10-31 MED ORDER — CEFAZOLIN SODIUM-DEXTROSE 2-4 GM/100ML-% IV SOLN
INTRAVENOUS | Status: AC
  Filled 2022-10-31: qty 100

## 2022-10-31 MED ORDER — SUGAMMADEX SODIUM 200 MG/2ML IV SOLN
INTRAVENOUS | Status: DC | PRN
  Administered 2022-10-31: 200 mg via INTRAVENOUS

## 2022-10-31 MED ORDER — BUPIVACAINE HCL (PF) 0.5 % IJ SOLN
INTRAMUSCULAR | Status: AC
  Filled 2022-10-31: qty 10

## 2022-10-31 MED ORDER — GLYCOPYRROLATE 0.2 MG/ML IJ SOLN
INTRAMUSCULAR | Status: DC | PRN
  Administered 2022-10-31: .1 mg via INTRAVENOUS

## 2022-10-31 MED ORDER — ACETAMINOPHEN 10 MG/ML IV SOLN
INTRAVENOUS | Status: DC | PRN
  Administered 2022-10-31: 1000 mg via INTRAVENOUS

## 2022-10-31 MED ORDER — ORAL CARE MOUTH RINSE: 15.0000 mL | Freq: Once | OROMUCOSAL | Status: AC

## 2022-10-31 MED ORDER — DEXAMETHASONE SODIUM PHOSPHATE 10 MG/ML IJ SOLN
INTRAMUSCULAR | Status: DC | PRN
  Administered 2022-10-31: 5 mg via INTRAVENOUS

## 2022-10-31 MED ORDER — ONDANSETRON HCL 4 MG PO TABS: 4.0000 mg | ORAL_TABLET | Freq: Three times a day (TID) | ORAL | 0 refills | Status: DC | PRN

## 2022-10-31 MED ORDER — ACETAMINOPHEN 500 MG PO TABS: 1000.0000 mg | ORAL_TABLET | ORAL | Status: DC

## 2022-10-31 MED ORDER — ONDANSETRON HCL 4 MG/2ML IJ SOLN
INTRAMUSCULAR | Status: DC | PRN
  Administered 2022-10-31: 4 mg via INTRAVENOUS

## 2022-10-31 MED ORDER — OXYCODONE HCL 5 MG/5ML PO SOLN: 5.0000 mg | Freq: Once | ORAL | Status: DC | PRN

## 2022-10-31 MED ORDER — PHENYLEPHRINE HCL-NACL 20-0.9 MG/250ML-% IV SOLN
INTRAVENOUS | Status: DC | PRN
  Administered 2022-10-31: 20 ug/min via INTRAVENOUS

## 2022-10-31 MED ORDER — EPHEDRINE 5 MG/ML INJ
INTRAVENOUS | Status: AC
  Filled 2022-10-31: qty 5

## 2022-10-31 MED ORDER — FENTANYL CITRATE PF 50 MCG/ML IJ SOSY
PREFILLED_SYRINGE | INTRAMUSCULAR | Status: AC
  Administered 2022-10-31: 50 ug via INTRAVENOUS
  Filled 2022-10-31: qty 1

## 2022-10-31 MED ORDER — EPINEPHRINE PF 1 MG/ML IJ SOLN
INTRAMUSCULAR | Status: AC
  Filled 2022-10-31: qty 4

## 2022-10-31 MED ORDER — LACTATED RINGERS IR SOLN
Status: DC | PRN
  Administered 2022-10-31: 12004 mL

## 2022-10-31 MED ORDER — EPHEDRINE SULFATE (PRESSORS) 50 MG/ML IJ SOLN
INTRAMUSCULAR | Status: DC | PRN
  Administered 2022-10-31: 10 mg via INTRAVENOUS

## 2022-10-31 MED ORDER — PROPOFOL 10 MG/ML IV BOLUS
INTRAVENOUS | Status: AC
  Filled 2022-10-31: qty 20

## 2022-10-31 MED ORDER — GLYCOPYRROLATE 0.2 MG/ML IJ SOLN
INTRAMUSCULAR | Status: AC
  Filled 2022-10-31: qty 1

## 2022-10-31 MED ORDER — FENTANYL CITRATE PF 50 MCG/ML IJ SOSY: 50.0000 ug | PREFILLED_SYRINGE | Freq: Once | INTRAMUSCULAR | Status: AC

## 2022-10-31 MED ORDER — RINGERS IRRIGATION IR SOLN
Status: DC | PRN
  Administered 2022-10-31: 6000 mL

## 2022-10-31 MED ORDER — CHLORHEXIDINE GLUCONATE CLOTH 2 % EX PADS
6.0000 | MEDICATED_PAD | Freq: Once | CUTANEOUS | Status: AC
  Administered 2022-10-31: 6 via TOPICAL

## 2022-10-31 MED ORDER — ACETAMINOPHEN 10 MG/ML IV SOLN
INTRAVENOUS | Status: AC
  Filled 2022-10-31: qty 100

## 2022-10-31 MED ORDER — ROCURONIUM BROMIDE 100 MG/10ML IV SOLN
INTRAVENOUS | Status: DC | PRN
  Administered 2022-10-31: 60 mg via INTRAVENOUS
  Administered 2022-10-31: 30 mg via INTRAVENOUS

## 2022-10-31 MED ORDER — DEXMEDETOMIDINE HCL IN NACL 80 MCG/20ML IV SOLN
INTRAVENOUS | Status: DC | PRN
  Administered 2022-10-31: 8 ug via BUCCAL

## 2022-10-31 MED ORDER — FAMOTIDINE 20 MG PO TABS
ORAL_TABLET | ORAL | Status: AC
  Administered 2022-10-31: 20 mg via ORAL
  Filled 2022-10-31: qty 1

## 2022-10-31 MED ORDER — PHENYLEPHRINE HCL-NACL 20-0.9 MG/250ML-% IV SOLN
INTRAVENOUS | Status: AC
  Filled 2022-10-31: qty 250

## 2022-10-31 MED ORDER — BUPIVACAINE LIPOSOME 1.3 % IJ SUSP
INTRAMUSCULAR | Status: DC | PRN
  Administered 2022-10-31: 7 mL via PERINEURAL
  Administered 2022-10-31: 13 mL via PERINEURAL

## 2022-10-31 MED ORDER — BUPIVACAINE LIPOSOME 1.3 % IJ SUSP
INTRAMUSCULAR | Status: AC
  Filled 2022-10-31: qty 20

## 2022-10-31 MED ORDER — OXYCODONE HCL 5 MG PO TABS: 5.0000 mg | ORAL_TABLET | Freq: Once | ORAL | Status: DC | PRN

## 2022-10-31 MED ORDER — FENTANYL CITRATE (PF) 100 MCG/2ML IJ SOLN: 25.0000 ug | INTRAMUSCULAR | Status: DC | PRN

## 2022-10-31 MED ORDER — FENTANYL CITRATE (PF) 100 MCG/2ML IJ SOLN
INTRAMUSCULAR | Status: DC | PRN
  Administered 2022-10-31 (×2): 50 ug via INTRAVENOUS

## 2022-10-31 MED ORDER — CHLORHEXIDINE GLUCONATE 0.12 % MT SOLN
OROMUCOSAL | Status: AC
  Administered 2022-10-31: 15 mL via OROMUCOSAL
  Filled 2022-10-31: qty 15

## 2022-10-31 MED ORDER — LACTATED RINGERS IV SOLN: INTRAVENOUS | Status: DC

## 2022-10-31 MED ORDER — OXYCODONE HCL 5 MG PO TABS: 5.0000 mg | ORAL_TABLET | ORAL | 0 refills | Status: DC | PRN

## 2022-10-31 MED ORDER — DEXAMETHASONE SODIUM PHOSPHATE 10 MG/ML IJ SOLN
INTRAMUSCULAR | Status: AC
  Filled 2022-10-31: qty 1

## 2022-10-31 MED ORDER — PROPOFOL 10 MG/ML IV BOLUS
INTRAVENOUS | Status: DC | PRN
  Administered 2022-10-31: 90 mg via INTRAVENOUS

## 2022-10-31 MED ORDER — BUPIVACAINE HCL (PF) 0.5 % IJ SOLN
INTRAMUSCULAR | Status: DC | PRN
  Administered 2022-10-31: 7 mL via PERINEURAL
  Administered 2022-10-31: 3 mL via PERINEURAL

## 2022-10-31 MED ORDER — FENTANYL CITRATE (PF) 100 MCG/2ML IJ SOLN
INTRAMUSCULAR | Status: AC
  Filled 2022-10-31: qty 2

## 2022-10-31 SURGICAL SUPPLY — 67 items
ADAPTER IRRIG TUBE 2 SPIKE SOL (ADAPTER) ×4 IMPLANT
ADPR TBG 2 SPK PMP STRL ASCP (ADAPTER) ×2
ANCH SUT 5.5 KNTLS PEEK (Orthopedic Implant) ×2 IMPLANT
ANCH SUT Q-FX 2.8 (Anchor) ×3 IMPLANT
ANCHOR ALL-SUT Q-FIX 2.8 (Anchor) ×8 IMPLANT
ANCHOR SUT 5.5 MULTIFIX (Orthopedic Implant) IMPLANT
BLADE SHAVER 4.5X7 STR FR (MISCELLANEOUS) IMPLANT
CANNULA 5.75X7 CRYSTAL CLEAR (CANNULA) ×4 IMPLANT
CANNULA PARTIAL THREAD 2X7 (CANNULA) ×2 IMPLANT
CANNULA TWIST IN 8.25X9CM (CANNULA) ×4 IMPLANT
CONNECTOR PERFECT PASSER (CONNECTOR) ×4 IMPLANT
COOLER POLAR GLACIER W/PUMP (MISCELLANEOUS) ×2 IMPLANT
DEVICE SUCT BLK HOLE OR FLOOR (MISCELLANEOUS) ×4 IMPLANT
DRAPE U-SHAPE 47X51 STRL (DRAPES) ×2 IMPLANT
DURAPREP 26ML APPLICATOR (WOUND CARE) ×6 IMPLANT
ELECT REM PT RETURN 9FT ADLT (ELECTROSURGICAL) ×1
ELECTRODE REM PT RTRN 9FT ADLT (ELECTROSURGICAL) ×2 IMPLANT
GAUZE SPONGE 4X4 12PLY STRL (GAUZE/BANDAGES/DRESSINGS) ×2 IMPLANT
GAUZE XEROFORM 1X8 LF (GAUZE/BANDAGES/DRESSINGS) ×2 IMPLANT
GLOVE BIOGEL PI IND STRL 9 (GLOVE) ×2 IMPLANT
GLOVE BIOGEL PI ORTHO SZ9 (GLOVE) ×12 IMPLANT
GOWN STRL REUS TWL 2XL XL LVL4 (GOWN DISPOSABLE) ×2 IMPLANT
GOWN STRL REUS W/ TWL LRG LVL3 (GOWN DISPOSABLE) ×2 IMPLANT
GOWN STRL REUS W/TWL LRG LVL3 (GOWN DISPOSABLE) ×1
IV LACTATED RINGER IRRG 3000ML (IV SOLUTION) ×6
IV LR IRRIG 3000ML ARTHROMATIC (IV SOLUTION) ×16 IMPLANT
KIT STABILIZATION SHOULDER (MISCELLANEOUS) IMPLANT
KIT SUTURE 2.8 Q-FIX DISP (MISCELLANEOUS) ×2 IMPLANT
KIT SUTURETAK 3.0 INSERT PERC (KITS) IMPLANT
KIT TURNOVER KIT A (KITS) ×2 IMPLANT
MANIFOLD NEPTUNE II (INSTRUMENTS) ×4 IMPLANT
MASK FACE SPIDER DISP (MASK) ×2 IMPLANT
MAT ABSORB  FLUID 56X50 GRAY (MISCELLANEOUS) ×2
MAT ABSORB FLUID 56X50 GRAY (MISCELLANEOUS) ×6 IMPLANT
NDL SAFETY ECLIP 18X1.5 (MISCELLANEOUS) ×2 IMPLANT
NEEDLE HYPO 22GX1.5 SAFETY (NEEDLE) ×2 IMPLANT
PACK ARTHROSCOPY SHOULDER (MISCELLANEOUS) ×2 IMPLANT
PAD ABD DERMACEA PRESS 5X9 (GAUZE/BANDAGES/DRESSINGS) ×2 IMPLANT
PAD ARMBOARD 7.5X6 YLW CONV (MISCELLANEOUS) ×4 IMPLANT
PAD WRAPON POLAR SHDR XLG (MISCELLANEOUS) ×2 IMPLANT
PASSER SUT FIRSTPASS SELF (INSTRUMENTS) ×2 IMPLANT
SHAVER BLADE BONE CUTTER 4.5 (BLADE) ×2 IMPLANT
SHAVER BLADE TAPERED BLUNT 4 (BLADE) ×2 IMPLANT
SLEEVE REMOTE CONTROL 5X12 (DRAPES) IMPLANT
SPONGE T-LAP 18X18 ~~LOC~~+RFID (SPONGE) ×2 IMPLANT
STRIP CLOSURE SKIN 1/2X4 (GAUZE/BANDAGES/DRESSINGS) ×2 IMPLANT
SUT ETHILON 4-0 (SUTURE) ×1
SUT ETHILON 4-0 FS2 18XMFL BLK (SUTURE) ×1
SUT LASSO 90 DEG SD STR (SUTURE) IMPLANT
SUT MNCRL 4-0 (SUTURE) ×1
SUT MNCRL 4-0 27 PS-2 XMFL (SUTURE) ×1
SUT MNCRL 4-0 27XMFL (SUTURE) ×1
SUT PDS AB 0 CT1 27 (SUTURE) ×6 IMPLANT
SUT PERFECTPASSER WHITE CART (SUTURE) ×8 IMPLANT
SUT SMART STITCH CARTRIDGE (SUTURE) ×8 IMPLANT
SUT ULTRABRAID 2 COBRAID 38 (SUTURE) IMPLANT
SUT VIC AB 0 CT1 36 (SUTURE) ×6 IMPLANT
SUT VIC AB 2-0 CT2 27 (SUTURE) ×2 IMPLANT
SUTURE ETHLN 4-0 FS2 18XMF BLK (SUTURE) ×2 IMPLANT
SUTURE MNCRL 4-0 27XMF (SUTURE) ×2 IMPLANT
TAPE MICROFOAM 4IN (TAPE) ×2 IMPLANT
TRAP FLUID SMOKE EVACUATOR (MISCELLANEOUS) ×2 IMPLANT
TUBING CONNECTING 10 (TUBING) ×2 IMPLANT
TUBING INFLOW SET DBFLO PUMP (TUBING) ×2 IMPLANT
TUBING OUTFLOW SET DBLFO PUMP (TUBING) ×2 IMPLANT
WAND WEREWOLF FLOW 90D (MISCELLANEOUS) ×2 IMPLANT
WRAPON POLAR PAD SHDR XLG (MISCELLANEOUS) ×1

## 2022-10-31 NOTE — Anesthesia Procedure Notes (Signed)
Procedure Name: Intubation Date/Time: 10/31/2022 8:08 AM  Performed by: Cammie Sickle, CRNAPre-anesthesia Checklist: Patient identified, Patient being monitored, Timeout performed, Emergency Drugs available and Suction available Patient Re-evaluated:Patient Re-evaluated prior to induction Oxygen Delivery Method: Circle system utilized Preoxygenation: Pre-oxygenation with 100% oxygen Induction Type: IV induction Ventilation: Mask ventilation without difficulty Laryngoscope Size: 3 and McGraph Grade View: Grade I Tube type: Oral Tube size: 7.0 mm Number of attempts: 1 Airway Equipment and Method: Stylet Placement Confirmation: ETT inserted through vocal cords under direct vision, positive ETCO2 and breath sounds checked- equal and bilateral Secured at: 23 cm Tube secured with: Tape Dental Injury: Teeth and Oropharynx as per pre-operative assessment

## 2022-10-31 NOTE — Anesthesia Procedure Notes (Signed)
Anesthesia Regional Block: Interscalene brachial plexus block   Pre-Anesthetic Checklist: , timeout performed,  Correct Patient, Correct Site, Correct Laterality,  Correct Procedure, Correct Position, site marked,  Risks and benefits discussed,  Surgical consent,  Pre-op evaluation,  At surgeon's request and post-op pain management  Laterality: Upper and Left  Prep: chloraprep       Needles:  Injection technique: Single-shot  Needle Type: Stimiplex     Needle Length: 9cm  Needle Gauge: 22     Additional Needles:   Procedures:,,,, ultrasound used (permanent image in chart),,    Narrative:  Start time: 10/31/2022 7:56 AM End time: 10/31/2022 7:58 AM Injection made incrementally with aspirations every 5 mL.  Performed by: Personally  Anesthesiologist: Harvin Konicek, Precious Haws, MD  Additional Notes: Patient consented for risk and benefits of nerve block including but not limited to nerve damage, Horner's syndrome, failed block, bleeding and infection.  Patient voiced understanding.  Functioning IV was confirmed and monitors were applied.  Timeout done prior to procedure and prior to any sedation being given to the patient.  Patient confirmed procedure site prior to any sedation given to the patient.  A 75m 22ga Stimuplex needle was used. Sterile prep,hand hygiene and sterile gloves were used.  Minimal sedation used for procedure.  No paresthesia endorsed by patient during the procedure.  Negative aspiration and negative test dose prior to incremental administration of local anesthetic. The patient tolerated the procedure well with no immediate complications.

## 2022-10-31 NOTE — Anesthesia Postprocedure Evaluation (Signed)
Anesthesia Post Note  Patient: Brian Little  Procedure(s) Performed: SHOULDER ARTHROSCOPY WITH OPEN ROTATOR CUFF REPAIR AND DISTAL CLAVICLE ACROMINECTOMY (Left)  Patient location during evaluation: PACU Anesthesia Type: General Level of consciousness: awake and alert Pain management: pain level controlled Vital Signs Assessment: post-procedure vital signs reviewed and stable Respiratory status: spontaneous breathing, nonlabored ventilation, respiratory function stable and patient connected to nasal cannula oxygen Cardiovascular status: blood pressure returned to baseline and stable Postop Assessment: no apparent nausea or vomiting Anesthetic complications: no   No notable events documented.   Last Vitals:  Vitals:   10/31/22 1215 10/31/22 1226  BP:  121/63  Pulse: 67 66  Resp: (!) 22 16  Temp:  36.4 C  SpO2: 96% 97%    Last Pain:  Vitals:   10/31/22 1226  TempSrc: Temporal  PainSc: 0-No pain                 Precious Haws Isabelle Matt

## 2022-10-31 NOTE — H&P (Signed)
PREOPERATIVE H&P  Chief Complaint: Left Shoulder Rotator Cuff Tear  HPI: Brian Little is a 88 y.o. male who presents for preoperative history and physical with a diagnosis of Left Shoulder Rotator Cuff Tear confirmed by MRI. Symptoms of pain and weakness are significantly impairing activities of daily living.  He has failed nonoperative management wished to proceed with surgical fixation of his rotator cuff tear.  Patient has previously undergone successful rotator cuff surgery in the past..   Past Medical History:  Diagnosis Date   BPH (benign prostatic hyperplasia)    Cancer ([place])    skin   Hypertension    Past Surgical History:  Procedure Laterality Date   ADENOIDECTOMY     88 yrs old   PARTIAL COLECTOMY     polyps/ appendix removed at same time   ROTATOR CUFF REPAIR     x 2, 2008, 2011   ROTATOR CUFF REPAIR     left and right   TONSILLECTOMY     88 years old   Social History   Socioeconomic History   Marital status: Married    Spouse name: Not on file   Number of children: Not on file   Years of education: Not on file   Highest education level: Not on file  Occupational History   Not on file  Tobacco Use   Smoking status: Former    Types: Cigarettes    Quit date: 01/10/1980    Years since quitting: 42.8   Smokeless tobacco: Never  Vaping Use   Vaping Use: Never used  Substance and Sexual Activity   Alcohol use: Never   Drug use: Never   Sexual activity: Yes    Birth control/protection: None  Other Topics Concern   Not on file  Social History Narrative   Not on file   Social Determinants of Health   Financial Resource Strain: Not on file  Food Insecurity: Not on file  Transportation Needs: Not on file  Physical Activity: Not on file  Stress: Not on file  Social Connections: Not on file   Family History  Problem Relation Age of Onset   Heart disease Father    Congestive Heart Failure Father    COPD Mother    Hypertension Mother    Allergies   Allergen Reactions   Bee Venom Anaphylaxis   Caffeine Other (See Comments)    Other reaction(s): Other Vertigo vertigo    Ibuprofen     Other reaction(s): Other (See Comments), Other (See Comments) vertigo vertigo    Nsaids Other (See Comments)    Other reaction(s): Other (See Comments) Vertigo and n/v Vertigo and n/v    Prior to Admission medications   Medication Sig Start Date End Date Taking? Authorizing Provider  acetaminophen (TYLENOL) 500 MG tablet Take 500 mg by mouth every 6 (six) hours as needed.   Yes [provider]  amLODipine (NORVASC) 10 MG tablet Take 10 mg by mouth at bedtime. 05/06/18  Yes [provider]  azelastine (ASTELIN) 0.1 % nasal spray Place 1-2 sprays into both nostrils in the morning and at bedtime. 11/10/21  Yes [provider]  cetirizine (ZYRTEC) 10 MG tablet 10 mg daily as needed for allergies. 01/21/13  Yes [provider]  fluticasone (FLONASE) 50 MCG/ACT nasal spray Place 1 spray into both nostrils 2 (two) times daily. 11/10/21  Yes [provider]  hydrALAZINE (APRESOLINE) 100 MG tablet Take 100 mg by mouth 2 (two) times daily. 01/25/22  Yes [provider]  losartan (COZAAR) 100 MG tablet Take 100 mg by mouth daily. 05/06/18  Yes [provider]  silodosin (RAPAFLO) 4 MG CAPS capsule Take 1 capsule (4 mg total) by mouth daily with breakfast. 09/22/22  Yes Stoioff, Ronda Fairly, MD  tadalafil (CIALIS) 5 MG tablet Take 1 tablet (5 mg total) by mouth daily. 09/22/22  Yes Stoioff, Ronda Fairly, MD  traZODone (DESYREL) 50 MG tablet Take 50-100 mg by mouth at bedtime. 10/26/21  Yes [provider]     Positive ROS: All other systems have been reviewed and were otherwise negative with the exception of those mentioned in the HPI and as above.  Physical Exam: General: Alert, no acute distress Cardiovascular: Regular rate and rhythm, no murmurs rubs or gallops.  No pedal edema Respiratory: Clear to  auscultation bilaterally, no wheezes rales or rhonchi. No cyanosis, no use of accessory musculature GI: No organomegaly, abdomen is soft and non-tender nondistended with positive bowel sounds. Skin: Skin intact, no lesions within the operative field. Neurologic: Sensation intact distally Psychiatric: Patient is competent for consent with normal mood and affect Lymphatic: No cervical lymphadenopathy  MUSCULOSKELETAL: Left shoulder: Patient can forward elevate and abduct to 150 to 160 degrees with mild pain in full forward elevation and in the mid range of abduction. He has mild pain with impingement testing. He had no weakness to shoulder abduction, but demonstrated weakness to shoulder external rotation. He had no weakness with shoulder internal rotation. He has full digital wrist and elbow range of motion, intact sensation to light touch and a palpable radial pulse.  Assessment: Left Shoulder Rotator Cuff Tear  Plan: Plan for Procedure(s): Left shoulder arthroscopy with subacromial decompression, distal clavicle excision and mini open rotator cuff repair.   I reviewed the details of the operation as well as the postoperative course with the patient.  History and physical was performed at the bedside.  I marked the left shoulder according to hospital's correct site of surgery protocol.  I answered questions by the patient and his family.  Patient underwent placement of a interscalene block with Exparel by the anesthesia department in the preoperative area.  I discussed the risks and benefits of surgery. The risks include but are not limited to infection, bleeding, nerve or blood vessel injury, joint stiffness or loss of motion, persistent pain, weakness or instability, and ability to repair the rotator cuff tear, retear of the rotator cuff, failure of the repair or hardware  and the need for further surgery. Medical risks include but are not limited to DVT and pulmonary embolism, myocardial  infarction, stroke, pneumonia, respiratory failure and death. Patient and his family understood these risks and wished to proceed.     Thornton Park, MD   10/31/2022 8:05 AM

## 2022-10-31 NOTE — Op Note (Addendum)
10/31/2022  11:58 AM  PATIENT:  Brian Little  88 y.o. male  PRE-OPERATIVE DIAGNOSIS:  Left Shoulder Rotator Cuff Tear  POST-OPERATIVE DIAGNOSIS: Left shoulder rotator cuff tear, high grade partial biceps tendon tear, subacromial impingement  PROCEDURE:  Left shoulder arthroscopic labral debridement, biceps tenotomy with mini open rotator cuff repair   SURGEON:  Surgeon(s) and Role:    Thornton Park, MD - Primary  ANESTHESIA:   general and paracervical block   PREOPERATIVE INDICATIONS:  Brian Little is a  88 y.o. male with a diagnosis of Left Shoulder Rotator Cuff Tear failed conservative treatment and elected for surgical fixation of his rotator cuff tear given his persistent pain and functional limitations with the left shoulder.    The risks benefits and alternatives were discussed with the patient preoperatively including but not limited to the risks of infection, bleeding, nerve injury, persistent pain or weakness, shoulder stiffness/arthrofibrosis, failure of the repair, re-tear of the rotator cuff and the need for further surgery. Medical risks include DVT and pulmonary embolism, myocardial infarction, stroke, pneumonia, respiratory failure and death. Patient understood these risks and wished to proceed.  OPERATIVE IMPLANTS: [place] MultiFix anchors x 2 & [employer] Q Fix anchors x 3  OPERATIVE FINDINGS: Patient had a near full-thickness tear of the biceps tendon at approximately the level of the top of the inner tuberous groove.  The biceps tendon was severely frayed.  Patient had a full-thickness tear involving the supra and infraspinatus.  He had a low-grade partial-thickness tear at the superior edge of the subscapularis without retraction.  Patient had diffuse fraying of the labrum without detachment.  OPERATIVE PROCEDURE: The patient was met in the preoperative area with his wife and daughter at the bedside. The left shoulder was signed with the word yes  and my initials according the hospital's correct site of surgery protocol.   A pre-op history and physical was performed at the bedside. Patient underwent an interscalene block with Exparel by the anesthesia service.  Patient was brought to the operating room where he underwent general endotracheal intubation.  The patient was placed in a beachchair position.  A spider arm positioner was used for this case.  Examination under anesthesia revealed no loss of passive range of motion or instability with load shift testing. The patient had a negative sulcus sign.  Patient was prepped and draped in a sterile fashion. A timeout was performed to verify the patient's name, date of birth, medical record number, correct site of surgery and correct procedure to be performed there was also used to verify the patient received antibiotics that all appropriate instruments, implants and radiographs studies were available in the room. Once all in attendance were in agreement case began.  Patient received ancef 2 grams IV for pre-op antibiotics.  Bony landmarks were drawn out with a surgical marker along with proposed arthroscopy incisions.  An 11 blade was used to establish a posterior portal through which the arthroscope was placed in the glenohumeral joint. A full diagnostic examination of the shoulder was performed.  Findings are noted above.   The arthroscope was then placed in the subacromial space.  A lateral portal was established under direct visualization using an 18-gauge spinal needle. A revision subacromial decompression was performed using a 4.5 mm [employer] bone cutter shaver blade from the lateral portal.   Patient had sufficient distance between the distal clavicle and acromion and a distal clavicle excision was not required.  Patient had a  previous surgery on the shoulder and a distal clavicle excision appeared to have been performed previously.  The bone cutter shaver blade was then used to debride the greater  tuberosity of all torn fibers of the rotator cuff.   Three Perfect Pass sutures were placed in the lateral border of the rotator cuff tear. All arthroscopic instruments were then removed and the mini-open portion of the procedure began.  A saber-type incision was made along the lateral border of the acromion. The deltoid muscle was identified and split in line with its fibers which allowed visualization of the rotator cuff. The Perfect Pass sutures previously placed in the lateral border of the rotator cuff were brought out through the deltoid split.  Two [employer] Fix anchors were placed at the articular margin of the humeral head with the greater tuberosity. The suture limbs of the Q Fix anchors were passed medially through the rotator cuff using a First Pass suture passer.  The lateral border of the rotator cuff was debrided with a #15 blade until healthy tissue was identified.  Two additional perfect pass sutures were placed in the lateral border of the rotator cuff.  The five Perfect Pass sutures were then anchored to the greater tuberosity footprint using two [employer] Multifix anchors. The sutures passed through the Multifix anchors were then tensioned to allow reduction of the rotator cuff to the greater tuberosity footprint. The medial row repair was then performed using the Q fix sutures, which were tied down using an arthroscopic knot tying technique.  A 30 Q fix anchor was placed in the anterior greater tuberosity.  The suture limbs from this anchor were passed through the anterior rotator cuff with a first pass suture passer.  Arthroscopic knots were used to tie down the anterior portion of the rotator cuff anterior to the previous 2 anchors to fix a dog ear of the anterior cuff.  Arthroscopic images of the repair were taken with the arthroscope both externally and from inside the glenohumeral joint.  Inside the glenohumeral joint the patient was found to have residual biceps tendon  attached to the superior labrum which was debrided with a tapered shaver blade from the anterior portal.  The biceps tendon stump was debrided back to the superior labrum.  All incisions were copiously irrigated. The deltoid fascia was repaired using a 0 Vicryl suture.  The subcutaneous tissue of all incisions were closed with a 2-0 Vicryl. Skin closure for the arthroscopic incisions was performed with 4-0 nylon. The skin edges of the saber incision was approximated with a running 4-0 undyed Monocryl.  A dry sterile dressing was applied.  The patient was placed in an abduction sling and a Polar Care was applied to the shoulder.  All sharp, sponge and it instrument counts were correct at the conclusion of the case. I was scrubbed and present for the entire case. I spoke with the patient's wife and daughter postoperatively to let them know the case had been performed without complication and the patient was stable in recovery room.  I reviewed in detail the postoperative instructions with them.  I answered all her questions.

## 2022-10-31 NOTE — Anesthesia Preprocedure Evaluation (Signed)
Anesthesia Evaluation  Patient identified by MRN, date of birth, ID band Patient awake    Reviewed: Allergy & Precautions, NPO status , Patient's Chart, lab work & pertinent test results  History of Anesthesia Complications Negative for: history of anesthetic complications  Airway Mallampati: III  TM Distance: >3 FB Neck ROM: full    Dental  (+) Chipped   Pulmonary neg pulmonary ROS, neg shortness of breath, neg COPD, former smoker   Pulmonary exam normal        Cardiovascular Exercise Tolerance: Good hypertension, (-) Past MI Normal cardiovascular exam     Neuro/Psych  Neuromuscular disease  negative psych ROS   GI/Hepatic negative GI ROS, Neg liver ROS,neg GERD  ,,  Endo/Other  negative endocrine ROS    Renal/GU      Musculoskeletal   Abdominal   Peds  Hematology negative hematology ROS (+)   Anesthesia Other Findings Past Medical History: No date: BPH (benign prostatic hyperplasia) No date: Cancer ([place])     Comment:  skin No date: Hypertension  Past Surgical History: No date: ADENOIDECTOMY     Comment:  88 yrs old No date: PARTIAL COLECTOMY     Comment:  polyps/ appendix removed at same time No date: ROTATOR CUFF REPAIR     Comment:  x 2, 2008, 2011 No date: ROTATOR CUFF REPAIR     Comment:  left and right No date: TONSILLECTOMY     Comment:  88 years old  BMI    Body Mass Index: 22.97 kg/m      Reproductive/Obstetrics negative OB ROS                             Anesthesia Physical Anesthesia Plan  ASA: 3  Anesthesia Plan: General ETT   Post-op Pain Management: Regional block*   Induction: Intravenous  PONV Risk Score and Plan: Ondansetron, Dexamethasone, Midazolam and Treatment may vary due to age or medical condition  Airway Management Planned: Oral ETT  Additional Equipment:   Intra-op Plan:   Post-operative Plan: Extubation in OR  Informed Consent: I  have reviewed the patients History and Physical, chart, labs and discussed the procedure including the risks, benefits and alternatives for the proposed anesthesia with the patient or authorized representative who has indicated his/her understanding and acceptance.     Dental Advisory Given  Plan Discussed with: Anesthesiologist, CRNA and Surgeon  Anesthesia Plan Comments: (Patient consented for risks of anesthesia including but not limited to:  - adverse reactions to medications - damage to eyes, teeth, lips or other oral mucosa - nerve damage due to positioning  - sore throat or hoarseness - Damage to heart, brain, nerves, lungs, other parts of body or loss of life  Patient voiced understanding.)       Anesthesia Quick Evaluation

## 2022-10-31 NOTE — Discharge Instructions (Addendum)
AMBULATORY SURGERY  DISCHARGE INSTRUCTIONS   The drugs that you were given will stay in your system until tomorrow so for the next 24 hours you should not:  Drive an automobile Make any legal decisions Drink any alcoholic beverage   You may resume regular meals tomorrow.  Today it is better to start with liquids and gradually work up to solid foods.  You may eat anything you prefer, but it is better to start with liquids, then soup and crackers, and gradually work up to solid foods.   Please notify your doctor immediately if you have any unusual bleeding, trouble breathing, redness and pain at the surgery site, drainage, fever, or pain not relieved by medication.    Additional Instructions:  PLEASE LEAVE GREEN ARMBAND ON FOR 4 DAYS    Please contact your physician with any problems or Same Day Surgery at [phone], Monday through Friday 6 am to 4 pm, or [place] at [facility] number at [phone].  SHOULDER SLING IMMOBILIZER   VIDEO Slingshot 2 Shoulder Brace Application - YouTube ---[email or web address]  INSTRUCTIONS While supporting the injured arm, slide the forearm into the sling. Wrap the adjustable shoulder strap around the neck and shoulders and attach the strap end to the sling using  the "alligator strap tab."  Adjust the shoulder strap to the required length. Position the shoulder pad behind the neck. To secure the shoulder pad location (optional), pull the shoulder strap away from the shoulder pad, unfold the hook material on the top of the pad, then press the shoulder strap back onto the hook material to secure the pad in place. Attach the closure strap across the open top of the sling. Position the strap so that it holds the arm securely in the sling. Next, attach the thumb strap to the open end of the sling between the thumb and fingers. After sling has been fit, it may be easily removed and reapplied using the quick release  buckle on shoulder strap. If a neutral pillow or 15 abduction pillow is included, place the pillow at the waistline. Attach the sling to the pillow, lining up hook material on the pillow with the loop on sling. Adjust the waist strap to fit.  If waist strap is too long, cut it to fit. Use the small piece of double sided hook material (located on top of the pillow) to secure the strap end. Place the double sided hook material on the inside of the cut strap end and secure it to the waist strap.     If no pillow is included, attach the waist strap to the sling and adjust to fit.    Washing Instructions: Straps and sling must be removed and cleaned regularly depending on your activity level and perspiration. Hand wash straps and sling in cold water with mild detergent, rinse, air dry     POLAR CARE INFORMATION  [email or web address]  How to use [place]?  YouTube   [email or web address]  OPERATING INSTRUCTIONS  Start the product With dry hands, connect the transformer to the electrical connection located on the top of the cooler. Next, plug the transformer into an appropriate electrical outlet. The unit will automatically start running at this point.  To stop the pump, disconnect electrical power.  Unplug to stop the product when not in use. Unplugging the Polar Care unit turns it off. Always unplug immediately after use. Never leave it plugged in while unattended. Remove pad.    FIRST ADD WATER  TO FILL LINE, THEN ICE---Replace ice when existing ice is almost melted  1 Discuss Treatment with your [place] Practitioner and Use Only as Prescribed 2 Apply Insulation Barrier & Cold Therapy Pad 3 Check for Moisture 4 Inspect Skin Regularly  Tips and Trouble Shooting Usage Tips 1. Use cubed or chunked ice for optimal performance. 2. It is recommended to drain the Pad between uses. To drain the pad, hold the Pad upright with the hose  pointed toward the ground. Depress the black plunger and allow water to drain out. 3. You may disconnect the Pad from the unit without removing the pad from the affected area by depressing the silver tabs on the hose coupling and gently pulling the hoses apart. The Pad and unit will seal itself and will not leak. Note: Some dripping during release is normal. 4. DO NOT RUN PUMP WITHOUT WATER! The pump in this unit is designed to run with water. Running the unit without water will cause permanent damage to the pump. 5. Unplug unit before removing lid.  TROUBLESHOOTING GUIDE Pump not running, Water not flowing to the pad, Pad is not getting cold 1. Make sure the transformer is plugged into the wall outlet. 2. Confirm that the ice and water are filled to the indicated levels. 3. Make sure there are no kinks in the pad. 4. Gently pull on the blue tube to make sure the tube/pad junction is straight. 5. Remove the pad from the treatment site and ll it while the pad is lying at; then reapply. 6. Confirm that the pad couplings are securely attached to the unit. Listen for the double clicks (Figure 1) to confirm the pad couplings are securely attached.  Leaks    Note: Some condensation on the lines, controller, and pads is unavoidable, especially in warmer climates. 1. If using a [place] Polar Care Cold Therapy unit with a detachable Cold Therapy Pad, and a leak exists (other than condensation on the lines) disconnect the pad couplings. Make sure the silver tabs on the couplings are depressed before reconnecting the pad to the pump hose; then confirm both sides of the coupling are properly clicked in. 2. If the coupling continues to leak or a leak is detected in the pad itself, stop using it and call [place] at [phone].  Cleaning After use, empty and dry the unit with a soft cloth. Warm water and mild detergent may be used occasionally to clean the pump and tubes.  WARNING: The [place] can be cold enough to cause serious injury, including full skin necrosis. Follow these Operating Instructions, and carefully read the Product Insert (see pouch on side of unit) and the Cold Therapy Pad Fitting Instructions (provided with each Cold Therapy Pad) prior to use.      Interscalene Nerve Block with Exparel   For your surgery you have received an Interscalene Nerve Block with Exparel. Nerve Blocks affect many types of nerves, including nerves that control movement, pain and normal sensation.  You may experience feelings such as numbness, tingling, heaviness, weakness or the inability to move your arm or the feeling or sensation that your arm has "fallen asleep". A nerve block with Exparel can last up to 5 days.  Usually the weakness wears off first.  The tingling and heaviness usually wear off next.  Finally you may start to notice pain.  Keep in mind that this may occur in any order.  Once a nerve block starts to  wear off it is usually completely gone within 60 minutes. ISNB may cause mild shortness of breath, a hoarse voice, blurry vision, unequal pupils, or drooping of the face on the same side as the nerve block.  These symptoms will usually resolve with the numbness.  Very rarely the procedure itself can cause mild seizures. If needed, your surgeon will give you a prescription for pain medication.  It will take about 60 minutes for the oral pain medication to become fully effective.  So, it is recommended that you start taking this medication before the nerve block first begins to wear off, or when you first begin to feel discomfort. Take your pain medication only as prescribed.  Pain medication can cause sedation and decrease your breathing if you take more than you need for the level of pain that you have. Nausea is a common side effect of many pain medications.  You may want to eat something before taking your pain medicine to prevent nausea. After an Interscalene nerve block, you  cannot feel pain, pressure or extremes in temperature in the effected arm.  Because your arm is numb it is at an increased risk for injury.  To decrease the possibility of injury, please practice the following:  While you are awake change the position of your arm frequently to prevent too much pressure on any one area for prolonged periods of time.  If you have a cast or tight dressing, check the color or your fingers every couple of hours.  Call your surgeon with the appearance of any discoloration (white or blue). If you are given a sling to wear before you go home, please wear it  at all times until the block has completely worn off.  Do not get up at night without your sling. Please contact [facility] or your surgeon if you do not begin to regain sensation after 7 days from the surgery.  Anesthesia may be contacted by calling the Same Day Surgery Department, Mon. through Fri., 6 am to 4 pm at [phone].   If you experience any other problems or concerns, please contact your surgeon's office. If you experience severe or prolonged shortness of breath go to the nearest emergency department.

## 2022-10-31 NOTE — Transfer of Care (Signed)
Immediate Anesthesia Transfer of Care Note  Patient: Brian Little  Procedure(s) Performed: SHOULDER ARTHROSCOPY WITH OPEN ROTATOR CUFF REPAIR AND DISTAL CLAVICLE ACROMINECTOMY (Left)  Patient Location: PACU  Anesthesia Type:General  Level of Consciousness: drowsy  Airway & Oxygen Therapy: Patient Spontanous Breathing and Patient connected to face mask oxygen  Post-op Assessment: Report given to RN and Post -op Vital signs reviewed and stable  Post vital signs: Reviewed and stable  Last Vitals:  Vitals Value Taken Time  BP 149/75 10/31/22 1125  Temp 35.9 1126  Pulse 66 10/31/22 1130  Resp 25 10/31/22 1130  SpO2 100 % 10/31/22 1130  Vitals shown include unvalidated device data.  Last Pain:  Vitals:   10/31/22 0758  TempSrc:   PainSc: 0-No pain         Complications: No notable events documented.

## 2022-11-01 ENCOUNTER — Encounter: Payer: Self-pay | Admitting: Orthopedic Surgery

## 2022-11-16 ENCOUNTER — Ambulatory Visit: Payer: Medicare Other | Admitting: Urology

## 2022-11-22 ENCOUNTER — Ambulatory Visit: Payer: Medicare Other | Admitting: Urology

## 2023-01-08 ENCOUNTER — Ambulatory Visit: Payer: Medicare Other

## 2023-01-08 ENCOUNTER — Inpatient Hospital Stay (HOSPITAL_COMMUNITY): Admission: RE | Admit: 2023-01-08 | Payer: Medicare Other | Source: Ambulatory Visit

## 2023-01-23 NOTE — Progress Notes (Unsigned)
01/24/2023 7:09 PM   Brian Little 08/06/1934 [id number]  Referring provider: Adin Hector, MD [facility] [place],  [place] [address]  Urological history: 1. BPH with LU TS -PSA (2019) 4.4 -I PSS *** -PVR *** -silodosin 4 mg daily  -tadalafil 5 mg daily  2. Nocturia -Risk factors for nocturia: ?obstructive sleep apnea, hypertension, arthritis, asthma, heart disease and BPH -PVR *** -failed DDAVP, OAB medications  -Gemtesa 75 mg daily   3. Erectile dysfunction -contributing factors of age, BPH, HTN, former smoker and heart disease -tadalafil 5 mg daily  4. Elevated PSA -biopsy (1988) negative   No chief complaint on file.   HPI: Brian Little is a 89 y.o. male who presents today for nocturia is worsening.  I PSS ***  UA ***  PVR ***    Score:  1-7 Mild 8-19 Moderate 20-35 Severe    PMH: Past Medical History:  Diagnosis Date   BPH (benign prostatic hyperplasia)    Cancer ([place])    skin   Hypertension     Surgical History: Past Surgical History:  Procedure Laterality Date   ADENOIDECTOMY     89 yrs old   PARTIAL COLECTOMY     polyps/ appendix removed at same time   ROTATOR CUFF REPAIR     x 2, 2008, 2011   ROTATOR CUFF REPAIR     left and right   SHOULDER ARTHROSCOPY WITH OPEN ROTATOR CUFF REPAIR AND DISTAL CLAVICLE ACROMINECTOMY Left 10/31/2022   Procedure: SHOULDER ARTHROSCOPY WITH OPEN ROTATOR CUFF REPAIR AND DISTAL CLAVICLE ACROMINECTOMY;  Surgeon: Thornton Park, MD;  Location: [facility] ORS;  Service: Orthopedics;  Laterality: Left;   TONSILLECTOMY     89 years old    Home Medications:  Allergies as of 01/24/2023       Reactions   Bee Venom Anaphylaxis   Caffeine Other (See Comments)   Other reaction(s): Other Vertigo vertigo   Ibuprofen    Other reaction(s): Other (See Comments), Other (See Comments) vertigo vertigo   Nsaids Other (See Comments)   Other reaction(s): Other (See  Comments) Vertigo and n/v Vertigo and n/v        Medication List        Accurate as of January 23, 2023  7:09 PM. If you have any questions, ask your nurse or doctor.          acetaminophen 500 MG tablet Commonly known as: TYLENOL Take 500 mg by mouth every 6 (six) hours as needed.   amLODipine 10 MG tablet Commonly known as: NORVASC Take 10 mg by mouth at bedtime.   azelastine 0.1 % nasal spray Commonly known as: ASTELIN Place 1-2 sprays into both nostrils in the morning and at bedtime.   cetirizine 10 MG tablet Commonly known as: ZYRTEC 10 mg daily as needed for allergies.   fluticasone 50 MCG/ACT nasal spray Commonly known as: FLONASE Place 1 spray into both nostrils 2 (two) times daily.   hydrALAZINE 100 MG tablet Commonly known as: APRESOLINE Take 100 mg by mouth 2 (two) times daily.   losartan 100 MG tablet Commonly known as: COZAAR Take 100 mg by mouth daily.   ondansetron 4 MG tablet Commonly known as: Zofran Take 1 tablet (4 mg total) by mouth every 8 (eight) hours as needed for nausea or vomiting.   oxyCODONE 5 MG immediate release tablet Commonly known as: Oxy IR/ROXICODONE Take 1 tablet (5 mg total) by mouth every 4 (four)  hours as needed for severe pain.   silodosin 4 MG Caps capsule Commonly known as: RAPAFLO Take 1 capsule (4 mg total) by mouth daily with breakfast.   tadalafil 5 MG tablet Commonly known as: CIALIS Take 1 tablet (5 mg total) by mouth daily.   traZODone 50 MG tablet Commonly known as: DESYREL Take 50-100 mg by mouth at bedtime.        Allergies:  Allergies  Allergen Reactions   Bee Venom Anaphylaxis   Caffeine Other (See Comments)    Other reaction(s): Other Vertigo vertigo    Ibuprofen     Other reaction(s): Other (See Comments), Other (See Comments) vertigo vertigo    Nsaids Other (See Comments)    Other reaction(s): Other (See Comments) Vertigo and n/v Vertigo and n/v     Family History: Family  History  Problem Relation Age of Onset   Heart disease Father    Congestive Heart Failure Father    COPD Mother    Hypertension Mother     Social History:  reports that he quit smoking about 43 years ago. His smoking use included cigarettes. He has never used smokeless tobacco. He reports that he does not drink alcohol and does not use drugs.  ROS: Pertinent ROS in HPI  Physical Exam: There were no vitals taken for this visit.  Constitutional:  Well nourished. Alert and oriented, No acute distress. HEENT: [place] AT, moist mucus membranes.  Trachea midline, no masses. Cardiovascular: No clubbing, cyanosis, or edema. Respiratory: Normal respiratory effort, no increased work of breathing. GI: Abdomen is soft, non tender, non distended, no abdominal masses. Liver and spleen not palpable.  No hernias appreciated.  Stool sample for occult testing is not indicated.   GU: No CVA tenderness.  No bladder fullness or masses.  Patient with circumcised/uncircumcised phallus. ***Foreskin easily retracted***  Urethral meatus is patent.  No penile discharge. No penile lesions or rashes. Scrotum without lesions, cysts, rashes and/or edema.  Testicles are located scrotally bilaterally. No masses are appreciated in the testicles. Left and right epididymis are normal. Rectal: Patient with  normal sphincter tone. Anus and perineum without scarring or rashes. No rectal masses are appreciated. Prostate is approximately *** grams, *** nodules are appreciated. Seminal vesicles are normal. Skin: No rashes, bruises or suspicious lesions. Lymph: No cervical or inguinal adenopathy. Neurologic: Grossly intact, no focal deficits, moving all 4 extremities. Psychiatric: Normal mood and affect.  Laboratory Data: Serum creatinine (09/2022) 1.1 Total cholesterol (08/2022) 147 UA (08/2022) negative  Urinalysis *** I have reviewed the labs.   Pertinent Imaging: ***   Assessment & Plan:  ***  1. Nocturia -UA  *** -PVR ***  No follow-ups on file.  These notes generated with voice recognition software. I apologize for typographical errors.  [place], [place] [address]  [place], [place] [address] [phone]

## 2023-01-24 ENCOUNTER — Encounter: Payer: Self-pay | Admitting: Urology

## 2023-01-24 ENCOUNTER — Ambulatory Visit (INDEPENDENT_AMBULATORY_CARE_PROVIDER_SITE_OTHER): Payer: Medicare Other | Admitting: Urology

## 2023-01-24 VITALS — BP 139/73 | HR 73 | Ht 71.0 in | Wt 158.0 lb

## 2023-01-24 DIAGNOSIS — R351 Nocturia: Secondary | ICD-10-CM | POA: Diagnosis not present

## 2023-01-24 LAB — URINALYSIS, COMPLETE
Bilirubin, UA: NEGATIVE
Glucose, UA: NEGATIVE
Ketones, UA: NEGATIVE
Leukocytes,UA: NEGATIVE
Nitrite, UA: NEGATIVE
RBC, UA: NEGATIVE
Specific Gravity, UA: 1.015 (ref 1.005–1.030)
Urobilinogen, Ur: 0.2 mg/dL (ref 0.2–1.0)
pH, UA: 6.5 (ref 5.0–7.5)

## 2023-01-24 LAB — MICROSCOPIC EXAMINATION

## 2023-01-24 LAB — BLADDER SCAN AMB NON-IMAGING: Scan Result: 79

## 2023-01-24 MED ORDER — TROSPIUM CHLORIDE 20 MG PO TABS: 20.0000 mg | ORAL_TABLET | Freq: Every day | ORAL | 0 refills | Status: DC

## 2023-01-25 ENCOUNTER — Encounter: Payer: Self-pay | Admitting: Urology

## 2023-01-25 NOTE — Telephone Encounter (Signed)
Please see duplicate message about this that has been sent to [facility]

## 2023-01-30 LAB — CULTURE, URINE COMPREHENSIVE

## 2023-02-19 ENCOUNTER — Ambulatory Visit (HOSPITAL_COMMUNITY): Payer: Medicare Other

## 2023-02-19 ENCOUNTER — Ambulatory Visit: Payer: Medicare Other

## 2023-02-21 ENCOUNTER — Ambulatory Visit (INDEPENDENT_AMBULATORY_CARE_PROVIDER_SITE_OTHER): Payer: Medicare Other | Admitting: Urology

## 2023-02-21 ENCOUNTER — Encounter: Payer: Self-pay | Admitting: Urology

## 2023-02-21 VITALS — BP 121/75 | HR 86 | Ht 71.0 in | Wt 158.0 lb

## 2023-02-21 DIAGNOSIS — N401 Enlarged prostate with lower urinary tract symptoms: Secondary | ICD-10-CM | POA: Diagnosis not present

## 2023-02-21 DIAGNOSIS — R351 Nocturia: Secondary | ICD-10-CM

## 2023-02-21 LAB — URINALYSIS, COMPLETE
Bilirubin, UA: NEGATIVE
Glucose, UA: NEGATIVE
Ketones, UA: NEGATIVE
Leukocytes,UA: NEGATIVE
Nitrite, UA: NEGATIVE
Protein,UA: NEGATIVE
RBC, UA: NEGATIVE
Specific Gravity, UA: 1.015 (ref 1.005–1.030)
Urobilinogen, Ur: 0.2 mg/dL (ref 0.2–1.0)
pH, UA: 7 (ref 5.0–7.5)

## 2023-02-21 LAB — MICROSCOPIC EXAMINATION

## 2023-02-21 NOTE — Progress Notes (Signed)
   02/21/23  CC:  Chief Complaint  Patient presents with   Cysto    HPI: Refer to [facility] note 01/24/2023.  No significant improvement in his nocturia with immediate release trospium at bedtime.  Complains of increased nocturia with hesitancy, weak stream but no bothersome daytime symptoms  Refer to rooming tab for vitals NED. A&Ox3.   No respiratory distress   Abd soft, NT, ND Normal phallus with bilateral descended testicles  Cystoscopy Procedure Note  Patient identification was confirmed, informed consent was obtained, and patient was prepped using Betadine solution.  Lidocaine jelly was administered per urethral meatus.     Pre-Procedure: - Inspection reveals a normal caliber urethral meatus.  Procedure: The flexible cystoscope was introduced without difficulty - No urethral strictures/lesions are present. -Prominent lateral lobe enlargement prostate  - Elevated bladder neck with median lobe - Bilateral ureteral orifices identified - Bladder mucosa  reveals no ulcers, tumors, or lesions - No bladder stones -Moderate trabeculation  Retroflexion shows small intravesical median lobe   Post-Procedure: - Patient tolerated the procedure well  Assessment/ Plan: Marked BPH cystoscopy however his most bothersome symptom is nocturia x 8-9.  He has failed multiple medications including DDAVP, anticholinergic, beta 3 agonists and alpha blockers We discussed that nocturia is one of the symptoms least resistant to any therapies due to a multiple different etiologies Prostate volume calculated on a recent CT angio was 92 cc and his size precludes TURP or UroLift Would not recommend outlet surgery unless there was urodynamic evidence of obstruction Will asked Dr. Matilde Sprang to see for a second opinion   Abbie Sons, MD

## 2023-03-04 NOTE — Progress Notes (Unsigned)
HISTORY AND PHYSICAL     CC:  follow up. Requesting Provider:  Lynnea Ferrier, MD  HPI: This is a 89 y.o. male who is here today for follow up.  He has hx of right 60-79% and 1-39% left ICA stenosis.  He was having syncopal episodes but it was not felt to be related to his carotid stenosis.   Pt was last seen on 10/02/2022.  At that time, he was having some tightness around his ankles bilaterally and equal.  It would happen after walking about 1/4 mile and improved with rest.  It would recur to a lesser degree on continued walking.  He did not have any pain in his calves or thighs or rest pain or tissue loss.  He felt he had sensation of wearing socks when he wasn't wearing any.   He did have a CTA that revealed a focal high grade stenosis in the distal left SFA and multiple mild stenoses throughout the right SFA and most likely not hemodynamically significant.  He had chronic occlusion of bilateral ATA. He was instructed on graduated walking program and on intervention unless he developed disabling claudication, rest pain or tissue loss.   The pt returns today for follow up.    Pt denies any amaurosis fugax, speech difficulties, weakness, numbness, paralysis or clumsiness or facial droop.    Pt denies claudication, rest pain, or non healing wounds.  He states that he still feels like he has socks on his feet even when he doesn't have them on.  He states that this is not changed and not really bothersome.    He takes aspirin 325mg  daily.  He is rehabbing his shoulder after left rotator cuff surgery in December 2023.   He tells me he worked for [employer] and he and other Counsellor helped [employer]   The pt is not on a statin for cholesterol management.    The pt is not on an aspirin.    Other AC:  none The pt is on ARB, CCB, hyrdalazine for hypertension.  The pt is not on medication for diabetes. Tobacco hx:  former  Pt does not have family hx of AAA.    Past Medical History:   Diagnosis Date   BPH (benign prostatic hyperplasia)    Cancer    skin   Hypertension     Past Surgical History:  Procedure Laterality Date   ADENOIDECTOMY     89 yrs old   PARTIAL COLECTOMY     polyps/ appendix removed at same time   ROTATOR CUFF REPAIR     x 2, 2008, 2011   ROTATOR CUFF REPAIR     left and right   SHOULDER ARTHROSCOPY WITH OPEN ROTATOR CUFF REPAIR AND DISTAL CLAVICLE ACROMINECTOMY Left 10/31/2022   Procedure: SHOULDER ARTHROSCOPY WITH OPEN ROTATOR CUFF REPAIR AND DISTAL CLAVICLE ACROMINECTOMY;  Surgeon: Juanell Fairly, MD;  Location: [facility] ORS;  Service: Orthopedics;  Laterality: Left;   TONSILLECTOMY     89 years old    Allergies  Allergen Reactions   Bee Venom Anaphylaxis   Caffeine Other (See Comments)    Other reaction(s): Other Vertigo vertigo    Ibuprofen     Other reaction(s): Other (See Comments), Other (See Comments) vertigo vertigo    Nsaids Other (See Comments)    Other reaction(s): Other (See Comments) Vertigo and n/v Vertigo and n/v     Current Outpatient Medications  Medication Sig Dispense Refill   acetaminophen (TYLENOL) 500 MG tablet  Take 500 mg by mouth every 6 (six) hours as needed.     amLODipine (NORVASC) 10 MG tablet Take 10 mg by mouth at bedtime.  2   azelastine (ASTELIN) 0.1 % nasal spray Place 1-2 sprays into both nostrils in the morning and at bedtime.     cetirizine (ZYRTEC) 10 MG tablet 10 mg daily as needed for allergies.     fluticasone (FLONASE) 50 MCG/ACT nasal spray Place 1 spray into both nostrils 2 (two) times daily.     hydrALAZINE (APRESOLINE) 100 MG tablet Take 100 mg by mouth 2 (two) times daily.     losartan (COZAAR) 100 MG tablet Take 100 mg by mouth daily.  2   silodosin (RAPAFLO) 4 MG CAPS capsule Take 1 capsule (4 mg total) by mouth daily with breakfast. 90 capsule 3   tadalafil (CIALIS) 5 MG tablet Take 1 tablet (5 mg total) by mouth daily. 90 tablet 3   trospium (SANCTURA) 20 MG tablet Take 1  tablet (20 mg total) by mouth at bedtime. 90 tablet 0   No current facility-administered medications for this visit.    Family History  Problem Relation Age of Onset   Heart disease Father    Congestive Heart Failure Father    COPD Mother    Hypertension Mother     Social History   Socioeconomic History   Marital status: Married    Spouse name: Not on file   Number of children: Not on file   Years of education: Not on file   Highest education level: Not on file  Occupational History   Not on file  Tobacco Use   Smoking status: Former    Types: Cigarettes    Quit date: 01/10/1980    Years since quitting: 43.1   Smokeless tobacco: Never  Vaping Use   Vaping Use: Never used  Substance and Sexual Activity   Alcohol use: Never   Drug use: Never   Sexual activity: Yes    Birth control/protection: None  Other Topics Concern   Not on file  Social History Narrative   Not on file   Social Determinants of Health   Financial Resource Strain: Not on file  Food Insecurity: Not on file  Transportation Needs: Not on file  Physical Activity: Not on file  Stress: Not on file  Social Connections: Not on file  Intimate Partner Violence: Not on file     REVIEW OF SYSTEMS:    denotes positive finding,  denotes negative finding Cardiac  Comments:  Chest pain or chest pressure:    Shortness of breath upon exertion:    Short of breath when lying flat:    Irregular heart rhythm:        Vascular    Pain in calf, thigh, or hip brought on by ambulation:    Pain in feet at night that wakes you up from your sleep:     Blood clot in your veins:    Leg swelling:         Pulmonary    Oxygen at home:    Wheezing:         Neurologic    Sudden weakness in arms or legs:     Sudden numbness in arms or legs:     Sudden onset of difficulty speaking or understanding others    Temporary loss of vision in one eye:     Problems with dizziness:         Gastrointestinal  Blood  in stool:     Vomited blood:         Genitourinary    Burning when urinating:     Blood in urine:        Psychiatric    Major depression:         Hematologic    Bleeding problems:    Problems with blood clotting too easily:        Skin    Rashes or ulcers:        Constitutional    Fever or chills:      PHYSICAL EXAMINATION:  Today's Vitals   03/05/23 1411 03/05/23 1418  BP: (!) 155/58 (!) 141/75  Pulse: 65 61  Resp: 18   Temp: 98 F (36.7 C)   TempSrc: Temporal   SpO2: 98%   Weight: 164 lb (74.4 kg)   Height:  (1.778 m)    Body mass index is 23.53 kg/m.   General:  WDWN in NAD; vital signs documented above Gait: Not observed HENT: WNL, normocephalic Pulmonary: normal non-labored breathing  Cardiac: regular HR;  without carotid bruits Abdomen: soft, NT, aortic pulse is not palpable Skin: without rashes Vascular Exam/Pulses:  Right Left  Radial 2+ (normal) 2+ (normal)  DP Unable to palpate Unable to palpate  PT 1+ (weak) 2+ (normal)   Extremities: there are no non healing wounds Musculoskeletal: no muscle wasting or atrophy  Neurologic: A&O X 3;  speech is fluent/normal; moving all extremities equally  Psychiatric:  The pt has Normal affect.   Non-Invasive Vascular Imaging:   ABI's/TBI's on 03/05/2023: Right:  0.83/0.62 - great toe pressure:  90 Left:  0.72/0.63 - great toe pressure:  91  Carotid Duplex on 03/05/2023: Right:  60-79% ICA stenosis Left:  1-39% ICA stenosis  Previous ABI's/TBI's on 10/02/2022: Right:  0.95/0.63 - great toe pressure:  114 Left:  0.81/0.47 - great toe pressure:  86  Previous Carotid duplex on 01/03/2022: Right: 60-79% ICA stenosis Left:   1-39% ICA stenosis    ASSESSMENT/PLAN:: 89 y.o. male here for follow up for PAD and carotid stenosis with hx of right 60-79% and 1-39% left ICA stenosis.   PAD -patient with palpable PT pulses.   -pt does not have rest pain, claudication, non healing wounds. -pt will f/u  in one year with ABI.  He knows to call sooner if he has any wounds on his feet that are slow to heal.   Carotid stenosis -duplex today reveals the right ICA stenosis remains in the 60-79% stenosis category and the left is in the 1-39% range and he remains asymptomatic.  -discussed s/s of stroke with pt and he understands should he develop any of these sx, he will go to the nearest ER or call 911. -pt will f/u in 6 months with carotid duplex.  Discussed that we would not consider intervention unless it reaches greater than 80% stenosis or he has symptoms.    -continue asa.  He does not want to take statin.    Doreatha Massed, [facility] [phone]  Clinic MD:   Myra Gianotti

## 2023-03-05 ENCOUNTER — Ambulatory Visit (HOSPITAL_COMMUNITY)
Admission: RE | Admit: 2023-03-05 | Discharge: 2023-03-05 | Disposition: A | Discharge status: Home / Self Care | Payer: Medicare Other | Source: Ambulatory Visit | Attending: Surgery | Admitting: Surgery

## 2023-03-05 ENCOUNTER — Other Ambulatory Visit: Payer: Self-pay | Admitting: *Deleted

## 2023-03-05 ENCOUNTER — Ambulatory Visit (INDEPENDENT_AMBULATORY_CARE_PROVIDER_SITE_OTHER): Payer: Medicare Other | Admitting: Physician Assistant

## 2023-03-05 ENCOUNTER — Encounter: Payer: Self-pay | Admitting: Physician Assistant

## 2023-03-05 ENCOUNTER — Ambulatory Visit (INDEPENDENT_AMBULATORY_CARE_PROVIDER_SITE_OTHER)
Admission: RE | Admit: 2023-03-05 | Discharge: 2023-03-05 | Disposition: A | Discharge status: Home / Self Care | Payer: Medicare Other | Source: Ambulatory Visit | Attending: Vascular Surgery | Admitting: Vascular Surgery

## 2023-03-05 VITALS — BP 141/75 | HR 61 | Temp 98.0°F | Resp 18 | Ht 70.0 in | Wt 164.0 lb

## 2023-03-05 DIAGNOSIS — I6529 Occlusion and stenosis of unspecified carotid artery: Secondary | ICD-10-CM | POA: Insufficient documentation

## 2023-03-05 DIAGNOSIS — I6523 Occlusion and stenosis of bilateral carotid arteries: Secondary | ICD-10-CM

## 2023-03-05 DIAGNOSIS — I739 Peripheral vascular disease, unspecified: Secondary | ICD-10-CM | POA: Insufficient documentation

## 2023-03-05 LAB — VAS US ABI WITH/WO TBI
Left ABI: 0.72
Right ABI: 0.83

## 2023-03-13 ENCOUNTER — Other Ambulatory Visit: Payer: Self-pay

## 2023-03-13 DIAGNOSIS — I6523 Occlusion and stenosis of bilateral carotid arteries: Secondary | ICD-10-CM

## 2023-03-14 ENCOUNTER — Ambulatory Visit: Payer: Medicare Other | Admitting: Urology

## 2023-03-19 ENCOUNTER — Ambulatory Visit (INDEPENDENT_AMBULATORY_CARE_PROVIDER_SITE_OTHER): Payer: Medicare Other | Admitting: Urology

## 2023-03-19 ENCOUNTER — Encounter: Payer: Self-pay | Admitting: Urology

## 2023-03-19 VITALS — Ht 70.0 in | Wt 164.0 lb

## 2023-03-19 DIAGNOSIS — R351 Nocturia: Secondary | ICD-10-CM

## 2023-03-19 LAB — URINALYSIS, COMPLETE
Bilirubin, UA: NEGATIVE
Glucose, UA: NEGATIVE
Ketones, UA: NEGATIVE
Nitrite, UA: NEGATIVE
Protein,UA: NEGATIVE
Specific Gravity, UA: 1.015 (ref 1.005–1.030)
Urobilinogen, Ur: 0.2 mg/dL (ref 0.2–1.0)
pH, UA: 7 (ref 5.0–7.5)

## 2023-03-19 LAB — MICROSCOPIC EXAMINATION: WBC, UA: >30 /hpf — AB (ref 0–5)

## 2023-03-19 NOTE — Patient Instructions (Signed)

## 2023-03-19 NOTE — Progress Notes (Signed)
03/19/2023 9:58 AM   Brian Little 09-Aug-1934 [id number]  Referring provider: Lynnea Ferrier, MD [address] [facility] [place],  [place] [address]  Chief Complaint  Patient presents with   Follow-up    HPI: ST: Nocturia.  Recent cystoscopy normal other than BPH and elevated bladder neck with median lobe.  He has failed desmopressin anticholinergics and beta 3 agonist and alpha blockers.  Concerned about performing outlet surgery for nocturia.  Patient's primary complaint is the end of 8 or 9 times a night.  Flow is slow and he voids a low volume.  During the day voids every 2-3 hours with a good flow.  Sometimes he gets a little bit or urgency.  He reduce his fluids after dinnertime.  No ankle edema.  No diuretic  No history of kidney stones bladder surgery.  No neurologic issues.     PMH: Past Medical History:  Diagnosis Date   BPH (benign prostatic hyperplasia)    Cancer (HCC)    skin   Hypertension     Surgical History: Past Surgical History:  Procedure Laterality Date   ADENOIDECTOMY     89 yrs old   PARTIAL COLECTOMY     polyps/ appendix removed at same time   ROTATOR CUFF REPAIR     x 2, 2008, 2011   ROTATOR CUFF REPAIR     left and right   SHOULDER ARTHROSCOPY WITH OPEN ROTATOR CUFF REPAIR AND DISTAL CLAVICLE ACROMINECTOMY Left 10/31/2022   Procedure: SHOULDER ARTHROSCOPY WITH OPEN ROTATOR CUFF REPAIR AND DISTAL CLAVICLE ACROMINECTOMY;  Surgeon: Juanell Fairly, MD;  Location: [facility] OR[facility];  Service: Orthopedics;  Laterality: Left;   TONSILLECTOMY     89 years old    Home Medications:  Allergies as of 03/19/2023       Reactions   Bee Venom Anaphylaxis   Caffeine Other (See Comments)   Other reaction(s): Other Vertigo vertigo   Ibuprofen    Other reaction(s): Other (See Comments), Other (See Comments) vertigo vertigo   Nsaids Other (See Comments)   Other reaction(s): Other (See Comments) Vertigo and n/v Vertigo and n/v    Trazodone Other (See Comments)        Medication List        Accurate as of March 19, 2023  9:58 AM. If you have any questions, ask your nurse or doctor.          STOP taking these medications    trospium 20 MG tablet Commonly known as: SANCTURA Stopped by: Martina Sinner, MD       TAKE these medications    acetaminophen 500 MG tablet Commonly known as: TYLENOL Take 500 mg by mouth every 6 (six) hours as needed.   amLODipine 10 MG tablet Commonly known as: NORVASC Take 10 mg by mouth at bedtime.   aspirin EC 325 MG tablet Take 325 mg by mouth daily as needed for mild pain (per patient he takes one (1) tablet prior to PT for shoulder).   azelastine 0.1 % nasal spray Commonly known as: ASTELIN Place 1-2 sprays into both nostrils in the morning and at bedtime.   cetirizine 10 MG tablet Commonly known as: ZYRTEC 10 mg daily as needed for allergies.   fluticasone 50 MCG/ACT nasal spray Commonly known as: FLONASE Place 1 spray into both nostrils 2 (two) times daily.   hydrALAZINE 100 MG tablet Commonly known as: APRESOLINE Take 100 mg by mouth 2 (two) times daily.   losartan  100 MG tablet Commonly known as: COZAAR Take 100 mg by mouth daily.   silodosin 4 MG Caps capsule Commonly known as: RAPAFLO Take 1 capsule (4 mg total) by mouth daily with breakfast.   tadalafil 5 MG tablet Commonly known as: CIALIS Take 1 tablet (5 mg total) by mouth daily.        Allergies:  Allergies  Allergen Reactions   Bee Venom Anaphylaxis   Caffeine Other (See Comments)    Other reaction(s): Other Vertigo vertigo    Ibuprofen     Other reaction(s): Other (See Comments), Other (See Comments) vertigo vertigo    Nsaids Other (See Comments)    Other reaction(s): Other (See Comments) Vertigo and n/v Vertigo and n/v    Trazodone Other (See Comments)    Family History: Family History  Problem Relation Age of Onset   Heart disease Father    Congestive  Heart Failure Father    COPD Mother    Hypertension Mother     Social History:  reports that he quit smoking about 43 years ago. His smoking use included cigarettes. He has never used smokeless tobacco. He reports that he does not drink alcohol and does not use drugs.  ROS:                                        Physical Exam: Ht 5\' 10"  (1.778 m)   Wt 74.4 kg   BMI 23.53 kg/m   Constitutional:  Alert and oriented, No acute distress. HEENT: [place] AT, moist mucus membranes.  Trachea midline, no masses.   Laboratory Data: Lab Results  Component Value Date   WBC 8.4 12/04/2021   HGB 15.4 12/04/2021   HCT 45.7 12/04/2021   MCV 93.1 12/04/2021   PLT 160 12/04/2021    Lab Results  Component Value Date   CREATININE 1.27 (H) 12/04/2021    No results found for: "PSA"  No results found for: "TESTOSTERONE"  No results found for: "HGBA1C"  Urinalysis    Component Value Date/Time   COLORURINE YELLOW 12/04/2021 1136   APPEARANCEUR Clear 02/21/2023 0927   LABSPEC 1.015 12/04/2021 1136   PHURINE 7.5 12/04/2021 1136   GLUCOSEU Negative 02/21/2023 0927   HGBUR NEGATIVE 12/04/2021 1136   BILIRUBINUR Negative 02/21/2023 0927   KETONESUR NEGATIVE 12/04/2021 1136   PROTEINUR Negative 02/21/2023 0927   PROTEINUR NEGATIVE 12/04/2021 1136   NITRITE Negative 02/21/2023 0927   NITRITE NEGATIVE 12/04/2021 1136   LEUKOCYTESUR Negative 02/21/2023 0927   LEUKOCYTESUR NEGATIVE 12/04/2021 1136    Pertinent Imaging:   Assessment & Plan: Patient appears to have small-volume frequency during the night and does reasonably well during the day.  He has had cystoscopy.  Recognizing limitations role of urodynamics discussed.  Percutaneous tibial nerve stimulation discussed and he wants to think about options.  He just had shoulder surgery.  He may choose watchful waiting.  I will otherwise see him as needed.  He understands a prostate surgery generally speaking does not that  good of a treatment for nighttime frequency  1. Nocturia  - Urinalysis, Complete   No follow-ups on file.  Martina Sinner, MD  [facility] [address], [address] [place], [place] [address] [phone]

## 2023-03-23 LAB — CULTURE, URINE COMPREHENSIVE

## 2023-05-25 ENCOUNTER — Other Ambulatory Visit: Payer: Self-pay

## 2023-05-25 ENCOUNTER — Observation Stay
Admission: EM | Admit: 2023-05-25 | Discharge: 2023-05-26 | Disposition: A | Discharge status: Home / Self Care | Payer: Medicare Other | Attending: Internal Medicine | Admitting: Internal Medicine

## 2023-05-25 ENCOUNTER — Encounter: Payer: Self-pay | Admitting: Family Medicine

## 2023-05-25 ENCOUNTER — Observation Stay
Admit: 2023-05-25 | Discharge: 2023-05-25 | Disposition: A | Discharge status: Home / Self Care | Payer: Medicare Other | Attending: Family Medicine | Admitting: Family Medicine

## 2023-05-25 ENCOUNTER — Emergency Department: Payer: Medicare Other

## 2023-05-25 DIAGNOSIS — M6281 Muscle weakness (generalized): Secondary | ICD-10-CM | POA: Diagnosis not present

## 2023-05-25 DIAGNOSIS — R0602 Shortness of breath: Secondary | ICD-10-CM | POA: Diagnosis present

## 2023-05-25 DIAGNOSIS — I509 Heart failure, unspecified: Secondary | ICD-10-CM

## 2023-05-25 DIAGNOSIS — I739 Peripheral vascular disease, unspecified: Secondary | ICD-10-CM | POA: Diagnosis not present

## 2023-05-25 DIAGNOSIS — I5021 Acute systolic (congestive) heart failure: Principal | ICD-10-CM | POA: Insufficient documentation

## 2023-05-25 DIAGNOSIS — N400 Enlarged prostate without lower urinary tract symptoms: Secondary | ICD-10-CM | POA: Diagnosis not present

## 2023-05-25 DIAGNOSIS — Z85828 Personal history of other malignant neoplasm of skin: Secondary | ICD-10-CM | POA: Diagnosis not present

## 2023-05-25 DIAGNOSIS — R7989 Other specified abnormal findings of blood chemistry: Secondary | ICD-10-CM | POA: Diagnosis not present

## 2023-05-25 DIAGNOSIS — I10 Essential (primary) hypertension: Secondary | ICD-10-CM | POA: Diagnosis not present

## 2023-05-25 DIAGNOSIS — Z87891 Personal history of nicotine dependence: Secondary | ICD-10-CM | POA: Diagnosis not present

## 2023-05-25 DIAGNOSIS — I110 Hypertensive heart disease with heart failure: Secondary | ICD-10-CM | POA: Diagnosis not present

## 2023-05-25 DIAGNOSIS — R195 Other fecal abnormalities: Secondary | ICD-10-CM

## 2023-05-25 DIAGNOSIS — Z7982 Long term (current) use of aspirin: Secondary | ICD-10-CM | POA: Diagnosis not present

## 2023-05-25 LAB — BASIC METABOLIC PANEL
Anion gap: 8 (ref 5–15)
Anion gap: 10 (ref 5–15)
BUN: 13 mg/dL (ref 8–23)
BUN: 14 mg/dL (ref 8–23)
CO2: 24 mmol/L (ref 22–32)
CO2: 26 mmol/L (ref 22–32)
Calcium: 8.5 mg/dL — ABNORMAL LOW (ref 8.9–10.3)
Calcium: 9.2 mg/dL (ref 8.9–10.3)
Chloride: 97 mmol/L — ABNORMAL LOW (ref 98–111)
Chloride: 97 mmol/L — ABNORMAL LOW (ref 98–111)
Creatinine, Ser: 0.89 mg/dL (ref 0.61–1.24)
Creatinine, Ser: 1.1 mg/dL (ref 0.61–1.24)
GFR, Estimated: >60 mL/min (ref >60)
GFR, Estimated: >60 mL/min (ref >60)
Glucose, Bld: 94 mg/dL (ref 70–99)
Glucose, Bld: 107 mg/dL — ABNORMAL HIGH (ref 70–99)
Potassium: 3.6 mmol/L (ref 3.5–5.1)
Potassium: 4.3 mmol/L (ref 3.5–5.1)
Sodium: 129 mmol/L — ABNORMAL LOW (ref 135–145)
Sodium: 133 mmol/L — ABNORMAL LOW (ref 135–145)

## 2023-05-25 LAB — TROPONIN I (HIGH SENSITIVITY)
Troponin I (High Sensitivity): 49 ng/L — ABNORMAL HIGH (ref <18)
Troponin I (High Sensitivity): 139 ng/L (ref <18)
Troponin I (High Sensitivity): 690 ng/L (ref <18)
Troponin I (High Sensitivity): 1213 ng/L (ref <18)
Troponin I (High Sensitivity): 1065 ng/L (ref <18)

## 2023-05-25 LAB — CBC WITH DIFFERENTIAL/PLATELET
Abs Immature Granulocytes: 0.03 10*3/uL (ref 0.00–0.07)
Basophils Absolute: 0 10*3/uL (ref 0.0–0.1)
Basophils Relative: 0 %
Eosinophils Absolute: 0.3 10*3/uL (ref 0.0–0.5)
Eosinophils Relative: 3 %
HCT: 39.6 % (ref 39.0–52.0)
Hemoglobin: 13.4 g/dL (ref 13.0–17.0)
Immature Granulocytes: 0 %
Lymphocytes Relative: 8 %
Lymphs Abs: 0.7 10*3/uL (ref 0.7–4.0)
MCH: 28.9 pg (ref 26.0–34.0)
MCHC: 33.8 g/dL (ref 30.0–36.0)
MCV: 85.5 fL (ref 80.0–100.0)
Monocytes Absolute: 0.7 10*3/uL (ref 0.1–1.0)
Monocytes Relative: 7 %
Neutro Abs: 7.9 10*3/uL — ABNORMAL HIGH (ref 1.7–7.7)
Neutrophils Relative %: 82 %
Platelets: 199 10*3/uL (ref 150–400)
RBC: 4.63 MIL/uL (ref 4.22–5.81)
RDW: 14.1 % (ref 11.5–15.5)
WBC: 9.7 10*3/uL (ref 4.0–10.5)
nRBC: 0 % (ref 0.0–0.2)

## 2023-05-25 LAB — LIPID PANEL
Cholesterol: 141 mg/dL (ref 0–200)
HDL: 57 mg/dL (ref >40)
LDL Cholesterol: 70 mg/dL (ref 0–99)
Total CHOL/HDL Ratio: 2.5 RATIO
Triglycerides: 71 mg/dL (ref <150)
VLDL: 14 mg/dL (ref 0–40)

## 2023-05-25 LAB — MAGNESIUM
Magnesium: 2.3 mg/dL (ref 1.7–2.4)
Magnesium: 2.1 mg/dL (ref 1.7–2.4)

## 2023-05-25 LAB — COMPREHENSIVE METABOLIC PANEL
ALT: 9 U/L (ref 0–44)
AST: 14 U/L — ABNORMAL LOW (ref 15–41)
Albumin: 3.7 g/dL (ref 3.5–5.0)
Alkaline Phosphatase: 62 U/L (ref 38–126)
Anion gap: 10 (ref 5–15)
BUN: 14 mg/dL (ref 8–23)
CO2: 21 mmol/L — ABNORMAL LOW (ref 22–32)
Calcium: 8.9 mg/dL (ref 8.9–10.3)
Chloride: 100 mmol/L (ref 98–111)
Creatinine, Ser: 1 mg/dL (ref 0.61–1.24)
GFR, Estimated: >60 mL/min (ref >60)
Glucose, Bld: 108 mg/dL — ABNORMAL HIGH (ref 70–99)
Potassium: 3.3 mmol/L — ABNORMAL LOW (ref 3.5–5.1)
Sodium: 131 mmol/L — ABNORMAL LOW (ref 135–145)
Total Bilirubin: 0.7 mg/dL (ref 0.3–1.2)
Total Protein: 6.7 g/dL (ref 6.5–8.1)

## 2023-05-25 LAB — ECHOCARDIOGRAM COMPLETE
AR max vel: 2.54 cm2
AV Area VTI: 2.65 cm2
AV Area mean vel: 2.34 cm2
AV Mean grad: 4 mmHg
AV Peak grad: 7.3 mmHg
Ao pk vel: 1.35 m/s
Area-P 1/2: 3.76 cm2
Calc EF: 37.8 %
Height: 69 in
MV M vel: 4.23 m/s
MV Peak grad: 71.6 mmHg
MV VTI: 2.58 cm2
P 1/2 time: 403 msec
Radius: 0.55 cm
S' Lateral: 4.9 cm
Single Plane A2C EF: 38.1 %
Single Plane A4C EF: 37.8 %
Weight: 2560 oz

## 2023-05-25 LAB — HEMOGLOBIN A1C
Hgb A1c MFr Bld: 5.2 % (ref 4.8–5.6)
Mean Plasma Glucose: 102.54 mg/dL

## 2023-05-25 LAB — BRAIN NATRIURETIC PEPTIDE: B Natriuretic Peptide: 1100.1 pg/mL — ABNORMAL HIGH (ref 0.0–100.0)

## 2023-05-25 MED ORDER — ASPIRIN 325 MG PO TBEC: 325.0000 mg | DELAYED_RELEASE_TABLET | Freq: Every day | ORAL | Status: DC | PRN

## 2023-05-25 MED ORDER — POTASSIUM CHLORIDE 20 MEQ PO PACK
40.0000 meq | PACK | Freq: Once | ORAL | Status: AC
  Administered 2023-05-25: 40 meq via ORAL
  Filled 2023-05-25: qty 2

## 2023-05-25 MED ORDER — ACETAMINOPHEN 325 MG PO TABS: 650.0000 mg | ORAL_TABLET | ORAL | Status: DC | PRN

## 2023-05-25 MED ORDER — ONDANSETRON HCL 4 MG/2ML IJ SOLN: 4.0000 mg | Freq: Four times a day (QID) | INTRAMUSCULAR | Status: DC | PRN

## 2023-05-25 MED ORDER — SODIUM CHLORIDE 0.9% FLUSH
3.0000 mL | Freq: Two times a day (BID) | INTRAVENOUS | Status: DC
  Administered 2023-05-25 – 2023-05-26 (×3): 3 mL via INTRAVENOUS

## 2023-05-25 MED ORDER — SODIUM CHLORIDE 0.9% FLUSH: 3.0000 mL | INTRAVENOUS | Status: DC | PRN

## 2023-05-25 MED ORDER — SODIUM CHLORIDE 0.9 % IV SOLN: 250.0000 mL | INTRAVENOUS | Status: DC | PRN

## 2023-05-25 MED ORDER — ASPIRIN 81 MG PO TBEC
81.0000 mg | DELAYED_RELEASE_TABLET | Freq: Every day | ORAL | Status: DC
  Administered 2023-05-25 – 2023-05-26 (×2): 81 mg via ORAL
  Filled 2023-05-25 (×2): qty 1

## 2023-05-25 MED ORDER — FUROSEMIDE 40 MG PO TABS: 40.0000 mg | ORAL_TABLET | Freq: Every day | ORAL | Status: DC

## 2023-05-25 MED ORDER — FUROSEMIDE 10 MG/ML IJ SOLN
40.0000 mg | Freq: Once | INTRAMUSCULAR | Status: AC
  Administered 2023-05-25: 40 mg via INTRAVENOUS
  Filled 2023-05-25: qty 4

## 2023-05-25 MED ORDER — ENOXAPARIN SODIUM 40 MG/0.4ML IJ SOSY
40.0000 mg | PREFILLED_SYRINGE | INTRAMUSCULAR | Status: DC
  Administered 2023-05-25 – 2023-05-26 (×2): 40 mg via SUBCUTANEOUS
  Filled 2023-05-25 (×2): qty 0.4

## 2023-05-25 MED ORDER — LOSARTAN POTASSIUM 50 MG PO TABS
100.0000 mg | ORAL_TABLET | Freq: Every day | ORAL | Status: DC
  Administered 2023-05-25 – 2023-05-26 (×2): 100 mg via ORAL
  Filled 2023-05-25 (×2): qty 2

## 2023-05-25 MED ORDER — CALCIUM CARBONATE ANTACID 500 MG PO CHEW
1.0000 | CHEWABLE_TABLET | Freq: Once | ORAL | Status: AC
  Administered 2023-05-25: 200 mg via ORAL
  Filled 2023-05-25: qty 1

## 2023-05-25 MED ORDER — CHOLESTYRAMINE 4 G PO PACK
4.0000 g | PACK | ORAL | Status: DC
  Administered 2023-05-25 – 2023-05-26 (×2): 4 g via ORAL
  Filled 2023-05-25 (×2): qty 1

## 2023-05-25 MED ORDER — FUROSEMIDE 10 MG/ML IJ SOLN
40.0000 mg | Freq: Two times a day (BID) | INTRAMUSCULAR | Status: DC
  Administered 2023-05-25 – 2023-05-26 (×2): 40 mg via INTRAVENOUS
  Filled 2023-05-25 (×2): qty 4

## 2023-05-25 MED ORDER — METOPROLOL SUCCINATE ER 25 MG PO TB24
25.0000 mg | ORAL_TABLET | Freq: Every day | ORAL | Status: DC
  Administered 2023-05-25 – 2023-05-26 (×2): 25 mg via ORAL
  Filled 2023-05-25 (×2): qty 1

## 2023-05-25 NOTE — H&P (Addendum)
History and Physical    Patient: Brian Little NFA:[id number] DOB: 03-25-1934 DOA: 05/25/2023 DOS: the patient was seen and examined on 05/25/2023 PCP: Lynnea Ferrier, MD  Patient coming from: Home  Chief Complaint:  Chief Complaint  Patient presents with   Shortness of Breath   HPI: Brian Little is a 89 y.o. male with medical history significant of BPH, hypertension, bile acid associated diarrhea, claudication, remote history of eosinophilic myocarditis presented with CHF.  Patient reports progressive worsening shortness of breath over the past 2 weeks.  No fevers or chills.  No nausea or vomiting.  Positive orthopnea and PND.  Mild swelling.  Denies any excessive salt intake though does report adding salt to his food.  Uses high-dose aspirin for joint pain.  No abdominal pain.  No nausea or vomiting.  No hemiparesis or confusion.  Feels significantly shortness of breath overnight.  No reported chest pain.  Systolic pressures were in the 180s.  Patient took multiple aspirin.  Non-smoker.  No reported alcohol use.  Follows Dr. Durward Parcel. Presented to the ER afebrile, hemodynamically stable.  White count 9.7, hemoglobin 13.4, platelets 199, creatinine 1, potassium 3.3.  Troponin 49.  EKG with normal sinus rhythm and nonspecific anterior lateral changes.  Chest x-ray with CHF pattern.  Review of Systems: As mentioned in the history of present illness. All other systems reviewed and are negative. Past Medical History:  Diagnosis Date   BPH (benign prostatic hyperplasia)    Cancer (HCC)    skin   Hypertension    Past Surgical History:  Procedure Laterality Date   ADENOIDECTOMY     89 yrs old   PARTIAL COLECTOMY     polyps/ appendix removed at same time   ROTATOR CUFF REPAIR     x 2, 2008, 2011   ROTATOR CUFF REPAIR     left and right   SHOULDER ARTHROSCOPY WITH OPEN ROTATOR CUFF REPAIR AND DISTAL CLAVICLE ACROMINECTOMY Left 10/31/2022   Procedure: SHOULDER ARTHROSCOPY WITH OPEN  ROTATOR CUFF REPAIR AND DISTAL CLAVICLE ACROMINECTOMY;  Surgeon: Juanell Fairly, MD;  Location: [facility] OR[facility];  Service: Orthopedics;  Laterality: Left;   TONSILLECTOMY     89 years old   Social History:  reports that he quit smoking about 43 years ago. His smoking use included cigarettes. He has never used smokeless tobacco. He reports that he does not drink alcohol and does not use drugs.  Allergies  Allergen Reactions   Bee Venom Anaphylaxis   Caffeine Other (See Comments)    Other reaction(s): Other Vertigo vertigo    Ibuprofen     Other reaction(s): Other (See Comments), Other (See Comments) vertigo vertigo    Nsaids Other (See Comments)    Other reaction(s): Other (See Comments) Vertigo and n/v Vertigo and n/v    Trazodone Other (See Comments)    Family History  Problem Relation Age of Onset   Heart disease Father    Congestive Heart Failure Father    COPD Mother    Hypertension Mother     Prior to Admission medications   Medication Sig Start Date End Date Taking? Authorizing Provider  acetaminophen (TYLENOL) 500 MG tablet Take 500 mg by mouth every 6 (six) hours as needed.   Yes [provider]  amLODipine (NORVASC) 10 MG tablet Take 10 mg by mouth at bedtime. 05/06/18  Yes [provider]  aspirin EC 325 MG tablet Take 325 mg by mouth daily as needed for mild pain (per patient he  takes one (1) tablet prior to PT for shoulder).   Yes [provider]  azelastine (ASTELIN) 0.1 % nasal spray Place 1-2 sprays into both nostrils in the morning and at bedtime. 11/10/21  Yes [provider]  cetirizine (ZYRTEC) 10 MG tablet 10 mg daily as needed for allergies. 01/21/13  Yes [provider]  cholestyramine (QUESTRAN) 4 g packet Take 4 g by mouth in the morning. 05/21/23 05/20/24 Yes [provider]  fluticasone (FLONASE) 50 MCG/ACT nasal spray Place 1 spray into both nostrils 2 (two) times daily. 11/10/21  Yes [provider]  hydrALAZINE (APRESOLINE) 100 MG tablet Take 100 mg by mouth 2 (two) times daily. 01/25/22  Yes [provider]  losartan (COZAAR) 100 MG tablet Take 100 mg by mouth daily. 05/06/18  Yes [provider]  silodosin (RAPAFLO) 4 MG CAPS capsule Take 1 capsule (4 mg total) by mouth daily with breakfast. 09/22/22  Yes Stoioff, Verna Czech, MD  tadalafil (CIALIS) 5 MG tablet Take 1 tablet (5 mg total) by mouth daily. 09/22/22  Yes Stoioff, Verna Czech, MD  ARIPiprazole (ABILIFY) 5 MG tablet Take 5 mg by mouth at bedtime as needed. Patient not taking: Reported on 05/25/2023 03/30/23   [provider]  ramelteon (ROZEREM) 8 MG tablet Take 8 mg by mouth at bedtime. Patient not taking: Reported on 05/25/2023    [provider]    Physical Exam: Vitals:   05/25/23 0524 05/25/23 0545 05/25/23 0615 05/25/23 0630  BP:      Pulse:  86  88  Resp:  (!) 22 (!) 21   Temp:      TempSrc:      SpO2:  93%  91%  Weight: 72.6 kg     Height: 5\' 9"  (1.753 m)      Physical Exam Constitutional:      Appearance: He is normal weight.  HENT:     Head: Normocephalic and atraumatic.     Nose: Nose normal.     Mouth/Throat:     Mouth: Mucous membranes are moist.  Eyes:     Pupils: Pupils are equal, round, and reactive to light.  Cardiovascular:     Rate and Rhythm: Normal rate and regular rhythm.  Pulmonary:     Effort: Pulmonary effort is normal.  Abdominal:     General: Bowel sounds are normal.  Musculoskeletal:        General: Normal range of motion.  Skin:    General: Skin is warm.  Neurological:     General: No focal deficit present.  Psychiatric:        Mood and Affect: Mood normal.     Data Reviewed:  There are no new results to review at this time. DG Chest Portable 1 View CLINICAL DATA:  Progressive dyspnea on exertion.  EXAM: PORTABLE CHEST 1 VIEW  COMPARISON:  CT chest 12/08/2019  FINDINGS: Aortic atherosclerotic calcifications. Mild cardiac  enlargement. Trace left pleural effusion. Mild diffuse interstitial edema. No airspace consolidation. Osseous structures appear intact.  IMPRESSION: Mild cardiac enlargement with signs of CHF.  Electronically Signed   By: Signa Kell M.D.   On: 05/25/2023 06:06  Lab Results  Component Value Date   WBC 9.7 05/25/2023   HGB 13.4 05/25/2023   HCT 39.6 05/25/2023   MCV 85.5 05/25/2023   PLT 199 05/25/2023   Last metabolic panel Lab Results  Component Value Date   GLUCOSE 108 (H) 05/25/2023   NA 131 (L) 05/25/2023  K 3.3 (L) 05/25/2023   CL 100 05/25/2023   CO2 21 (L) 05/25/2023   BUN 14 05/25/2023   CREATININE 1.00 05/25/2023   GFRNONAA >60 05/25/2023   CALCIUM 8.9 05/25/2023   PROT 6.7 05/25/2023   ALBUMIN 3.7 05/25/2023   BILITOT 0.7 05/25/2023   ALKPHOS 62 05/25/2023   AST 14 (L) 05/25/2023   ALT 9 05/25/2023   ANIONGAP 10 05/25/2023    Assessment and Plan: * CHF exacerbation (HCC) Positive increased work of breathing, orthopnea PND x 2 weeks with noted BNP of 1100, CHF pattern on chest x-ray IV Lasix in the ER Monitor diuresis 2D echo Strict ins and outs and daily weights Formal consult to [facility] cardiology Follow  Elevated troponin Troponin in the 40s on presentation with shortness of breath in the setting of CHF No active chest pain at present EKG normal sinus rhythm with some nonspecific anterior lateral changes Suspect minimal to mild demand ischemia in the setting of CHF Status post full dose aspirin Trend troponin 2D echo Follow-up cardiology recommendations  Claudication (HCC) Cont ASA    Essential hypertension BP stable Titrate regimen with diuresis   Greater than 50% was spent in counseling and coordination of care with patient Total encounter time 80 minutes or more    Advance Care Planning:   Code Status: Full Code   Consults: Cardiology   Family Communication: Wife at the bedside   Severity of Illness: The appropriate  patient status for this patient is OBSERVATION. Observation status is judged to be reasonable and necessary in order to provide the required intensity of service to ensure the patient's safety. The patient's presenting symptoms, physical exam findings, and initial radiographic and laboratory data in the context of their medical condition is felt to place them at decreased risk for further clinical deterioration. Furthermore, it is anticipated that the patient will be medically stable for discharge from the hospital within 2 midnights of admission.   Author: Floydene Flock, MD 05/25/2023 8:14 AM  For on call review [email or web address].

## 2023-05-25 NOTE — Assessment & Plan Note (Signed)
Cont ASA

## 2023-05-25 NOTE — ED Provider Notes (Signed)
[facility] Provider Note    Event Date/Time   First MD Initiated Contact with Patient 05/25/23 [phone]     (approximate)   History   Shortness of Breath   HPI  Brian Little is a 89 y.o. male who presents to the ED for evaluation of Shortness of Breath   I review a cardiology clinic visit from 7/2.  History of HTN, HLD, lower extremity claudication, carotid stenosis.   Patient presents, alongside his wife, for evaluation of 3 to 4 days of progressive dyspnea on exertion, chest pressure on exertion and orthopnea.  No fevers, cough or syncope.  No chest discomfort or dyspnea at rest, except for orthopnea   Physical Exam   Triage Vital Signs: ED Triage Vitals [05/25/23 0518]  Enc Vitals Group     BP      Pulse      Resp      Temp      Temp src      SpO2 92 %     Weight      Height      Head Circumference      Peak Flow      Pain Score      Pain Loc      Pain Edu?      Excl. in GC?     Most recent vital signs: Vitals:   05/25/23 0615 05/25/23 0630  BP:    Pulse:  88  Resp: (!) 21   Temp:    SpO2:  91%    General: Awake, no distress.  Pleasant and conversational full sentences, sitting upright, looks well CV:  Good peripheral perfusion.  Resp:  Normal effort.  Abd:  No distention.  MSK:  No deformity noted.  Trace bilateral pitting edema Neuro:  No focal deficits appreciated. Other:     ED Results / Procedures / Treatments   Labs (all labs ordered are listed, but only abnormal results are displayed) Labs Reviewed  COMPREHENSIVE METABOLIC PANEL - Abnormal; Notable for the following components:      Result Value   Sodium 131 (*)    Potassium 3.3 (*)    CO2 21 (*)    Glucose, Bld 108 (*)    AST 14 (*)    All other components within normal limits  CBC WITH DIFFERENTIAL/PLATELET - Abnormal; Notable for the following components:   Neutro Abs 7.9 (*)    All other components within normal limits  BRAIN NATRIURETIC PEPTIDE -  Abnormal; Notable for the following components:   B Natriuretic Peptide 1,100.1 (*)    All other components within normal limits  TROPONIN I (HIGH SENSITIVITY) - Abnormal; Notable for the following components:   Troponin I (High Sensitivity) 49 (*)    All other components within normal limits  MAGNESIUM  TROPONIN I (HIGH SENSITIVITY)    EKG Sinus rhythm with frequent PVCs, rate of 87 bpm.  No STEMI, nonspecific ST changes are present  RADIOLOGY 1 view CXR interpreted by me with pulmonary vascular congestion  Official radiology report(s): DG Chest Portable 1 View  Result Date: 05/25/2023 CLINICAL DATA:  Progressive dyspnea on exertion. EXAM: PORTABLE CHEST 1 VIEW COMPARISON:  CT chest 12/08/2019 FINDINGS: Aortic atherosclerotic calcifications. Mild cardiac enlargement. Trace left pleural effusion. Mild diffuse interstitial edema. No airspace consolidation. Osseous structures appear intact. IMPRESSION: Mild cardiac enlargement with signs of CHF. Electronically Signed   By: Signa Kell M.D.   On: 05/25/2023 06:06    PROCEDURES  and INTERVENTIONS:  .1-3 Lead EKG Interpretation  Performed by: Delton Prairie, MD Authorized by: Delton Prairie, MD     Interpretation: normal     ECG rate:  86   ECG rate assessment: normal     Rhythm: sinus rhythm     Ectopy: PVCs     Conduction: normal     Medications  aspirin EC tablet 325 mg (has no administration in time range)  furosemide (LASIX) injection 40 mg (40 mg Intravenous Given 05/25/23 0706)  potassium chloride (KLOR-CON) packet 40 mEq (40 mEq Oral Given 05/25/23 0705)     IMPRESSION / MDM / ASSESSMENT AND PLAN / ED COURSE  I reviewed the triage vital signs and the nursing notes.  Differential diagnosis includes, but is not limited to, ACS, PTX, PNA, muscle strain/spasm, PE, dissection, anxiety, pleural effusion, new onset CHF  {Patient presents with symptoms of an acute illness or injury that is potentially life-threatening.  Pleasant  89 year old presents with evidence of new onset CHF requiring medical admission.  Borderline sats 90-94%.  Trace edema, but not grossly volume overloaded.  Normal CBC.  Mild hyponatremia and hypokalemia.  First troponin is mildly elevated and we will trend this.  No active chest pain and no STEMI.  CXR is congested and BNP is elevated.  Reinitiate diuresis and potassium replacement.  Consult medicine for admission.      FINAL CLINICAL IMPRESSION(S) / ED DIAGNOSES   Final diagnoses:  New onset of congestive heart failure (HCC)     Rx / DC Orders   ED Discharge Orders     None        Note:  This document was prepared using Dragon voice recognition software and may include unintentional dictation errors.   Delton Prairie, MD 05/25/23 [phone]

## 2023-05-25 NOTE — Progress Notes (Signed)
*  PRELIMINARY RESULTS* Echocardiogram 2D Echocardiogram has been performed.  Carolyne Fiscal 05/25/2023, 12:42 PM

## 2023-05-25 NOTE — Consult Note (Addendum)
[facility] CLINIC CARDIOLOGY CONSULT NOTE       Patient ID: Brian Little MRN: [id number] DOB/AGE: Jun 12, 1934 89 y.o.  Admit date: 05/25/2023 Referring Physician Dr. Doree Albee Primary Physician Dr. Graciela Husbands  Primary Cardiologist Dr. Darrold Junker Reason for Consultation CHF  HPI: Brian Little is an [phone] with a PMH of HTN, PAD with claudication, carotid stenosis, HLD, hx eosinophilic myocarditis, BPH with nocturia who presented to [facility] ED 05/25/2023 with several weeks of progressively worsening dyspnea on exertion progressing to orthopnea and PND.  Cardiology is consulted for further assistance.  The patient states over the past several weeks he has had issues with worsening dyspnea on exertion and poor sleep at night as he cannot get comfortable in his bed.  He has had several episodes where he wakes up in the middle of the night feeling as though he cannot breathe, and sitting forward or in a chair allows him to catch his breath and get back to sleep.  As recently as last month he was able to do walk around the mall for exercise, but his shortness of breath is limiting him from doing this.  He has bilateral lower extremity claudication but the more he walks the better his legs actually feel.  Early this morning he woke up gasping for air and had some chest tightness associated with the shortness of breath.  He took his blood pressure which was severely elevated at 190/81.  He called his son who is an OB/GYN who recommended he take 4 baby aspirin and call EMS for further assistance.  At my time evaluation this morning he is sitting upright in the ED stretcher feeling "washed out".  He denies chest pain or shortness of breath, no heart racing or palpitations.  Denies peripheral edema or abdominal distention.  He notes he eats "too much salt" and add salt to most of his foods.  He denies eating many processed foods or salty snacks.    In the emergency department he was given 40 of IV Lasix x 1  and potassium supplementation.  He is afebrile, hypertensive and comfortable on room air.  Labs are notable for sodium 131, potassium 3.3, BUN/creatinine 14/1.00 and GFR >60.  BNP elevated at 1100, high-sensitivity troponin elevated and trending 49, 139.  No leukocytosis with WBCs 9.7, H&H 13.4/39.9.  Chest x-ray with trace left pleural effusion with mild cardiac enlargement and diffuse interstitial edema.  EKG shows sinus rhythm with rate 87 bpm and frequent PVCs with nonspecific lateral T wave abnormality which is overall similar to prior.  Social: Former smoker but quit in the 80s, no alcohol use.  Worked as an Art gallery manager.  Son is an OB/GYN in [place].   Review of systems complete and found to be negative unless listed above     Past Medical History:  Diagnosis Date   BPH (benign prostatic hyperplasia)    Cancer (HCC)    skin   Hypertension     Past Surgical History:  Procedure Laterality Date   ADENOIDECTOMY     89 yrs old   PARTIAL COLECTOMY     polyps/ appendix removed at same time   ROTATOR CUFF REPAIR     x 2, 2008, 2011   ROTATOR CUFF REPAIR     left and right   SHOULDER ARTHROSCOPY WITH OPEN ROTATOR CUFF REPAIR AND DISTAL CLAVICLE ACROMINECTOMY Left 10/31/2022   Procedure: SHOULDER ARTHROSCOPY WITH OPEN ROTATOR CUFF REPAIR AND DISTAL CLAVICLE ACROMINECTOMY;  Surgeon: Juanell Fairly, MD;  Location:  [facility] ORS;  Service: Orthopedics;  Laterality: Left;   TONSILLECTOMY     89 years old    (Not in a hospital admission)  Social History   Socioeconomic History   Marital status: Married    Spouse name: Not on file   Number of children: Not on file   Years of education: Not on file   Highest education level: Not on file  Occupational History   Not on file  Tobacco Use   Smoking status: Former    Types: Cigarettes    Quit date: 01/10/1980    Years since quitting: 43.4   Smokeless tobacco: Never  Vaping Use   Vaping Use: Never used  Substance and Sexual Activity    Alcohol use: Never   Drug use: Never   Sexual activity: Yes    Birth control/protection: None  Other Topics Concern   Not on file  Social History Narrative   Not on file   Social Determinants of Health   Financial Resource Strain: Not on file  Food Insecurity: No Food Insecurity (05/25/2023)   Hunger Vital Sign    Worried About Running Out of Food in the Last Year: Never true    Ran Out of Food in the Last Year: Never true  Transportation Needs: No Transportation Needs (05/25/2023)   PRAPARE - Administrator, Civil Service (Medical): No    Lack of Transportation (Non-Medical): No  Physical Activity: Not on file  Stress: Not on file  Social Connections: Not on file  Intimate Partner Violence: Not At Risk (05/25/2023)   Humiliation, Afraid, Rape, and Kick questionnaire    Fear of Current or Ex-Partner: No    Emotionally Abused: No    Physically Abused: No    Sexually Abused: No    Family History  Problem Relation Age of Onset   Heart disease Father    Congestive Heart Failure Father    COPD Mother    Hypertension Mother      No intake or output data in the 24 hours ending 05/25/23 1020  Vitals:   05/25/23 0524 05/25/23 0545 05/25/23 0615 05/25/23 0630  BP:      Pulse:  86  88  Resp:  (!) 22 (!) 21   Temp:      TempSrc:      SpO2:  93%  91%  Weight: 72.6 kg     Height: 5\' 9"  (1.753 m)       PHYSICAL EXAM General: Pleasant elderly thin Caucasian male, well nourished, in no acute distress.  Sitting upright in ED stretcher. HEENT:  Normocephalic and atraumatic. Neck:  + JVD.  Lungs: Normal respiratory effort on room air.  Bibasilar crackles without appreciable wheezes.   Heart: HRRR with frequent ectopy. Normal S1 and S2 without gallops or murmurs.  Abdomen: Non-distended appearing.  Msk: Normal strength and tone for age. Extremities: Warm and well perfused. No clubbing, cyanosis.  No peripheral edema.  Neuro: Alert and oriented X 3. Psych:  Answers  questions appropriately.   Labs: Basic Metabolic Panel: Recent Labs    05/25/23 0528 05/25/23 0824  NA 131*  --   K 3.3*  --   CL 100  --   CO2 21*  --   GLUCOSE 108*  --   BUN 14  --   CREATININE 1.00  --   CALCIUM 8.9  --   MG 2.3 2.1   Liver Function Tests: Recent Labs    05/25/23 0528  AST  14*  ALT 9  ALKPHOS 62  BILITOT 0.7  PROT 6.7  ALBUMIN 3.7   No results for input(s): "LIPASE", "AMYLASE" in the last 72 hours. CBC: Recent Labs    05/25/23 0528  WBC 9.7  NEUTROABS 7.9*  HGB 13.4  HCT 39.6  MCV 85.5  PLT 199   Cardiac Enzymes: Recent Labs    05/25/23 0528 05/25/23 0824  TROPONINIHS 49* 139*   BNP: Recent Labs    05/25/23 0528  BNP 1,100.1*   D-Dimer: No results for input(s): "DDIMER" in the last 72 hours. Hemoglobin A1C: No results for input(s): "HGBA1C" in the last 72 hours. Fasting Lipid Panel: No results for input(s): "CHOL", "HDL", "LDLCALC", "TRIG", "CHOLHDL", "LDLDIRECT" in the last 72 hours. Thyroid Function Tests: No results for input(s): "TSH", "T4TOTAL", "T3FREE", "THYROIDAB" in the last 72 hours.  Invalid input(s): "FREET3" Anemia Panel: No results for input(s): "VITAMINB12", "FOLATE", "FERRITIN", "TIBC", "IRON", "RETICCTPCT" in the last 72 hours.   Radiology: DG Chest Portable 1 View  Result Date: 05/25/2023 CLINICAL DATA:  Progressive dyspnea on exertion. EXAM: PORTABLE CHEST 1 VIEW COMPARISON:  CT chest 12/08/2019 FINDINGS: Aortic atherosclerotic calcifications. Mild cardiac enlargement. Trace left pleural effusion. Mild diffuse interstitial edema. No airspace consolidation. Osseous structures appear intact. IMPRESSION: Mild cardiac enlargement with signs of CHF. Electronically Signed   By: Signa Kell M.D.   On: 05/25/2023 06:06    ECHO 12/29/2021 DOPPLER ECHO and OTHER SPECIAL PROCEDURES                 Aortic: MILD AR                    No AS                         160.0 cm/sec peak vel      10.2 mmHg peak grad                  Mitral: MILD MR                    No MS                         MV Inflow E Vel = 54.5 cm/sec     MV Annulus E'Vel = 5.1 cm/sec                         E/E'Ratio = 10.7              Tricuspid: MILD TR                    No TS                         248.0 cm/sec peak TR vel   27.6 mmHg peak RV pressure              Pulmonary: MILD PR                    No PS                         111.0 cm/sec peak vel      4.9 mmHg peak grad  _________________________________________________________________________________________  INTERPRETATION  NORMAL LEFT VENTRICULAR SYSTOLIC FUNCTION  NORMAL RIGHT VENTRICULAR SYSTOLIC FUNCTION  NO  VALVULAR STENOSIS  MILD VALVULAR REGURGITATION (See above)  EF 50%  _________________________________________________________________________________________  Electronically signed by         Sena Slate, MD on 12/29/2021 01: 18 PM           Performed By: Merla Riches, RDCS     Ordering Physician: Edison Pace reviewed by me (LT) 05/25/2023 : Sinus rhythm rate 80s PVCs  EKG reviewed by me: NSR 87 PVCs nonspecific lateral TW abnormality, overall similar to prior   Data reviewed by me (LT) 05/25/2023: Last outpatient cardiology note, ED note, admission H&P last 24h vitals tele labs imaging I/O   Principal Problem:   CHF exacerbation (HCC) Active Problems:   Essential hypertension   Claudication (HCC)   Elevated troponin    ASSESSMENT AND PLAN:   Brian Little is an [phone] with a PMH of HTN, PAD with claudication, carotid stenosis, HLD, hx eosinophilic myocarditis, BPH with nocturia who presented to [facility] ED 05/25/2023 with several weeks of progressively worsening dyspnea on exertion progressing to orthopnea and PND.  Cardiology is consulted for further assistance.  # Acute HFpEF Presents with several weeks of progressively worsening dyspnea on exertion, and recently orthopnea and PND.  Unable to walk for exercise due to his dyspnea.  BNP  elevated to 1100 and chest x-ray with vascular congestion and small pleural effusion, clinically euvolemic apart from crackles to auscultation.  Suspect secondary to excess salt intake (add salt to foods). -Continue IV Lasix 40 mg twice daily for now, likely discharge on low-dose Lasix p.o. -Monitor and replenish electrolytes for a goal K >4, mag >2 -Continue losartan 100 mg daily for GDMT -Add metoprolol succinate 25 mg daily -Discussed low-sodium diet -Strict I's/O, daily weights -Echo complete, further recommendations based on its results  # Demand ischemia Troponins borderline elevated and trending 49, 139 with a third repeat pending. Addendum: third troponin resulted at 690, patient remains chest pain free. D/w Dr. Darrold Junker - continue to defer heparin drip in the absence of chest pain, continue to trend trop until peak. Still most c/w demand from volume overload  -Continue aspirin 81 mg daily -Risk factor modification with lipid panel, A1c -Consider functional study / ischemic eval on an outpatient basis  # HTN -Outpatient medications include amlodipine 5 mg daily, losartan 100 mg daily, and hydralazine 100 mg twice daily.  Adjustments as above for GDMT  # PAD with claudication Historically declined statin per his last PCP note.  Will need to discuss this further with the patient.  Lipid panel & A1c as above  This patient's plan of care was discussed and created with Dr. Darrold Junker and he is in agreement.  Signed: Rebeca Allegra , PA-C 05/25/2023, 10:20 AM [facility]

## 2023-05-25 NOTE — ED Triage Notes (Signed)
Pt presents to the ED via EMS c/o SOB worsening over  the last few days. Pt states he has been waking up in the night with night sweats. Pt states he can "get up and go sit in the chair and calm down."

## 2023-05-25 NOTE — Assessment & Plan Note (Signed)
Troponin in the 40s on presentation with shortness of breath in the setting of CHF No active chest pain at present EKG normal sinus rhythm with some nonspecific anterior lateral changes Suspect minimal to mild demand ischemia in the setting of CHF Status post full dose aspirin Trend troponin 2D echo Follow-up cardiology recommendations

## 2023-05-25 NOTE — Assessment & Plan Note (Signed)
BP stable Titrate regimen with diuresis 

## 2023-05-25 NOTE — Assessment & Plan Note (Signed)
Positive increased work of breathing, orthopnea PND x 2 weeks with noted BNP of 1100, CHF pattern on chest x-ray IV Lasix in the ER Monitor diuresis 2D echo Strict ins and outs and daily weights Formal consult to [facility] cardiology Follow

## 2023-05-25 NOTE — Progress Notes (Signed)
Pt arrived to room 252 via wheelchair from the ED. See assessment. Will continue to monitor.

## 2023-05-25 NOTE — Plan of Care (Signed)

## 2023-05-25 NOTE — ED Notes (Signed)
Dr. Alvester Morin aware of elevated troponin result. No new orders at this time.

## 2023-05-26 DIAGNOSIS — I5021 Acute systolic (congestive) heart failure: Secondary | ICD-10-CM | POA: Diagnosis not present

## 2023-05-26 DIAGNOSIS — I5023 Acute on chronic systolic (congestive) heart failure: Secondary | ICD-10-CM

## 2023-05-26 MED ORDER — METOPROLOL SUCCINATE ER 25 MG PO TB24: 25.0000 mg | ORAL_TABLET | Freq: Every day | ORAL | 0 refills | Status: DC

## 2023-05-26 MED ORDER — FUROSEMIDE 40 MG PO TABS: 40.0000 mg | ORAL_TABLET | Freq: Every day | ORAL | 0 refills | Status: DC

## 2023-05-26 MED ORDER — ASPIRIN 81 MG PO TBEC: 81.0000 mg | DELAYED_RELEASE_TABLET | Freq: Every day | ORAL | 12 refills | Status: DC

## 2023-05-26 NOTE — Progress Notes (Signed)
Reviewed all new medications and changes to current medications with spouse and patient. Collected all patient's belongings and returned them to the spouse. Removed IV and telemetry from patient and returned the monitor to the nursing station. Patient transported down to spouses car. Patient Discharged.

## 2023-05-26 NOTE — Discharge Summary (Signed)
Physician Discharge Summary  [name]:[id number] DOB: 1934/10/11 DOA: 05/25/2023  PCP: Lynnea Ferrier, MD  Admit date: 05/25/2023 Discharge date: 05/26/2023  Admitted From: home  Disposition:  home   Recommendations for Outpatient Follow-up:  Follow up with PCP in 1-2 weeks F/u w/ cardio, Dr. Darrold Junker, in 1-2 weeks   Home Health: no  Equipment/Devices:  Discharge Condition: stable  CODE STATUS: full  Diet recommendation: Heart Healthy  Brief/Interim Summary: HPI was taken from Dr. Alvester Morin: Brian Little is a 89 y.o. male with medical history significant of BPH, hypertension, bile acid associated diarrhea, claudication, remote history of eosinophilic myocarditis presented with CHF.  Patient reports progressive worsening shortness of breath over the past 2 weeks.  No fevers or chills.  No nausea or vomiting.  Positive orthopnea and PND.  Mild swelling.  Denies any excessive salt intake though does report adding salt to his food.  Uses high-dose aspirin for joint pain.  No abdominal pain.  No nausea or vomiting.  No hemiparesis or confusion.  Feels significantly shortness of breath overnight.  No reported chest pain.  Systolic pressures were in the 180s.  Patient took multiple aspirin.  Non-smoker.  No reported alcohol use.  Follows Dr. Durward Parcel. Presented to the ER afebrile, hemodynamically stable.  White count 9.7, hemoglobin 13.4, platelets 199, creatinine 1, potassium 3.3.  Troponin 49.  EKG with normal sinus rhythm and nonspecific anterior lateral changes.  Chest x-ray with CHF pattern.    Discharge Diagnoses:  Principal Problem:   CHF exacerbation (HCC) Active Problems:   Essential hypertension   Claudication (HCC)   Elevated troponin Acute systolic CHF exacerbation: echo shows EF 35-40%, normal diastolic function, mod-severe MR, mild-mod AR. Continue on lasix, metoprolol, losartan as per cardio. Monitor I/Os and daily weights. Continue on tele. Cardio following and  recs apprec    Elevated troponins: likely secondary to demand ischemia in setting of CHF exacerbation/volume overload as per cardio. Continue on metoprolol, aspirin, lasix, losartan    Claudication: continue on aspirin    HTN: continue on losartan, metoprolol.    Discharge Instructions  Discharge Instructions     Diet - low sodium heart healthy   Complete by: As directed    Discharge instructions   Complete by: As directed    F/u w/ cardio, Dr. Darrold Junker, in 1-2 weeks. F/u w/ PCP in 1-2 weeks   Increase activity slowly   Complete by: As directed       Allergies as of 05/26/2023       Reactions   Bee Venom Anaphylaxis   Caffeine Other (See Comments)   Other reaction(s): Other Vertigo vertigo   Ibuprofen    Other reaction(s): Other (See Comments), Other (See Comments) vertigo vertigo   Nsaids Other (See Comments)   Other reaction(s): Other (See Comments) Vertigo and n/v Vertigo and n/v   Trazodone Other (See Comments)        Medication List     STOP taking these medications    amLODipine 10 MG tablet Commonly known as: NORVASC   ARIPiprazole 5 MG tablet Commonly known as: ABILIFY   hydrALAZINE 100 MG tablet Commonly known as: APRESOLINE   ramelteon 8 MG tablet Commonly known as: ROZEREM       TAKE these medications    acetaminophen 500 MG tablet Commonly known as: TYLENOL Take 500 mg by mouth every 6 (six) hours as needed.   aspirin EC 81 MG tablet Take 1 tablet (81 mg total) by mouth  daily. Swallow whole. Start taking on: May 27, 2023 What changed:  medication strength how much to take when to take this reasons to take this additional instructions   azelastine 0.1 % nasal spray Commonly known as: ASTELIN Place 1-2 sprays into both nostrils in the morning and at bedtime.   cetirizine 10 MG tablet Commonly known as: ZYRTEC 10 mg daily as needed for allergies.   cholestyramine 4 g packet Commonly known as: QUESTRAN Take 4 g by mouth  in the morning.   fluticasone 50 MCG/ACT nasal spray Commonly known as: FLONASE Place 1 spray into both nostrils 2 (two) times daily.   furosemide 40 MG tablet Commonly known as: Lasix Take 1 tablet (40 mg total) by mouth daily.   losartan 100 MG tablet Commonly known as: COZAAR Take 100 mg by mouth daily.   metoprolol succinate 25 MG 24 hr tablet Commonly known as: TOPROL-XL Take 1 tablet (25 mg total) by mouth daily. Start taking on: May 27, 2023   silodosin 4 MG Caps capsule Commonly known as: RAPAFLO Take 1 capsule (4 mg total) by mouth daily with breakfast.   tadalafil 5 MG tablet Commonly known as: CIALIS Take 1 tablet (5 mg total) by mouth daily.        Follow-up Information     Paraschos, Alexander, MD. Go in 1 week(s).   Specialty: Cardiology Contact information: [address] [facility] [place] [address] [phone]                Allergies  Allergen Reactions   Bee Venom Anaphylaxis   Caffeine Other (See Comments)    Other reaction(s): Other Vertigo vertigo    Ibuprofen     Other reaction(s): Other (See Comments), Other (See Comments) vertigo vertigo    Nsaids Other (See Comments)    Other reaction(s): Other (See Comments) Vertigo and n/v Vertigo and n/v    Trazodone Other (See Comments)    Consultations: cardio   Procedures/Studies: ECHOCARDIOGRAM COMPLETE  Result Date: 05/25/2023    ECHOCARDIOGRAM REPORT   Patient Name:   Brian Little Date of Exam: 05/25/2023 Medical Rec #:  [id number]       Height:       69.0 in Accession #:    [id number]      Weight:       160.0 lb Date of Birth:  Nov 23, 1933       BSA:          1.879 m Patient Age:    88 years        BP:           152/84 mmHg Patient Gender: M               HR:           71 bpm. Exam Location:  [facility] Procedure: 2D Echo, Cardiac Doppler, Color Doppler, 3D Echo and Strain Analysis Indications:     CHF  History:         Patient has no prior history  of Echocardiogram examinations.                  CHF; Risk Factors:Hypertension and Dyslipidemia.  Sonographer:     Mikki Harbor Referring Phys:  [phone] Francoise Schaumann [place] Diagnosing Phys: Marcina Millard MD  Sonographer Comments: Global longitudinal strain was attempted. IMPRESSIONS  1. Left ventricular ejection fraction, by estimation, is 35 to 40%. The left ventricle has moderately decreased function. The left ventricle has  no regional wall motion abnormalities. The left ventricular internal cavity size was moderately dilated. Left ventricular diastolic parameters were normal.  2. Right ventricular systolic function is normal. The right ventricular size is normal. There is moderately elevated pulmonary artery systolic pressure.  3. The mitral valve is normal in structure. Moderate to severe mitral valve regurgitation. No evidence of mitral stenosis.  4. The aortic valve is normal in structure. Aortic valve regurgitation is mild to moderate. No aortic stenosis is present.  5. The inferior vena cava is normal in size with greater than 50% respiratory variability, suggesting right atrial pressure of 3 mmHg. FINDINGS  Left Ventricle: Left ventricular ejection fraction, by estimation, is 35 to 40%. The left ventricle has moderately decreased function. The left ventricle has no regional wall motion abnormalities. The left ventricular internal cavity size was moderately  dilated. There is no left ventricular hypertrophy. Left ventricular diastolic parameters were normal. Right Ventricle: The right ventricular size is normal. No increase in right ventricular wall thickness. Right ventricular systolic function is normal. There is moderately elevated pulmonary artery systolic pressure. The tricuspid regurgitant velocity is 3.05 m/s, and with an assumed right atrial pressure of 15 mmHg, the estimated right ventricular systolic pressure is 52.2 mmHg. Left Atrium: Left atrial size was normal in size. Right Atrium: Right  atrial size was normal in size. Pericardium: There is no evidence of pericardial effusion. Mitral Valve: The mitral valve is normal in structure. Moderate to severe mitral valve regurgitation. No evidence of mitral valve stenosis. MV peak gradient, 4.6 mmHg. The mean mitral valve gradient is 1.0 mmHg. Tricuspid Valve: The tricuspid valve is normal in structure. Tricuspid valve regurgitation is mild . No evidence of tricuspid stenosis. Aortic Valve: The aortic valve is normal in structure. Aortic valve regurgitation is mild to moderate. Aortic regurgitation PHT measures 403 msec. No aortic stenosis is present. Aortic valve mean gradient measures 4.0 mmHg. Aortic valve peak gradient measures 7.3 mmHg. Aortic valve area, by VTI measures 2.65 cm. Pulmonic Valve: The pulmonic valve was normal in structure. Pulmonic valve regurgitation is not visualized. No evidence of pulmonic stenosis. Aorta: The aortic root is normal in size and structure. Venous: The inferior vena cava is normal in size with greater than 50% respiratory variability, suggesting right atrial pressure of 3 mmHg. IAS/Shunts: No atrial level shunt detected by color flow Doppler.  LEFT VENTRICLE PLAX 2D LVIDd:         6.00 cm      Diastology LVIDs:         4.90 cm      LV e' medial:    4.68 cm/s LV PW:         1.00 cm      LV E/e' medial:  22.4 LV IVS:        1.10 cm      LV e' lateral:   4.90 cm/s LVOT diam:     2.00 cm      LV E/e' lateral: 21.4 LV SV:         78 LV SV Index:   42 LVOT Area:     3.14 cm  LV Volumes (MOD) LV vol d, MOD A2C: 144.0 ml LV vol d, MOD A4C: 134.0 ml LV vol s, MOD A2C: 89.1 ml LV vol s, MOD A4C: 83.4 ml LV SV MOD A2C:     54.9 ml LV SV MOD A4C:     134.0 ml LV SV MOD BP:      54.1  ml RIGHT VENTRICLE RV Basal diam:  4.50 cm RV Mid diam:    3.00 cm RV S prime:     6.85 cm/s TAPSE (M-mode): 1.9 cm LEFT ATRIUM             Index        RIGHT ATRIUM           Index LA diam:        5.10 cm 2.71 cm/m   RA Area:     18.70 cm LA Vol  (A2C):   92.8 ml 49.39 ml/m  RA Volume:   59.70 ml  31.77 ml/m LA Vol (A4C):   71.5 ml 38.05 ml/m LA Biplane Vol: 93.9 ml 49.97 ml/m  AORTIC VALVE                    PULMONIC VALVE AV Area (Vmax):    2.54 cm     PV Vmax:       0.83 m/s AV Area (Vmean):   2.34 cm     PV Peak grad:  2.8 mmHg AV Area (VTI):     2.65 cm AV Vmax:           135.00 cm/s AV Vmean:          97.200 cm/s AV VTI:            0.295 m AV Peak Grad:      7.3 mmHg AV Mean Grad:      4.0 mmHg LVOT Vmax:         109.00 cm/s LVOT Vmean:        72.500 cm/s LVOT VTI:          0.249 m LVOT/AV VTI ratio: 0.84 AI PHT:            403 msec  AORTA Ao Root diam: 3.10 cm Ao Asc diam:  3.10 cm MITRAL VALVE                  TRICUSPID VALVE MV Area (PHT): 3.76 cm       TR Peak grad:   37.2 mmHg MV Area VTI:   2.58 cm       TR Vmax:        305.00 cm/s MV Peak grad:  4.6 mmHg MV Mean grad:  1.0 mmHg       SHUNTS MV Vmax:       1.07 m/s       Systemic VTI:  0.25 m MV Vmean:      53.7 cm/s      Systemic Diam: 2.00 cm MV Decel Time: 202 msec MR Peak grad:    71.6 mmHg MR Mean grad:    49.0 mmHg MR Vmax:         423.00 cm/s MR Vmean:        329.0 cm/s MR PISA:         1.90 cm MR PISA Eff ROA: 42 mm MR PISA Radius:  0.55 cm MV E velocity: 105.00 cm/s MV A velocity: 63.90 cm/s MV E/A ratio:  1.64 Marcina Millard MD Electronically signed by Marcina Millard MD Signature Date/Time: 05/25/2023/2:28:49 PM    Final    DG Chest Portable 1 View  Result Date: 05/25/2023 CLINICAL DATA:  Progressive dyspnea on exertion. EXAM: PORTABLE CHEST 1 VIEW COMPARISON:  CT chest 12/08/2019 FINDINGS: Aortic atherosclerotic calcifications. Mild cardiac enlargement. Trace left pleural effusion. Mild diffuse interstitial edema. No airspace consolidation. Osseous structures appear intact. IMPRESSION: Mild  cardiac enlargement with signs of CHF. Electronically Signed   By: Signa Kell M.D.   On: 05/25/2023 06:06   (Echo, Carotid, EGD, Colonoscopy, ERCP)    Subjective: Pt  c/o fatigue    Discharge Exam: Vitals:   05/26/23 0851 05/26/23 1243  BP: (!) 136/103 (!) 137/91  Pulse: (!) 41 64  Resp: 16 16  Temp: 97.6 F (36.4 C) 99.1 F (37.3 C)  SpO2: 94% 97%   Vitals:   05/25/23 2318 05/26/23 0451 05/26/23 0851 05/26/23 1243  BP: 138/79 107/65 (!) 136/103 (!) 137/91  Pulse: 71 95 (!) 41 64  Resp: 16 18 16 16   Temp: 98.2 F (36.8 C) 98.5 F (36.9 C) 97.6 F (36.4 C) 99.1 F (37.3 C)  TempSrc: Oral  Oral Oral  SpO2: 94% 97% 94% 97%  Weight:      Height:        General: Pt is alert, awake, not in acute distress Cardiovascular: S1/S2 +, no rubs, no gallops Respiratory: CTA bilaterally, no wheezing, no rhonchi Abdominal: Soft, NT, ND, bowel sounds + Extremities: no edema, no cyanosis    The results of significant diagnostics from this hospitalization (including imaging, microbiology, ancillary and laboratory) are listed below for reference.     Microbiology: No results found for this or any previous visit (from the past 240 hour(s)).   Labs: BNP (last 3 results) Recent Labs    05/25/23 0528  BNP 1,100.1*   Basic Metabolic Panel: Recent Labs  Lab 05/25/23 0528 05/25/23 0824 05/25/23 1239 05/25/23 1621  NA 131*  --  129* 133*  K 3.3*  --  3.6 4.3  CL 100  --  97* 97*  CO2 21*  --  24 26  GLUCOSE 108*  --  94 107*  BUN 14  --  13 14  CREATININE 1.00  --  0.89 1.10  CALCIUM 8.9  --  8.5* 9.2  MG 2.3 2.1  --   --    Liver Function Tests: Recent Labs  Lab 05/25/23 0528  AST 14*  ALT 9  ALKPHOS 62  BILITOT 0.7  PROT 6.7  ALBUMIN 3.7   No results for input(s): "LIPASE", "AMYLASE" in the last 168 hours. No results for input(s): "AMMONIA" in the last 168 hours. CBC: Recent Labs  Lab 05/25/23 0528  WBC 9.7  NEUTROABS 7.9*  HGB 13.4  HCT 39.6  MCV 85.5  PLT 199   Cardiac Enzymes: No results for input(s): "CKTOTAL", "CKMB", "CKMBINDEX", "TROPONINI" in the last 168 hours. BNP: Invalid input(s): "POCBNP" CBG: No  results for input(s): "GLUCAP" in the last 168 hours. D-Dimer No results for input(s): "DDIMER" in the last 72 hours. Hgb A1c Recent Labs    05/25/23 1621  HGBA1C 5.2   Lipid Profile Recent Labs    05/25/23 0528  CHOL 141  HDL 57  LDLCALC 70  TRIG 71  CHOLHDL 2.5   Thyroid function studies No results for input(s): "TSH", "T4TOTAL", "T3FREE", "THYROIDAB" in the last 72 hours.  Invalid input(s): "FREET3" Anemia work up No results for input(s): "VITAMINB12", "FOLATE", "FERRITIN", "TIBC", "IRON", "RETICCTPCT" in the last 72 hours. Urinalysis    Component Value Date/Time   COLORURINE YELLOW 12/04/2021 1136   APPEARANCEUR Hazy (A) 03/19/2023 0948   LABSPEC 1.015 12/04/2021 1136   PHURINE 7.5 12/04/2021 1136   GLUCOSEU Negative 03/19/2023 0948   HGBUR NEGATIVE 12/04/2021 1136   BILIRUBINUR Negative 03/19/2023 0948   KETONESUR NEGATIVE 12/04/2021 1136   PROTEINUR Negative 03/19/2023 0948  PROTEINUR NEGATIVE 12/04/2021 1136   NITRITE Negative 03/19/2023 0948   NITRITE NEGATIVE 12/04/2021 1136   LEUKOCYTESUR 2+ (A) 03/19/2023 0948   LEUKOCYTESUR NEGATIVE 12/04/2021 1136   Sepsis Labs Recent Labs  Lab 05/25/23 0528  WBC 9.7   Microbiology No results found for this or any previous visit (from the past 240 hour(s)).   Time coordinating discharge: Over 30 minutes  SIGNED:   Charise Killian, MD  [facility] 05/26/2023, 12:50 PM Pager   If 7PM-7AM, please contact night-coverage [email or web address]

## 2023-05-26 NOTE — Evaluation (Signed)
Physical Therapy Evaluation Patient Details Name: Brian Little MRN: [id number] DOB: 1934/10/02 Today's Date: 05/26/2023  History of Present Illness  89 y/o male presented to ED on 05/25/23 for SOB and night sweats. Admitted for CHF exacerbation. PMH: BPH, HTN, bile acid associated diarrhea, CHF  Clinical Impression  Patient admitted with the above. PTA, patient lives with wife and is independent with mobility. Currently, patient functioning at modI level for mobility with no AD. SpO2 maintained 93-94% on RA throughout mobility. Discussed benefit of walking program to improve cardiorespiratory endurance. No further skilled PT needs identified. Would benefit from cardiac rehab at discharge, notified MD. PT will sign off at this time.         Assistance Recommended at Discharge None  If plan is discharge home, recommend the following:  Can travel by private vehicle           Equipment Recommendations None recommended by PT  Recommendations for Other Services       Functional Status Assessment Patient has not had a recent decline in their functional status     Precautions / Restrictions Precautions Precautions: None Restrictions Weight Bearing Restrictions: No      Mobility  Bed Mobility               General bed mobility comments: in bathroom on arrival    Transfers Overall transfer level: Modified independent                      Ambulation/Gait Ambulation/Gait assistance: Modified independent (Device/Increase time) Gait Distance (Feet): 350 Feet Assistive device: None Gait Pattern/deviations: WFL(Within Functional Limits) Gait velocity: decreased     General Gait Details: spO2 93-94% on RA  Stairs            Wheelchair Mobility     Tilt Bed    Modified Rankin (Stroke Patients Only)       Balance Overall balance assessment: No apparent balance deficits (not formally assessed)                                            Pertinent Vitals/Pain Pain Assessment Pain Assessment: No/denies pain    Home Living Family/patient expects to be discharged to:: Private residence Living Arrangements: Spouse/significant other Available Help at Discharge: Family Type of Home: House Home Access: Stairs to enter Entrance Stairs-Rails: Right Entrance Stairs-Number of Steps: 4   Home Layout: One level Home Equipment: None      Prior Function Prior Level of Function : Independent/Modified Independent;Driving                     Hand Dominance        Extremity/Trunk Assessment   Upper Extremity Assessment Upper Extremity Assessment: Generalized weakness    Lower Extremity Assessment Lower Extremity Assessment: Overall WFL for tasks assessed    Cervical / Trunk Assessment Cervical / Trunk Assessment: Normal  Communication   Communication: No difficulties  Cognition Arousal/Alertness: Awake/alert Behavior During Therapy: WFL for tasks assessed/performed Overall Cognitive Status: Within Functional Limits for tasks assessed                                          General Comments      Exercises  Assessment/Plan    PT Assessment Patient does not need any further PT services  PT Problem List         PT Treatment Interventions      PT Goals (Current goals can be found in the Care Plan section)  Acute Rehab PT Goals Patient Stated Goal: to go home PT Goal Formulation: All assessment and education complete, DC therapy    Frequency       Co-evaluation               AM-PAC PT "6 Clicks" Mobility  Outcome Measure Help needed turning from your back to your side while in a flat bed without using bedrails?: None Help needed moving from lying on your back to sitting on the side of a flat bed without using bedrails?: None Help needed moving to and from a bed to a chair (including a wheelchair)?: None Help needed standing up from a chair using your arms  (e.g., wheelchair or bedside chair)?: None Help needed to walk in hospital room?: None Help needed climbing 3-5 steps with a railing? : None 6 Click Score: 24    End of Session   Activity Tolerance: Patient tolerated treatment well Patient left: in chair;with call bell/phone within reach;with nursing/sitter in room;with family/visitor present Nurse Communication: Mobility status PT Visit Diagnosis: Muscle weakness (generalized) (M62.81)    Time: 1610-9604 PT Time Calculation (min) (ACUTE ONLY): 15 min   Charges:   PT Evaluation $PT Eval Low Complexity: 1 Low   PT General Charges $$ ACUTE PT VISIT: 1 Visit         Brian Little, PT, DPT Physical Therapist - [facility]  [id number] [facility]   Brian Little 05/26/2023, 10:34 AM

## 2023-05-26 NOTE — Progress Notes (Signed)
[facility]  SUBJECTIVE: Patient sitting in recliner, denies chest pain or shortness of breath, wishes to go home   Vitals:   05/25/23 1603 05/25/23 1929 05/25/23 2318 05/26/23 0451  BP: 131/82 132/72 138/79 107/65  Pulse: (!) 57 60 71 95  Resp: (!) 21 18 16 18   Temp: 98.2 F (36.8 C) (!) 97.4 F (36.3 C) 98.2 F (36.8 C) 98.5 F (36.9 C)  TempSrc: Oral  Oral   SpO2: 95% 96% 94% 97%  Weight:      Height:         Intake/Output Summary (Last 24 hours) at 05/26/2023 0847 Last data filed at 05/25/2023 1715 Gross per 24 hour  Intake 240 ml  Output --  Net 240 ml      PHYSICAL EXAM  General: Well developed, well nourished, in no acute distress HEENT:  Normocephalic and atramatic Neck:  No JVD.  Lungs: Clear bilaterally to auscultation and percussion. Heart: HRRR . Normal S1 and S2 without gallops or murmurs.  Abdomen: Bowel sounds are positive, abdomen soft and non-tender  Msk:  Back normal, normal gait. Normal strength and tone for age. Extremities: No clubbing, cyanosis or edema.   Neuro: Alert and oriented X 3. Psych:  Good affect, responds appropriately   LABS: Basic Metabolic Panel: Recent Labs    05/25/23 0528 05/25/23 0824 05/25/23 1239 05/25/23 1621  NA 131*  --  129* 133*  K 3.3*  --  3.6 4.3  CL 100  --  97* 97*  CO2 21*  --  24 26  GLUCOSE 108*  --  94 107*  BUN 14  --  13 14  CREATININE 1.00  --  0.89 1.10  CALCIUM 8.9  --  8.5* 9.2  MG 2.3 2.1  --   --    Liver Function Tests: Recent Labs    05/25/23 0528  AST 14*  ALT 9  ALKPHOS 62  BILITOT 0.7  PROT 6.7  ALBUMIN 3.7   No results for input(s): "LIPASE", "AMYLASE" in the last 72 hours. CBC: Recent Labs    05/25/23 0528  WBC 9.7  NEUTROABS 7.9*  HGB 13.4  HCT 39.6  MCV 85.5  PLT 199   Cardiac Enzymes: No results for input(s): "CKTOTAL", "CKMB", "CKMBINDEX", "TROPONINI" in the last 72 hours. BNP: Invalid input(s): "POCBNP" D-Dimer: No results for input(s): "DDIMER" in the  last 72 hours. Hemoglobin A1C: Recent Labs    05/25/23 1621  HGBA1C 5.2   Fasting Lipid Panel: Recent Labs    05/25/23 0528  CHOL 141  HDL 57  LDLCALC 70  TRIG 71  CHOLHDL 2.5   Thyroid Function Tests: No results for input(s): "TSH", "T4TOTAL", "T3FREE", "THYROIDAB" in the last 72 hours.  Invalid input(s): "FREET3" Anemia Panel: No results for input(s): "VITAMINB12", "FOLATE", "FERRITIN", "TIBC", "IRON", "RETICCTPCT" in the last 72 hours.  ECHOCARDIOGRAM COMPLETE  Result Date: 05/25/2023    ECHOCARDIOGRAM REPORT   Patient Name:   Brian Little Date of Exam: 05/25/2023 Medical Rec #:  [id number]       Height:       69.0 in Accession #:    [id number]      Weight:       160.0 lb Date of Birth:  07/27/1934       BSA:          1.879 m Patient Age:    89 years        BP:  152/84 mmHg Patient Gender: M               HR:           71 bpm. Exam Location:  [facility] Procedure: 2D Echo, Cardiac Doppler, Color Doppler, 3D Echo and Strain Analysis Indications:     CHF  History:         Patient has no prior history of Echocardiogram examinations.                  CHF; Risk Factors:Hypertension and Dyslipidemia.  Sonographer:     Mikki Harbor Referring Phys:  [phone] Francoise Schaumann [place] Diagnosing Phys: Marcina Millard MD  Sonographer Comments: Global longitudinal strain was attempted. IMPRESSIONS  1. Left ventricular ejection fraction, by estimation, is 35 to 40%. The left ventricle has moderately decreased function. The left ventricle has no regional wall motion abnormalities. The left ventricular internal cavity size was moderately dilated. Left ventricular diastolic parameters were normal.  2. Right ventricular systolic function is normal. The right ventricular size is normal. There is moderately elevated pulmonary artery systolic pressure.  3. The mitral valve is normal in structure. Moderate to severe mitral valve regurgitation. No evidence of mitral stenosis.  4. The aortic valve is normal in  structure. Aortic valve regurgitation is mild to moderate. No aortic stenosis is present.  5. The inferior vena cava is normal in size with greater than 50% respiratory variability, suggesting right atrial pressure of 3 mmHg. FINDINGS  Left Ventricle: Left ventricular ejection fraction, by estimation, is 35 to 40%. The left ventricle has moderately decreased function. The left ventricle has no regional wall motion abnormalities. The left ventricular internal cavity size was moderately  dilated. There is no left ventricular hypertrophy. Left ventricular diastolic parameters were normal. Right Ventricle: The right ventricular size is normal. No increase in right ventricular wall thickness. Right ventricular systolic function is normal. There is moderately elevated pulmonary artery systolic pressure. The tricuspid regurgitant velocity is 3.05 m/s, and with an assumed right atrial pressure of 15 mmHg, the estimated right ventricular systolic pressure is 52.2 mmHg. Left Atrium: Left atrial size was normal in size. Right Atrium: Right atrial size was normal in size. Pericardium: There is no evidence of pericardial effusion. Mitral Valve: The mitral valve is normal in structure. Moderate to severe mitral valve regurgitation. No evidence of mitral valve stenosis. MV peak gradient, 4.6 mmHg. The mean mitral valve gradient is 1.0 mmHg. Tricuspid Valve: The tricuspid valve is normal in structure. Tricuspid valve regurgitation is mild . No evidence of tricuspid stenosis. Aortic Valve: The aortic valve is normal in structure. Aortic valve regurgitation is mild to moderate. Aortic regurgitation PHT measures 403 msec. No aortic stenosis is present. Aortic valve mean gradient measures 4.0 mmHg. Aortic valve peak gradient measures 7.3 mmHg. Aortic valve area, by VTI measures 2.65 cm. Pulmonic Valve: The pulmonic valve was normal in structure. Pulmonic valve regurgitation is not visualized. No evidence of pulmonic stenosis. Aorta:  The aortic root is normal in size and structure. Venous: The inferior vena cava is normal in size with greater than 50% respiratory variability, suggesting right atrial pressure of 3 mmHg. IAS/Shunts: No atrial level shunt detected by color flow Doppler.  LEFT VENTRICLE PLAX 2D LVIDd:         6.00 cm      Diastology LVIDs:         4.90 cm      LV e' medial:    4.68 cm/s LV PW:  1.00 cm      LV E/e' medial:  22.4 LV IVS:        1.10 cm      LV e' lateral:   4.90 cm/s LVOT diam:     2.00 cm      LV E/e' lateral: 21.4 LV SV:         78 LV SV Index:   42 LVOT Area:     3.14 cm  LV Volumes (MOD) LV vol d, MOD A2C: 144.0 ml LV vol d, MOD A4C: 134.0 ml LV vol s, MOD A2C: 89.1 ml LV vol s, MOD A4C: 83.4 ml LV SV MOD A2C:     54.9 ml LV SV MOD A4C:     134.0 ml LV SV MOD BP:      54.1 ml RIGHT VENTRICLE RV Basal diam:  4.50 cm RV Mid diam:    3.00 cm RV S prime:     6.85 cm/s TAPSE (M-mode): 1.9 cm LEFT ATRIUM             Index        RIGHT ATRIUM           Index LA diam:        5.10 cm 2.71 cm/m   RA Area:     18.70 cm LA Vol (A2C):   92.8 ml 49.39 ml/m  RA Volume:   59.70 ml  31.77 ml/m LA Vol (A4C):   71.5 ml 38.05 ml/m LA Biplane Vol: 93.9 ml 49.97 ml/m  AORTIC VALVE                    PULMONIC VALVE AV Area (Vmax):    2.54 cm     PV Vmax:       0.83 m/s AV Area (Vmean):   2.34 cm     PV Peak grad:  2.8 mmHg AV Area (VTI):     2.65 cm AV Vmax:           135.00 cm/s AV Vmean:          97.200 cm/s AV VTI:            0.295 m AV Peak Grad:      7.3 mmHg AV Mean Grad:      4.0 mmHg LVOT Vmax:         109.00 cm/s LVOT Vmean:        72.500 cm/s LVOT VTI:          0.249 m LVOT/AV VTI ratio: 0.84 AI PHT:            403 msec  AORTA Ao Root diam: 3.10 cm Ao Asc diam:  3.10 cm MITRAL VALVE                  TRICUSPID VALVE MV Area (PHT): 3.76 cm       TR Peak grad:   37.2 mmHg MV Area VTI:   2.58 cm       TR Vmax:        305.00 cm/s MV Peak grad:  4.6 mmHg MV Mean grad:  1.0 mmHg       SHUNTS MV Vmax:       1.07  m/s       Systemic VTI:  0.25 m MV Vmean:      53.7 cm/s      Systemic Diam: 2.00 cm MV Decel Time: 202 msec MR Peak grad:    71.6 mmHg MR  Mean grad:    49.0 mmHg MR Vmax:         423.00 cm/s MR Vmean:        329.0 cm/s MR PISA:         1.90 cm MR PISA Eff ROA: 42 mm MR PISA Radius:  0.55 cm MV E velocity: 105.00 cm/s MV A velocity: 63.90 cm/s MV E/A ratio:  1.64 Marcina Millard MD Electronically signed by Marcina Millard MD Signature Date/Time: 05/25/2023/2:28:49 PM    Final    DG Chest Portable 1 View  Result Date: 05/25/2023 CLINICAL DATA:  Progressive dyspnea on exertion. EXAM: PORTABLE CHEST 1 VIEW COMPARISON:  CT chest 12/08/2019 FINDINGS: Aortic atherosclerotic calcifications. Mild cardiac enlargement. Trace left pleural effusion. Mild diffuse interstitial edema. No airspace consolidation. Osseous structures appear intact. IMPRESSION: Mild cardiac enlargement with signs of CHF. Electronically Signed   By: Signa Kell M.D.   On: 05/25/2023 06:06     Echo LVEF 35-40%, moderate to severe mitral regurgitation  TELEMETRY: Sinus rhythm with frequent PVCs and ventricular bigeminal pattern at 87 bpm:  ASSESSMENT AND PLAN:  Principal Problem:   CHF exacerbation (HCC) Active Problems:   Essential hypertension   Claudication (HCC)   Elevated troponin    1.  Acute on chronic HFrEF, clinically improved after diuresis, 2D echocardiogram 05/25/2023 revealed LVEF 35-40%, on good medical management (metoprolol succinate, losartan, furosemide) 2.  Elevated troponin (49, 139, 690, 1213, 1065), in the absence of chest pain, consistent with demand supply ischemia in the setting of acute on chronic HFrEF 3.  Essential hypertension, blood pressure adequately controlled on metoprolol succinate, losartan and furosemide  Recommendations  1.  Agree with current therapy 2.  Transition IV furosemide to furosemide 40 mg p.o. daily 3.  Continue metoprolol succinate and losartan 4.  After lengthy  discussion with patient, and patient's family, will defer cardiac catheterization at this time, consider outpatient functional study 5.  Ambulate patient, if patient does well, may discharge home, follow-up with me 1 to 2 weeks   Marcina Millard, MD, PhD, [facility] 05/26/2023 8:47 AM

## 2023-05-30 ENCOUNTER — Inpatient Hospital Stay: Payer: Medicare Other

## 2023-05-30 ENCOUNTER — Emergency Department: Payer: Medicare Other

## 2023-05-30 ENCOUNTER — Other Ambulatory Visit: Payer: Self-pay

## 2023-05-30 ENCOUNTER — Inpatient Hospital Stay
Admission: EM | Admit: 2023-05-30 | Discharge: 2023-06-01 | DRG: 291 | Disposition: A | Discharge status: Home / Self Care | Payer: Medicare Other | Attending: Internal Medicine | Admitting: Internal Medicine

## 2023-05-30 DIAGNOSIS — Z825 Family history of asthma and other chronic lower respiratory diseases: Secondary | ICD-10-CM | POA: Diagnosis not present

## 2023-05-30 DIAGNOSIS — Z9102 Food additives allergy status: Secondary | ICD-10-CM | POA: Diagnosis not present

## 2023-05-30 DIAGNOSIS — J9601 Acute respiratory failure with hypoxia: Secondary | ICD-10-CM | POA: Diagnosis present

## 2023-05-30 DIAGNOSIS — I340 Nonrheumatic mitral (valve) insufficiency: Secondary | ICD-10-CM | POA: Diagnosis present

## 2023-05-30 DIAGNOSIS — I493 Ventricular premature depolarization: Secondary | ICD-10-CM | POA: Diagnosis present

## 2023-05-30 DIAGNOSIS — E785 Hyperlipidemia, unspecified: Secondary | ICD-10-CM | POA: Diagnosis present

## 2023-05-30 DIAGNOSIS — I10 Essential (primary) hypertension: Secondary | ICD-10-CM | POA: Diagnosis present

## 2023-05-30 DIAGNOSIS — J918 Pleural effusion in other conditions classified elsewhere: Secondary | ICD-10-CM | POA: Diagnosis present

## 2023-05-30 DIAGNOSIS — Z87891 Personal history of nicotine dependence: Secondary | ICD-10-CM

## 2023-05-30 DIAGNOSIS — I2489 Other forms of acute ischemic heart disease: Secondary | ICD-10-CM | POA: Diagnosis present

## 2023-05-30 DIAGNOSIS — I110 Hypertensive heart disease with heart failure: Principal | ICD-10-CM | POA: Diagnosis present

## 2023-05-30 DIAGNOSIS — D72829 Elevated white blood cell count, unspecified: Secondary | ICD-10-CM | POA: Diagnosis present

## 2023-05-30 DIAGNOSIS — Z9049 Acquired absence of other specified parts of digestive tract: Secondary | ICD-10-CM

## 2023-05-30 DIAGNOSIS — Z79899 Other long term (current) drug therapy: Secondary | ICD-10-CM

## 2023-05-30 DIAGNOSIS — N401 Enlarged prostate with lower urinary tract symptoms: Secondary | ICD-10-CM | POA: Diagnosis present

## 2023-05-30 DIAGNOSIS — Z8249 Family history of ischemic heart disease and other diseases of the circulatory system: Secondary | ICD-10-CM | POA: Diagnosis not present

## 2023-05-30 DIAGNOSIS — Z91030 Bee allergy status: Secondary | ICD-10-CM | POA: Diagnosis not present

## 2023-05-30 DIAGNOSIS — Z85828 Personal history of other malignant neoplasm of skin: Secondary | ICD-10-CM

## 2023-05-30 DIAGNOSIS — Z1152 Encounter for screening for COVID-19: Secondary | ICD-10-CM

## 2023-05-30 DIAGNOSIS — Z886 Allergy status to analgesic agent status: Secondary | ICD-10-CM | POA: Diagnosis not present

## 2023-05-30 DIAGNOSIS — E871 Hypo-osmolality and hyponatremia: Secondary | ICD-10-CM | POA: Diagnosis present

## 2023-05-30 DIAGNOSIS — Z7982 Long term (current) use of aspirin: Secondary | ICD-10-CM

## 2023-05-30 DIAGNOSIS — R7989 Other specified abnormal findings of blood chemistry: Secondary | ICD-10-CM | POA: Diagnosis present

## 2023-05-30 DIAGNOSIS — I5023 Acute on chronic systolic (congestive) heart failure: Secondary | ICD-10-CM

## 2023-05-30 DIAGNOSIS — Z885 Allergy status to narcotic agent status: Secondary | ICD-10-CM

## 2023-05-30 DIAGNOSIS — I739 Peripheral vascular disease, unspecified: Secondary | ICD-10-CM | POA: Diagnosis present

## 2023-05-30 LAB — CBC WITH DIFFERENTIAL/PLATELET
Abs Immature Granulocytes: 0.09 10*3/uL — ABNORMAL HIGH (ref 0.00–0.07)
Basophils Absolute: 0.1 10*3/uL (ref 0.0–0.1)
Basophils Relative: 0 %
Eosinophils Absolute: 0.1 10*3/uL (ref 0.0–0.5)
Eosinophils Relative: 0 %
HCT: 40.3 % (ref 39.0–52.0)
Hemoglobin: 13.7 g/dL (ref 13.0–17.0)
Immature Granulocytes: 1 %
Lymphocytes Relative: 9 %
Lymphs Abs: 1.3 10*3/uL (ref 0.7–4.0)
MCH: 29 pg (ref 26.0–34.0)
MCHC: 34 g/dL (ref 30.0–36.0)
MCV: 85.4 fL (ref 80.0–100.0)
Monocytes Absolute: 1.1 10*3/uL — ABNORMAL HIGH (ref 0.1–1.0)
Monocytes Relative: 7 %
Neutro Abs: 11.9 10*3/uL — ABNORMAL HIGH (ref 1.7–7.7)
Neutrophils Relative %: 83 %
Platelets: 241 10*3/uL (ref 150–400)
RBC: 4.72 MIL/uL (ref 4.22–5.81)
RDW: 13.8 % (ref 11.5–15.5)
WBC: 14.5 10*3/uL — ABNORMAL HIGH (ref 4.0–10.5)
nRBC: 0 % (ref 0.0–0.2)

## 2023-05-30 LAB — BODY FLUID CELL COUNT WITH DIFFERENTIAL
Eos, Fluid: 0 %
Lymphs, Fluid: 10 %
Monocyte-Macrophage-Serous Fluid: 62 %
Neutrophil Count, Fluid: 28 %
Total Nucleated Cell Count, Fluid: 38 cu mm

## 2023-05-30 LAB — TROPONIN I (HIGH SENSITIVITY)
Troponin I (High Sensitivity): 196 ng/L (ref <18)
Troponin I (High Sensitivity): 173 ng/L (ref <18)

## 2023-05-30 LAB — LACTATE DEHYDROGENASE, PLEURAL OR PERITONEAL FLUID: LD, Fluid: 66 U/L — ABNORMAL HIGH (ref 3–23)

## 2023-05-30 LAB — GLUCOSE, PLEURAL OR PERITONEAL FLUID: Glucose, Fluid: 115 mg/dL

## 2023-05-30 LAB — RESP PANEL BY RT-PCR (RSV, FLU A&B, COVID)  RVPGX2
Influenza A by PCR: NEGATIVE
Influenza B by PCR: NEGATIVE
Resp Syncytial Virus by PCR: NEGATIVE
SARS Coronavirus 2 by RT PCR: NEGATIVE

## 2023-05-30 LAB — PROCALCITONIN: Procalcitonin: <0.1 ng/mL

## 2023-05-30 LAB — COMPREHENSIVE METABOLIC PANEL
ALT: 18 U/L (ref 0–44)
AST: 24 U/L (ref 15–41)
Albumin: 4.1 g/dL (ref 3.5–5.0)
Alkaline Phosphatase: 75 U/L (ref 38–126)
Anion gap: 12 (ref 5–15)
BUN: 27 mg/dL — ABNORMAL HIGH (ref 8–23)
CO2: 21 mmol/L — ABNORMAL LOW (ref 22–32)
Calcium: 8.7 mg/dL — ABNORMAL LOW (ref 8.9–10.3)
Chloride: 91 mmol/L — ABNORMAL LOW (ref 98–111)
Creatinine, Ser: 1.16 mg/dL (ref 0.61–1.24)
GFR, Estimated: >60 mL/min (ref >60)
Glucose, Bld: 108 mg/dL — ABNORMAL HIGH (ref 70–99)
Potassium: 4.1 mmol/L (ref 3.5–5.1)
Sodium: 124 mmol/L — ABNORMAL LOW (ref 135–145)
Total Bilirubin: 1.1 mg/dL (ref 0.3–1.2)
Total Protein: 7.2 g/dL (ref 6.5–8.1)

## 2023-05-30 LAB — LACTIC ACID, PLASMA
Lactic Acid, Venous: 1.4 mmol/L (ref 0.5–1.9)
Lactic Acid, Venous: 1.7 mmol/L (ref 0.5–1.9)

## 2023-05-30 LAB — CK: Total CK: 96 U/L (ref 49–397)

## 2023-05-30 LAB — PATHOLOGIST SMEAR REVIEW

## 2023-05-30 LAB — BRAIN NATRIURETIC PEPTIDE: B Natriuretic Peptide: 2575.3 pg/mL — ABNORMAL HIGH (ref 0.0–100.0)

## 2023-05-30 LAB — PROTEIN, PLEURAL OR PERITONEAL FLUID: Total protein, fluid: <3 g/dL

## 2023-05-30 MED ORDER — AZITHROMYCIN 500 MG PO TABS
500.0000 mg | ORAL_TABLET | Freq: Every day | ORAL | Status: DC
  Filled 2023-05-30: qty 1

## 2023-05-30 MED ORDER — ACETAMINOPHEN 325 MG PO TABS
650.0000 mg | ORAL_TABLET | Freq: Four times a day (QID) | ORAL | Status: DC | PRN
  Administered 2023-05-31: 650 mg via ORAL
  Filled 2023-05-30: qty 2

## 2023-05-30 MED ORDER — SODIUM CHLORIDE 0.9 % IV SOLN
500.0000 mg | INTRAVENOUS | Status: DC
  Administered 2023-05-30: 500 mg via INTRAVENOUS
  Filled 2023-05-30: qty 5

## 2023-05-30 MED ORDER — SPIRONOLACTONE 12.5 MG HALF TABLET
12.5000 mg | ORAL_TABLET | Freq: Every day | ORAL | Status: DC
  Administered 2023-05-30 – 2023-06-01 (×3): 12.5 mg via ORAL
  Filled 2023-05-30 (×3): qty 1

## 2023-05-30 MED ORDER — SODIUM CHLORIDE 0.9 % IV SOLN: 2.0000 g | INTRAVENOUS | Status: DC

## 2023-05-30 MED ORDER — LOSARTAN POTASSIUM 50 MG PO TABS
100.0000 mg | ORAL_TABLET | Freq: Every day | ORAL | Status: DC
  Administered 2023-05-30: 100 mg via ORAL
  Filled 2023-05-30: qty 2

## 2023-05-30 MED ORDER — ENOXAPARIN SODIUM 40 MG/0.4ML IJ SOSY
40.0000 mg | PREFILLED_SYRINGE | INTRAMUSCULAR | Status: DC
  Administered 2023-05-30 – 2023-05-31 (×2): 40 mg via SUBCUTANEOUS
  Filled 2023-05-30 (×2): qty 0.4

## 2023-05-30 MED ORDER — LIDOCAINE HCL (PF) 1 % IJ SOLN
10.0000 mL | Freq: Once | INTRAMUSCULAR | Status: AC
  Administered 2023-05-30: 10 mL via SUBCUTANEOUS

## 2023-05-30 MED ORDER — SODIUM CHLORIDE 0.9 % IV SOLN
2.0000 g | INTRAVENOUS | Status: DC
  Administered 2023-05-30: 2 g via INTRAVENOUS
  Filled 2023-05-30: qty 20

## 2023-05-30 MED ORDER — ACETAMINOPHEN 650 MG RE SUPP: 650.0000 mg | Freq: Four times a day (QID) | RECTAL | Status: DC | PRN

## 2023-05-30 MED ORDER — FUROSEMIDE 10 MG/ML IJ SOLN
40.0000 mg | Freq: Two times a day (BID) | INTRAMUSCULAR | Status: DC
  Administered 2023-05-30 – 2023-05-31 (×3): 40 mg via INTRAVENOUS
  Filled 2023-05-30 (×3): qty 4

## 2023-05-30 MED ORDER — NITROGLYCERIN 2 % TD OINT
1.0000 [in_us] | TOPICAL_OINTMENT | Freq: Once | TRANSDERMAL | Status: DC
  Filled 2023-05-30: qty 1

## 2023-05-30 MED ORDER — METOPROLOL SUCCINATE ER 25 MG PO TB24
25.0000 mg | ORAL_TABLET | Freq: Every day | ORAL | Status: DC
  Administered 2023-05-30 – 2023-06-01 (×3): 25 mg via ORAL
  Filled 2023-05-30 (×3): qty 1

## 2023-05-30 MED ORDER — ASPIRIN 81 MG PO TBEC
81.0000 mg | DELAYED_RELEASE_TABLET | Freq: Every day | ORAL | Status: DC
  Administered 2023-05-30 – 2023-06-01 (×3): 81 mg via ORAL
  Filled 2023-05-30 (×3): qty 1

## 2023-05-30 MED ORDER — TAMSULOSIN HCL 0.4 MG PO CAPS
0.4000 mg | ORAL_CAPSULE | Freq: Every day | ORAL | Status: DC
  Administered 2023-05-30 – 2023-06-01 (×2): 0.4 mg via ORAL
  Filled 2023-05-30 (×2): qty 1

## 2023-05-30 MED ORDER — AZITHROMYCIN 500 MG PO TABS: 500.0000 mg | ORAL_TABLET | Freq: Every day | ORAL | Status: DC

## 2023-05-30 MED ORDER — SODIUM CHLORIDE 0.9 % IV SOLN: 500.0000 mg | INTRAVENOUS | Status: DC

## 2023-05-30 MED ORDER — ONDANSETRON HCL 4 MG PO TABS: 4.0000 mg | ORAL_TABLET | Freq: Four times a day (QID) | ORAL | Status: DC | PRN

## 2023-05-30 MED ORDER — ALBUTEROL SULFATE (2.5 MG/3ML) 0.083% IN NEBU: 2.5000 mg | INHALATION_SOLUTION | RESPIRATORY_TRACT | Status: DC | PRN

## 2023-05-30 MED ORDER — FUROSEMIDE 10 MG/ML IJ SOLN
80.0000 mg | Freq: Once | INTRAMUSCULAR | Status: AC
  Administered 2023-05-30: 80 mg via INTRAVENOUS
  Filled 2023-05-30: qty 8

## 2023-05-30 MED ORDER — ONDANSETRON HCL 4 MG/2ML IJ SOLN: 4.0000 mg | Freq: Four times a day (QID) | INTRAMUSCULAR | Status: DC | PRN

## 2023-05-30 MED ORDER — FUROSEMIDE 10 MG/ML IJ SOLN: 40.0000 mg | Freq: Two times a day (BID) | INTRAMUSCULAR | Status: DC

## 2023-05-30 MED ORDER — CHOLESTYRAMINE 4 G PO PACK
4.0000 g | PACK | Freq: Every morning | ORAL | Status: DC
  Administered 2023-05-31 – 2023-06-01 (×2): 4 g via ORAL
  Filled 2023-05-30 (×3): qty 1

## 2023-05-30 NOTE — Progress Notes (Signed)
[facility]  - [place] [facility]   PATIENT NAME: Brian Little    MR#:  [id number]  DATE OF BIRTH:  05/16/1934  SUBJECTIVE:  wife at bedside. Patient came in after increasing shortness of breath at home. Recently discharged over the weekend with new onset CHF. Had some cold sweat. Denies any chest pain or fever.    VITALS:  Blood pressure (!) 127/107, pulse 86, temperature 97.9 F (36.6 C), temperature source Oral, resp. rate 20, height 5\' 9"  (1.753 m), weight 76.2 kg, SpO2 99 %.  PHYSICAL EXAMINATION:   GENERAL:  89 y.o.-year-old patient with no acute distress.  LUNGS: decreased breath sounds bilaterally right more than left, no wheezing CARDIOVASCULAR: S1, S2 normal. No murmur   ABDOMEN: Soft, nontender, nondistended. Bowel sounds present.  EXTREMITIES: No  edema b/l.    NEUROLOGIC: nonfocal  patient is alert and awake SKIN: No obvious rash, lesion, or ulcer.   LABORATORY PANEL:  CBC Recent Labs  Lab 05/30/23 0130  WBC 14.5*  HGB 13.7  HCT 40.3  PLT 241    Chemistries  Recent Labs  Lab 05/25/23 0824 05/25/23 1239 05/30/23 0130  NA  --    < > 124*  K  --    < > 4.1  CL  --    < > 91*  CO2  --    < > 21*  GLUCOSE  --    < > 108*  BUN  --    < > 27*  CREATININE  --    < > 1.16  CALCIUM  --    < > 8.7*  MG 2.1  --   --   AST  --   --  24  ALT  --   --  18  ALKPHOS  --   --  75  BILITOT  --   --  1.1   < > = values in this interval not displayed.   Cardiac Enzymes No results for input(s): "TROPONINI" in the last 168 hours. RADIOLOGY:  US THORACENTESIS ASP PLEURAL SPACE W/IMG GUIDE  Result Date: 05/30/2023 INDICATION: Patient with a history of heart failure presents today with pleural effusions. Interventional radiology asked to perform a diagnostic and therapeutic thoracentesis. EXAM: ULTRASOUND GUIDED THORACENTESIS MEDICATIONS: 1% lidocaine 10 mL COMPLICATIONS: None immediate. PROCEDURE: An ultrasound guided thoracentesis was  thoroughly discussed with the patient and questions answered. The benefits, risks, alternatives and complications were also discussed. The patient understands and wishes to proceed with the procedure. Written consent was obtained. Ultrasound was performed to localize and mark an adequate pocket of fluid in the right chest. The area was then prepped and draped in the normal sterile fashion. 1% Lidocaine was used for local anesthesia. Under ultrasound guidance a 6 Fr Safe-T-Centesis catheter was introduced. Thoracentesis was performed. The catheter was removed and a dressing applied. FINDINGS: A total of approximately 1.2 L of clear yellow fluid was removed. Samples were sent to the laboratory as requested by the clinical team. IMPRESSION: Successful ultrasound guided right thoracentesis yielding 1.2 L of pleural fluid. Procedure performed by Alwyn Ren NP and was supervised by Dr. Juliette Alcide. Electronically Signed   By: Olive Bass M.D.   On: 05/30/2023 11:54   DG Chest Port 1 View  Result Date: 05/30/2023 CLINICAL DATA:  142230 Pleural effusion 142230 [id number] Status post thoracentesis [id number]. EXAM: PORTABLE CHEST 1 VIEW COMPARISON:  05/30/2023. FINDINGS: Near-complete resolution of right pleural effusion with improved aeration of the right lung base.  No pneumothorax. Unchanged moderate pulmonary edema with slightly increased small left pleural effusion and left basilar atelectasis. Similar cardiomegaly and mediastinal contours. IMPRESSION: 1. Near-complete resolution of right pleural effusion with improved aeration of the right lung base. No pneumothorax. 2. Unchanged moderate pulmonary edema with slightly increased small left pleural effusion and left basilar atelectasis. Electronically Signed   By: Orvan Falconer M.D.   On: 05/30/2023 11:09   DG Chest Port 1 View  Result Date: 05/30/2023 CLINICAL DATA:  Shortness of breath EXAM: PORTABLE CHEST 1 VIEW COMPARISON:  Chest x-ray 05/25/2023 FINDINGS:  Cardiomegaly and central pulmonary vascular congestion persist. There is a new small right pleural effusion with right basilar opacities. No pneumothorax or acute fracture. IMPRESSION: 1. Cardiomegaly with central pulmonary vascular congestion. 2. New small right pleural effusion with right basilar atelectasis or infiltrate. Electronically Signed   By: Darliss Cheney M.D.   On: 05/30/2023 02:02    Assessment and Plan   Brian Little is a 89 y.o. male with medical history significant for BPH, hypertension, bile acid associated diarrhea, PAD with claudication, remote history of eosinophilic myocarditis, former smoker hospitalized overnight from 7/5 to 05/26/2023 with CHF exacerbation with demand ischemia (troponin49>139>690) with echo showing EF 35 to 40% and severe MR and mild to moderate AR, seen by cardiology with recommendation for outpatient ischemic evaluation, who returns to the ED with persistent shortness of breath and orthopnea.  Denies lower extremity edema.  He denies chest pain, fever or chills.   Chest x-ray showed cardiomegaly with central pulmonary vascular congestion with a new right pleural effusion with right basilar atelectasis or infiltrate   Acute on chronic systolic CHF (congestive heart failure) (HCC) Elevated troponin Severe mitral regurgitation History of eosinophilic myocarditis --New CHF diagnosis on 7/5 with EF 35 to 40% --IV Lasix, metoprolol and losartan --Daily weights with intake and output monitoring --Cardiology reconsulted --Dr Juliann Pares to see pt  Right Pleural effusion--new --s/p US guided thoracentesis with removal of 1.2 L transudate fluid   Acute respiratory failure with hypoxia (HCC) --Patient had increased work of breathing tachypneic to 25 with O2 sat in the 80s requiring 2 L Secondary to CHF --With leukocytosis and findings on chest x-ray some concern for pneumonia-- clinically does not seem to be pneumonia however will empirically treated for five  days with Zithromax. Pro calcitonin less than .01 -Supplemental oxygen to keep sats over 94%   Leukocytosis --WBC 14,000 and chest x-ray showing new right pleural effusion with right basilar atelectasis or infiltrate -- COVID negative -- clinically does not seem to be pneumonia. Will empirically treated for five days with Zithromax  Essential hypertension Continue losartan and metoprolol           DVT prophylaxis: Lovenox   Consults: Cardiology Dr. Juliann Pares   Advance Care Planning:   Code Status: full   Family Communication: Wife at bedside. Spoke with son Dr Tiburcio Pea   Procedures: right sided US guided Thoracentesis Level of care: Progressive Status is: Inpatient Remains inpatient appropriate because: CHF acute on Chronic CHF    TOTAL TIME TAKING CARE OF THIS PATIENT: 35 minutes.  >50% time spent on counselling and coordination of care  Note: This dictation was prepared with Dragon dictation along with smaller phrase technology. Any transcriptional errors that result from this process are unintentional.  Enedina Finner M.D    [facility]   CC: Primary care physician; Lynnea Ferrier, MD

## 2023-05-30 NOTE — Assessment & Plan Note (Signed)
-  Continue losartan and metoprolol 

## 2023-05-30 NOTE — Assessment & Plan Note (Signed)
Patient had increased work of breathing tachypneic to 25 with O2 sat in the 80s requiring 2 L Secondary to CHF With leukocytosis and findings on chest x-ray some concern for pneumonia Supplemental oxygen to keep sats over 94% Treat etiologies as outlined under respective problems

## 2023-05-30 NOTE — H&P (Signed)
History and Physical    Patient: Brian Little:[id number] DOB: 01-02-1934 DOA: 05/30/2023 DOS: the patient was seen and examined on 05/30/2023 PCP: Lynnea Ferrier, MD  Patient coming from: Home  Chief Complaint:  Chief Complaint  Patient presents with   Shortness of Breath    HPI: Brian Little is a 89 y.o. male with medical history significant for BPH, hypertension, bile acid associated diarrhea, PAD with claudication, remote history of eosinophilic myocarditis, former smoker hospitalized overnight from 7/5 to 05/26/2023 with CHF exacerbation with demand ischemia ([id number]>139>690) with echo showing EF 35 to 40% and severe MR and mild to moderate AR, seen by cardiology with recommendation for outpatient ischemic evaluation, who returns to the ED with persistent shortness of breath and orthopnea.  Denies lower extremity edema.  He denies chest pain, fever or chills.  He has been compliant with the medication. ED course and data review: BP 151/98 with pulse 50 and respirations 25 O2 sats in the 80s requiring 2L O2 sat 95% on room air.  Afebrile. Labs: Troponin 196 with BNP 2575 up from 1100 when he was recently here. WBC 14,000, sodium 124.  Procalcitonin pending EKG, personally viewed and interpreted showing sinus at 96 with a few PVCs with nonspecific ST-T wave changes Chest x-ray showed cardiomegaly with central pulmonary vascular congestion with a new right pleural effusion with right basilar atelectasis or infiltrate Patient treated with IV Lasix 80 mg and also started on Rocephin and azithromycin for possible pneumonia. Hospitalist consulted for admission.   Review of Systems: As mentioned in the history of present illness. All other systems reviewed and are negative.  Past Medical History:  Diagnosis Date   BPH (benign prostatic hyperplasia)    Cancer (HCC)    skin   Hypertension    Past Surgical History:  Procedure Laterality Date   ADENOIDECTOMY     89 yrs old    PARTIAL COLECTOMY     polyps/ appendix removed at same time   ROTATOR CUFF REPAIR     x 2, 2008, 2011   ROTATOR CUFF REPAIR     left and right   SHOULDER ARTHROSCOPY WITH OPEN ROTATOR CUFF REPAIR AND DISTAL CLAVICLE ACROMINECTOMY Left 10/31/2022   Procedure: SHOULDER ARTHROSCOPY WITH OPEN ROTATOR CUFF REPAIR AND DISTAL CLAVICLE ACROMINECTOMY;  Surgeon: Juanell Fairly, MD;  Location: [facility] ORS;  Service: Orthopedics;  Laterality: Left;   TONSILLECTOMY     89 years old   Social History:  reports that he quit smoking about 43 years ago. His smoking use included cigarettes. He has never used smokeless tobacco. He reports that he does not drink alcohol and does not use drugs.  Allergies  Allergen Reactions   Bee Venom Anaphylaxis   Caffeine Other (See Comments)    Other reaction(s): Other Vertigo vertigo    Ibuprofen     Other reaction(s): Other (See Comments), Other (See Comments) vertigo vertigo    Nsaids Other (See Comments)    Other reaction(s): Other (See Comments) Vertigo and n/v Vertigo and n/v    Trazodone Other (See Comments)    Family History  Problem Relation Age of Onset   Heart disease Father    Congestive Heart Failure Father    COPD Mother    Hypertension Mother     Prior to Admission medications   Medication Sig Start Date End Date Taking? Authorizing Provider  acetaminophen (TYLENOL) 500 MG tablet Take 500 mg by mouth every 6 (six) hours as needed.  [provider]  aspirin EC 81 MG tablet Take 1 tablet (81 mg total) by mouth daily. Swallow whole. 05/27/23   Charise Killian, MD  azelastine (ASTELIN) 0.1 % nasal spray Place 1-2 sprays into both nostrils in the morning and at bedtime. 11/10/21   [provider]  cetirizine (ZYRTEC) 10 MG tablet 10 mg daily as needed for allergies. 01/21/13   [provider]  cholestyramine (QUESTRAN) 4 g packet Take 4 g by mouth in the morning. 05/21/23 05/20/24  [provider]  fluticasone  (FLONASE) 50 MCG/ACT nasal spray Place 1 spray into both nostrils 2 (two) times daily. 11/10/21   [provider]  furosemide (LASIX) 40 MG tablet Take 1 tablet (40 mg total) by mouth daily. 05/26/23 06/25/23  Charise Killian, MD  losartan (COZAAR) 100 MG tablet Take 100 mg by mouth daily. 05/06/18   [provider]  metoprolol succinate (TOPROL-XL) 25 MG 24 hr tablet Take 1 tablet (25 mg total) by mouth daily. 05/27/23 06/26/23  Charise Killian, MD  silodosin (RAPAFLO) 4 MG CAPS capsule Take 1 capsule (4 mg total) by mouth daily with breakfast. 09/22/22   Stoioff, Verna Czech, MD  tadalafil (CIALIS) 5 MG tablet Take 1 tablet (5 mg total) by mouth daily. 09/22/22   Riki Altes, MD    Physical Exam: Vitals:   05/30/23 0107 05/30/23 0109  BP: (!) 151/98   Pulse: (!) 50   Resp: (!) 25   Temp: 98 F (36.7 C)   TempSrc: Oral   SpO2: 95%   Weight:  76.2 kg  Height:  5\' 9"  (1.753 m)   Physical Exam Vitals and nursing note reviewed.  Constitutional:      General: He is not in acute distress. HENT:     Head: Normocephalic and atraumatic.  Cardiovascular:     Rate and Rhythm: Normal rate and regular rhythm.     Heart sounds: Normal heart sounds.  Pulmonary:     Effort: Pulmonary effort is normal.     Breath sounds: Wheezing and rales present.  Abdominal:     Palpations: Abdomen is soft.     Tenderness: There is no abdominal tenderness.  Neurological:     Mental Status: Mental status is at baseline.     Labs on Admission: I have personally reviewed following labs and imaging studies  CBC: Recent Labs  Lab 05/25/23 0528 05/30/23 0130  WBC 9.7 14.5*  NEUTROABS 7.9* 11.9*  HGB 13.4 13.7  HCT 39.6 40.3  MCV 85.5 85.4  PLT 199 241   Basic Metabolic Panel: Recent Labs  Lab 05/25/23 0528 05/25/23 0824 05/25/23 1239 05/25/23 1621 05/30/23 0130  NA 131*  --  129* 133* 124*  K 3.3*  --  3.6 4.3 4.1  CL 100  --  97* 97* 91*  CO2 21*  --  24 26 21*  GLUCOSE  108*  --  94 107* 108*  BUN 14  --  13 14 27*  CREATININE 1.00  --  0.89 1.10 1.16  CALCIUM 8.9  --  8.5* 9.2 8.7*  MG 2.3 2.1  --   --   --    GFR: Estimated Creatinine Clearance: 45.7 mL/min (by C-G formula based on SCr of 1.16 mg/dL). Liver Function Tests: Recent Labs  Lab 05/25/23 0528 05/30/23 0130  AST 14* 24  ALT 9 18  ALKPHOS 62 75  BILITOT 0.7 1.1  PROT 6.7 7.2  ALBUMIN 3.7 4.1   No  results for input(s): "LIPASE", "AMYLASE" in the last 168 hours. No results for input(s): "AMMONIA" in the last 168 hours. Coagulation Profile: No results for input(s): "INR", "PROTIME" in the last 168 hours. Cardiac Enzymes: No results for input(s): "CKTOTAL", "CKMB", "CKMBINDEX", "TROPONINI" in the last 168 hours. BNP (last 3 results) No results for input(s): "PROBNP" in the last 8760 hours. HbA1C: No results for input(s): "HGBA1C" in the last 72 hours. CBG: No results for input(s): "GLUCAP" in the last 168 hours. Lipid Profile: No results for input(s): "CHOL", "HDL", "LDLCALC", "TRIG", "CHOLHDL", "LDLDIRECT" in the last 72 hours. Thyroid Function Tests: No results for input(s): "TSH", "T4TOTAL", "FREET4", "T3FREE", "THYROIDAB" in the last 72 hours. Anemia Panel: No results for input(s): "VITAMINB12", "FOLATE", "FERRITIN", "TIBC", "IRON", "RETICCTPCT" in the last 72 hours. Urine analysis:    Component Value Date/Time   COLORURINE YELLOW 12/04/2021 1136   APPEARANCEUR Hazy (A) 03/19/2023 0948   LABSPEC 1.015 12/04/2021 1136   PHURINE 7.5 12/04/2021 1136   GLUCOSEU Negative 03/19/2023 0948   HGBUR NEGATIVE 12/04/2021 1136   BILIRUBINUR Negative 03/19/2023 0948   KETONESUR NEGATIVE 12/04/2021 1136   PROTEINUR Negative 03/19/2023 0948   PROTEINUR NEGATIVE 12/04/2021 1136   NITRITE Negative 03/19/2023 0948   NITRITE NEGATIVE 12/04/2021 1136   LEUKOCYTESUR 2+ (A) 03/19/2023 0948   LEUKOCYTESUR NEGATIVE 12/04/2021 1136    Radiological Exams on Admission: DG Chest Port 1  View  Result Date: 05/30/2023 CLINICAL DATA:  Shortness of breath EXAM: PORTABLE CHEST 1 VIEW COMPARISON:  Chest x-ray 05/25/2023 FINDINGS: Cardiomegaly and central pulmonary vascular congestion persist. There is a new small right pleural effusion with right basilar opacities. No pneumothorax or acute fracture. IMPRESSION: 1. Cardiomegaly with central pulmonary vascular congestion. 2. New small right pleural effusion with right basilar atelectasis or infiltrate. Electronically Signed   By: Darliss Cheney M.D.   On: 05/30/2023 02:02     Data Reviewed: Relevant notes from primary care and specialist visits, past discharge summaries as available in EHR, including Care Everywhere. Prior diagnostic testing as pertinent to current admission diagnoses Updated medications and problem lists for reconciliation ED course, including vitals, labs, imaging, treatment and response to treatment Triage notes, nursing and pharmacy notes and ED provider's notes Notable results as noted in HPI   Assessment and Plan: Acute on chronic systolic CHF (congestive heart failure) (HCC) Elevated troponin Severe mitral regurgitation History of eosinophilic myocarditis New CHF diagnosis on 7/5 with EF 35 to 40% IV Lasix, metoprolol and losartan Daily weights with intake and output monitoring Will get CK and sed rate Cardiology reconsulted   Acute respiratory failure with hypoxia (HCC) Patient had increased work of breathing tachypneic to 25 with O2 sat in the 80s requiring 2 L Secondary to CHF With leukocytosis and findings on chest x-ray some concern for pneumonia Supplemental oxygen to keep sats over 94% Treat etiologies as outlined under respective problems  Leukocytosis WBC 14,000 and chest x-ray showing new right pleural effusion with right basilar atelectasis or infiltrate Follow-up procalcitonin and COVID ordered from the ED Will continue Rocephin and azithromycin started in the ED for possible  pneumonia  Essential hypertension Continue losartan and metoprolol      DVT prophylaxis: Lovenox  Consults: Cardiology Dr. Darrold Junker  Advance Care Planning:   Code Status: Prior   Family Communication: Wife at bedside  Disposition Plan: Back to previous home environment  Severity of Illness: The appropriate patient status for this patient is INPATIENT. Inpatient status is judged to be reasonable and necessary  in order to provide the required intensity of service to ensure the patient's safety. The patient's presenting symptoms, physical exam findings, and initial radiographic and laboratory data in the context of their chronic comorbidities is felt to place them at high risk for further clinical deterioration. Furthermore, it is not anticipated that the patient will be medically stable for discharge from the hospital within 2 midnights of admission.   * I certify that at the point of admission it is my clinical judgment that the patient will require inpatient hospital care spanning beyond 2 midnights from the point of admission due to high intensity of service, high risk for further deterioration and high frequency of surveillance required.*  Author: Andris Baumann, MD 05/30/2023 3:59 AM  For on call review [email or web address].

## 2023-05-30 NOTE — ED Provider Notes (Signed)
[facility] Provider Note    Event Date/Time   First MD Initiated Contact with Patient 05/30/23 0131     (approximate)   History   Shortness of Breath   HPI  Brian Little is a 89 y.o. male with a history of BPH, hypertension, claudication, and recently diagnosed CHF who presents with increased shortness of breath over the last several days since he was discharged from the hospital.  He reports cough mostly productive of clear sputum but has had some specks of blood in the sputum today.  The patient denies any chest pain.  He has no fever or chills.  He states he has been taking Lasix since he was discharged on the sixth but has not had a lot of urine output and has not had any improvement in his shortness of breath.  Denies any associated swelling.  I reviewed the past medical records.  The patient was admitted from 7/5 to 7/6 after presenting with shortness of breath.  He had an echo showing EF of 35 to 40%.  He was started on Lasix, metoprolol, and losartan and seen by cardiology.   Physical Exam   Triage Vital Signs: ED Triage Vitals  Enc Vitals Group     BP 05/30/23 0107 (!) 151/98     Pulse Rate 05/30/23 0107 (!) 50     Resp 05/30/23 0107 (!) 25     Temp 05/30/23 0107 98 F (36.7 C)     Temp Source 05/30/23 0107 Oral     SpO2 05/30/23 0107 95 %     Weight 05/30/23 0109 168 lb (76.2 kg)     Height 05/30/23 0109 5\' 9"  (1.753 m)     Head Circumference --      Peak Flow --      Pain Score 05/30/23 0109 0     Pain Loc --      Pain Edu? --      Excl. in GC? --     Most recent vital signs: Vitals:   05/30/23 0107  BP: (!) 151/98  Pulse: (!) 50  Resp: (!) 25  Temp: 98 F (36.7 C)  SpO2: 95%     General: Awake, no distress.  CV:  Good peripheral perfusion.  Resp:  Increased effort.  Diminished and coarse breath sounds bilaterally with no significant rales. Abd:  No distention.  Other:  No peripheral edema.   ED Results / Procedures  / Treatments   Labs (all labs ordered are listed, but only abnormal results are displayed) Labs Reviewed  BRAIN NATRIURETIC PEPTIDE - Abnormal; Notable for the following components:      Result Value   B Natriuretic Peptide 2,575.3 (*)    All other components within normal limits  CBC WITH DIFFERENTIAL/PLATELET - Abnormal; Notable for the following components:   WBC 14.5 (*)    Neutro Abs 11.9 (*)    Monocytes Absolute 1.1 (*)    Abs Immature Granulocytes 0.09 (*)    All other components within normal limits  COMPREHENSIVE METABOLIC PANEL - Abnormal; Notable for the following components:   Sodium 124 (*)    Chloride 91 (*)    CO2 21 (*)    Glucose, Bld 108 (*)    BUN 27 (*)    Calcium 8.7 (*)    All other components within normal limits  TROPONIN I (HIGH SENSITIVITY) - Abnormal; Notable for the following components:   Troponin I (High Sensitivity) 196 (*)  All other components within normal limits  TROPONIN I (HIGH SENSITIVITY) - Abnormal; Notable for the following components:   Troponin I (High Sensitivity) 173 (*)    All other components within normal limits  RESP PANEL BY RT-PCR (RSV, FLU A&B, COVID)  RVPGX2  CULTURE, BLOOD (ROUTINE X 2)  CULTURE, BLOOD (ROUTINE X 2)  LACTIC ACID, PLASMA  PROCALCITONIN  CK  LACTIC ACID, PLASMA  SEDIMENTATION RATE     EKG  ED ECG REPORT I, Dionne Bucy, the attending physician, personally viewed and interpreted this ECG.  Date: 05/30/2023 EKG Time: 0110 Rate: 96 Rhythm: normal sinus rhythm with PVCs QRS Axis: normal Intervals: normal ST/T Wave abnormalities: Nonspecific ST abnormalities Narrative Interpretation: no evidence of acute ischemia    RADIOLOGY  Chest x-ray: I independently viewed and interpreted the images; there is vascular congestion and a right pleural effusion/opacity increased from x-ray of 7/5   PROCEDURES:  Critical Care performed: Yes, see critical care procedure  note(s)  Procedures   MEDICATIONS ORDERED IN ED: Medications  aspirin EC tablet 81 mg (has no administration in time range)  cholestyramine (QUESTRAN) packet 4 g (has no administration in time range)  losartan (COZAAR) tablet 100 mg (has no administration in time range)  metoprolol succinate (TOPROL-XL) 24 hr tablet 25 mg (has no administration in time range)  tamsulosin (FLOMAX) capsule 0.4 mg (has no administration in time range)  enoxaparin (LOVENOX) injection 40 mg (has no administration in time range)  acetaminophen (TYLENOL) tablet 650 mg (has no administration in time range)    Or  acetaminophen (TYLENOL) suppository 650 mg (has no administration in time range)  ondansetron (ZOFRAN) tablet 4 mg (has no administration in time range)    Or  ondansetron (ZOFRAN) injection 4 mg (has no administration in time range)  cefTRIAXone (ROCEPHIN) 2 g in sodium chloride 0.9 % 100 mL IVPB (has no administration in time range)  azithromycin (ZITHROMAX) 500 mg in sodium chloride 0.9 % 250 mL IVPB (has no administration in time range)  furosemide (LASIX) injection 40 mg (has no administration in time range)  albuterol (PROVENTIL) (2.5 MG/3ML) 0.083% nebulizer solution 2.5 mg (has no administration in time range)  furosemide (LASIX) injection 80 mg (80 mg Intravenous Given 05/30/23 0220)     IMPRESSION / MDM / ASSESSMENT AND PLAN / ED COURSE  I reviewed the triage vital signs and the nursing notes.  89 year old male with PMH as noted above presents with worsening shortness of breath and productive cough with trace hemoptysis since he was discharged from the hospital 5 days ago for new onset CHF.  He has been taking Lasix with no improvement.  On exam the patient demonstrates increased work of breathing although no acute respiratory distress and is speaking in full sentences.  She is hypertensive.  O2 saturation was in the 80s on room air so he was put on 2 to 4 L by nasal cannula.  Differential  diagnosis includes, but is not limited to, persistent CHF, pneumonia, acute bronchitis, viral syndrome, ACS.  I have ordered IV Lasix, nitro paste, and we will obtain chest x-ray, lab workup, and reassess.  Patient's presentation is most consistent with acute presentation with potential threat to life or bodily function.  The patient is on the cardiac monitor to evaluate for evidence of arrhythmia and/or significant heart rate changes.   ----------------------------------------- 4:00 AM on 05/30/2023 -----------------------------------------   Chest x-ray showed vascular congestion but also a right lower lobe effusion and/or opacity.  WBC count  is also elevated.  Therefore there may be a component of pneumonia.  I have ordered antibiotics for CAP.  I have also ordered a procalcitonin to help elucidate this finding.  BNP is significantly elevated.  Troponin is elevated but decreased from the patient's admission last week.  Overall I suspect CHF is the primary etiology of the patient's symptoms.  He appears much more comfortable than he did earlier and his work of breathing has decreased.  He will need admission for further workup and treatment.  I consulted Dr. Para March from the hospitalist service; based our discussion she agrees to evaluate the patient for admission.   FINAL CLINICAL IMPRESSION(S) / ED DIAGNOSES   Final diagnoses:  Acute respiratory failure with hypoxia (HCC)     Rx / DC Orders   ED Discharge Orders     None        Note:  This document was prepared using Dragon voice recognition software and may include unintentional dictation errors.    Dionne Bucy, MD 05/30/23 [phone]

## 2023-05-30 NOTE — Assessment & Plan Note (Addendum)
WBC 14,000 and chest x-ray showing new right pleural effusion with right basilar atelectasis or infiltrate Follow-up procalcitonin and COVID ordered from the ED Will continue Rocephin and azithromycin started in the ED for possible pneumonia

## 2023-05-30 NOTE — Assessment & Plan Note (Signed)
Elevated troponin Severe mitral regurgitation History of eosinophilic myocarditis New CHF diagnosis on 7/5 with EF 35 to 40% IV Lasix, metoprolol and losartan Daily weights with intake and output monitoring Will get CK and sed rate Cardiology reconsulted

## 2023-05-30 NOTE — Procedures (Signed)
PROCEDURE SUMMARY:  Successful US guided right thoracentesis. Yielded 1.2 L of clear yellow fluid. Pt tolerated procedure well. No immediate complications.  Specimen sent for labs. CXR ordered; pending.  EBL < 2 mL  Mickie Kay, NP 05/30/2023 11:01 AM

## 2023-05-30 NOTE — Consult Note (Signed)
[facility] CARDIOLOGY CONSULT NOTE       Patient ID: Brian Little MRN: [id number] DOB/AGE: 1934-01-11 89 y.o.  Admit date: 05/30/2023 Referring Physician Dr. Lindajo Royal Primary Physician Dr. Graciela Husbands Primary Cardiologist Dr. Darrold Junker Reason for Consultation acute on chronic HFrEF  HPI: Ichael "Nadine Counts" Edwin Little is an 56yoM with a PMH of new HFrEF (EF 35-40% 05/25/2023), HTN, PAD with claudication, carotid stenosis, HLD, hx eosinophilic myocarditis, BPH with nocturia with recent admission from 7/5 to 7/6 for acute HFrEF  presented to [facility] ED early morning hours of 05/30/2023 with orthopnea and PND, worsening since hospital discharge.  Cardiology is consulted for further assistance.  The patient is well-known to our service from admission last week where echo revealed newly reduced EF of 35-40%.  He was diuresed aggressively with IV Lasix with clinical improvement, was discharged home on Lasix 40 mg p.o. daily, metoprolol succinate, and losartan with plan for outpatient functional study. Since hospital discharge over the past 4 days he has had increased weight gain of 5 pounds from baseline on his home scales, with current weight 168 pounds in the emergency department this morning.  He reports continued orthopnea which progressed to PND with "sweats" early this morning, prompting his presentation.  He has been compliant with his p.o. Lasix at discharge but felt as though his urine output was not as brisk at home.  He and his wife have made changes to his diet by not adding salt to foods which she was doing previously.  He feels like his stomach is bloated and has had a dry cough but denies fever or chills at home.  He denies chest pain, heart racing or palpitations, lightheadedness or dizziness, or lower extremity edema.   Vitals notable for BP 147/79, HR in the 80s in NSR with frequent PVCs occasionally in a pattern of trigeminy on tele. SPOT 97% on 2L [place]. Labs notable for hyponatremia to 124,  BUN/Cr 27/1.16 and GFR >60. BNP elevated to 2575, troponin elevated and flat trending at 196, 173. CXR with moderate to large R pleural effusion, pending thoracentesis this AM.   Review of systems complete and found to be negative unless listed above     Past Medical History:  Diagnosis Date   BPH (benign prostatic hyperplasia)    Cancer (HCC)    skin   Hypertension     Past Surgical History:  Procedure Laterality Date   ADENOIDECTOMY     89 yrs old   PARTIAL COLECTOMY     polyps/ appendix removed at same time   ROTATOR CUFF REPAIR     x 2, 2008, 2011   ROTATOR CUFF REPAIR     left and right   SHOULDER ARTHROSCOPY WITH OPEN ROTATOR CUFF REPAIR AND DISTAL CLAVICLE ACROMINECTOMY Left 10/31/2022   Procedure: SHOULDER ARTHROSCOPY WITH OPEN ROTATOR CUFF REPAIR AND DISTAL CLAVICLE ACROMINECTOMY;  Surgeon: Juanell Fairly, MD;  Location: [facility] ORS;  Service: Orthopedics;  Laterality: Left;   TONSILLECTOMY     89 years old    (Not in a hospital admission)  Social History   Socioeconomic History   Marital status: Married    Spouse name: Not on file   Number of children: Not on file   Years of education: Not on file   Highest education level: Not on file  Occupational History   Not on file  Tobacco Use   Smoking status: Former    Types: Cigarettes    Quit date: 01/10/1980    Years  since quitting: 43.4   Smokeless tobacco: Never  Vaping Use   Vaping Use: Never used  Substance and Sexual Activity   Alcohol use: Never   Drug use: Never   Sexual activity: Yes    Birth control/protection: None  Other Topics Concern   Not on file  Social History Narrative   Not on file   Social Determinants of Health   Financial Resource Strain: Not on file  Food Insecurity: No Food Insecurity (05/30/2023)   Hunger Vital Sign    Worried About Running Out of Food in the Last Year: Never true    Ran Out of Food in the Last Year: Never true  Transportation Needs: No Transportation Needs  (05/30/2023)   PRAPARE - Administrator, Civil Service (Medical): No    Lack of Transportation (Non-Medical): No  Physical Activity: Not on file  Stress: Not on file  Social Connections: Not on file  Intimate Partner Violence: Not At Risk (05/30/2023)   Humiliation, Afraid, Rape, and Kick questionnaire    Fear of Current or Ex-Partner: No    Emotionally Abused: No    Physically Abused: No    Sexually Abused: No    Family History  Problem Relation Age of Onset   Heart disease Father    Congestive Heart Failure Father    COPD Mother    Hypertension Mother      No intake or output data in the 24 hours ending 05/30/23 0926  Vitals:   05/30/23 0107 05/30/23 0109 05/30/23 0700 05/30/23 0754  BP: (!) 151/98  100/87 (!) 147/79  Pulse: (!) 50  84 81  Resp: (!) 25  20 20   Temp: 98 F (36.7 C)   97.9 F (36.6 C)  TempSrc: Oral   Oral  SpO2: 95%  100% 97%  Weight:  76.2 kg    Height:  5\' 9"  (1.753 m)      PHYSICAL EXAM General: pleasant thin elderly caucasian male , well nourished, in no acute distress. Sitting up in bed with wife at bedside HEENT:  Normocephalic and atraumatic. Neck:  No JVD.  Lungs: Normal respiratory effort on 2L [place]. Absent breath sounds R base, crackles on L base Heart: HRRR . Normal S1 and S2 without gallops or murmurs.  Abdomen: mildly distended appearing.  Msk: Normal strength and tone for age. Extremities: Warm and well perfused. No clubbing, cyanosis. Trace bilateral LE edema.  Neuro: Alert and oriented X 3. Psych:  Answers questions appropriately.   Labs: Basic Metabolic Panel: Recent Labs    05/30/23 0130  NA 124*  K 4.1  CL 91*  CO2 21*  GLUCOSE 108*  BUN 27*  CREATININE 1.16  CALCIUM 8.7*   Liver Function Tests: Recent Labs    05/30/23 0130  AST 24  ALT 18  ALKPHOS 75  BILITOT 1.1  PROT 7.2  ALBUMIN 4.1   No results for input(s): "LIPASE", "AMYLASE" in the last 72 hours. CBC: Recent Labs    05/30/23 0130  WBC  14.5*  NEUTROABS 11.9*  HGB 13.7  HCT 40.3  MCV 85.4  PLT 241   Cardiac Enzymes: Recent Labs    05/30/23 0130 05/30/23 0348  CKTOTAL  --  96  TROPONINIHS 196* 173*   BNP: Recent Labs    05/30/23 0130  BNP 2,575.3*   D-Dimer: No results for input(s): "DDIMER" in the last 72 hours. Hemoglobin A1C: No results for input(s): "HGBA1C" in the last 72 hours. Fasting Lipid Panel: No  results for input(s): "CHOL", "HDL", "LDLCALC", "TRIG", "CHOLHDL", "LDLDIRECT" in the last 72 hours. Thyroid Function Tests: No results for input(s): "TSH", "T4TOTAL", "T3FREE", "THYROIDAB" in the last 72 hours.  Invalid input(s): "FREET3" Anemia Panel: No results for input(s): "VITAMINB12", "FOLATE", "FERRITIN", "TIBC", "IRON", "RETICCTPCT" in the last 72 hours.   Radiology: [facility] Chest Port 1 View  Result Date: 05/30/2023 CLINICAL DATA:  Shortness of breath EXAM: PORTABLE CHEST 1 VIEW COMPARISON:  Chest x-ray 05/25/2023 FINDINGS: Cardiomegaly and central pulmonary vascular congestion persist. There is a new small right pleural effusion with right basilar opacities. No pneumothorax or acute fracture. IMPRESSION: 1. Cardiomegaly with central pulmonary vascular congestion. 2. New small right pleural effusion with right basilar atelectasis or infiltrate. Electronically Signed   By: Darliss Cheney M.D.   On: 05/30/2023 02:02   ECHOCARDIOGRAM COMPLETE  Result Date: 05/25/2023    ECHOCARDIOGRAM REPORT   Patient Name:   QUAY SIMKIN Date of Exam: 05/25/2023 Medical Rec #:  [id number]       Height:       69.0 in Accession #:    [id number]      Weight:       160.0 lb Date of Birth:  December 08, 1933       BSA:          1.879 m Patient Age:    88 years        BP:           152/84 mmHg Patient Gender: M               HR:           71 bpm. Exam Location:  [facility] Procedure: 2D Echo, Cardiac Doppler, Color Doppler, 3D Echo and Strain Analysis Indications:     CHF  History:         Patient has no prior history of Echocardiogram  examinations.                  CHF; Risk Factors:Hypertension and Dyslipidemia.  Sonographer:     Mikki Harbor Referring Phys:  [phone] Francoise Schaumann [place] Diagnosing Phys: Marcina Millard MD  Sonographer Comments: Global longitudinal strain was attempted. IMPRESSIONS  1. Left ventricular ejection fraction, by estimation, is 35 to 40%. The left ventricle has moderately decreased function. The left ventricle has no regional wall motion abnormalities. The left ventricular internal cavity size was moderately dilated. Left ventricular diastolic parameters were normal.  2. Right ventricular systolic function is normal. The right ventricular size is normal. There is moderately elevated pulmonary artery systolic pressure.  3. The mitral valve is normal in structure. Moderate to severe mitral valve regurgitation. No evidence of mitral stenosis.  4. The aortic valve is normal in structure. Aortic valve regurgitation is mild to moderate. No aortic stenosis is present.  5. The inferior vena cava is normal in size with greater than 50% respiratory variability, suggesting right atrial pressure of 3 mmHg. FINDINGS  Left Ventricle: Left ventricular ejection fraction, by estimation, is 35 to 40%. The left ventricle has moderately decreased function. The left ventricle has no regional wall motion abnormalities. The left ventricular internal cavity size was moderately  dilated. There is no left ventricular hypertrophy. Left ventricular diastolic parameters were normal. Right Ventricle: The right ventricular size is normal. No increase in right ventricular wall thickness. Right ventricular systolic function is normal. There is moderately elevated pulmonary artery systolic pressure. The tricuspid regurgitant velocity is 3.05 m/s, and with an assumed right atrial pressure  of 15 mmHg, the estimated right ventricular systolic pressure is 52.2 mmHg. Left Atrium: Left atrial size was normal in size. Right Atrium: Right atrial size was  normal in size. Pericardium: There is no evidence of pericardial effusion. Mitral Valve: The mitral valve is normal in structure. Moderate to severe mitral valve regurgitation. No evidence of mitral valve stenosis. MV peak gradient, 4.6 mmHg. The mean mitral valve gradient is 1.0 mmHg. Tricuspid Valve: The tricuspid valve is normal in structure. Tricuspid valve regurgitation is mild . No evidence of tricuspid stenosis. Aortic Valve: The aortic valve is normal in structure. Aortic valve regurgitation is mild to moderate. Aortic regurgitation PHT measures 403 msec. No aortic stenosis is present. Aortic valve mean gradient measures 4.0 mmHg. Aortic valve peak gradient measures 7.3 mmHg. Aortic valve area, by VTI measures 2.65 cm. Pulmonic Valve: The pulmonic valve was normal in structure. Pulmonic valve regurgitation is not visualized. No evidence of pulmonic stenosis. Aorta: The aortic root is normal in size and structure. Venous: The inferior vena cava is normal in size with greater than 50% respiratory variability, suggesting right atrial pressure of 3 mmHg. IAS/Shunts: No atrial level shunt detected by color flow Doppler.  LEFT VENTRICLE PLAX 2D LVIDd:         6.00 cm      Diastology LVIDs:         4.90 cm      LV e' medial:    4.68 cm/s LV PW:         1.00 cm      LV E/e' medial:  22.4 LV IVS:        1.10 cm      LV e' lateral:   4.90 cm/s LVOT diam:     2.00 cm      LV E/e' lateral: 21.4 LV SV:         78 LV SV Index:   42 LVOT Area:     3.14 cm  LV Volumes (MOD) LV vol d, MOD A2C: 144.0 ml LV vol d, MOD A4C: 134.0 ml LV vol s, MOD A2C: 89.1 ml LV vol s, MOD A4C: 83.4 ml LV SV MOD A2C:     54.9 ml LV SV MOD A4C:     134.0 ml LV SV MOD BP:      54.1 ml RIGHT VENTRICLE RV Basal diam:  4.50 cm RV Mid diam:    3.00 cm RV S prime:     6.85 cm/s TAPSE (M-mode): 1.9 cm LEFT ATRIUM             Index        RIGHT ATRIUM           Index LA diam:        5.10 cm 2.71 cm/m   RA Area:     18.70 cm LA Vol (A2C):   92.8 ml  49.39 ml/m  RA Volume:   59.70 ml  31.77 ml/m LA Vol (A4C):   71.5 ml 38.05 ml/m LA Biplane Vol: 93.9 ml 49.97 ml/m  AORTIC VALVE                    PULMONIC VALVE AV Area (Vmax):    2.54 cm     PV Vmax:       0.83 m/s AV Area (Vmean):   2.34 cm     PV Peak grad:  2.8 mmHg AV Area (VTI):     2.65 cm AV Vmax:  135.00 cm/s AV Vmean:          97.200 cm/s AV VTI:            0.295 m AV Peak Grad:      7.3 mmHg AV Mean Grad:      4.0 mmHg LVOT Vmax:         109.00 cm/s LVOT Vmean:        72.500 cm/s LVOT VTI:          0.249 m LVOT/AV VTI ratio: 0.84 AI PHT:            403 msec  AORTA Ao Root diam: 3.10 cm Ao Asc diam:  3.10 cm MITRAL VALVE                  TRICUSPID VALVE MV Area (PHT): 3.76 cm       TR Peak grad:   37.2 mmHg MV Area VTI:   2.58 cm       TR Vmax:        305.00 cm/s MV Peak grad:  4.6 mmHg MV Mean grad:  1.0 mmHg       SHUNTS MV Vmax:       1.07 m/s       Systemic VTI:  0.25 m MV Vmean:      53.7 cm/s      Systemic Diam: 2.00 cm MV Decel Time: 202 msec MR Peak grad:    71.6 mmHg MR Mean grad:    49.0 mmHg MR Vmax:         423.00 cm/s MR Vmean:        329.0 cm/s MR PISA:         1.90 cm MR PISA Eff ROA: 42 mm MR PISA Radius:  0.55 cm MV E velocity: 105.00 cm/s MV A velocity: 63.90 cm/s MV E/A ratio:  1.64 Marcina Millard MD Electronically signed by Marcina Millard MD Signature Date/Time: 05/25/2023/2:28:49 PM    Final    DG Chest Portable 1 View  Result Date: 05/25/2023 CLINICAL DATA:  Progressive dyspnea on exertion. EXAM: PORTABLE CHEST 1 VIEW COMPARISON:  CT chest 12/08/2019 FINDINGS: Aortic atherosclerotic calcifications. Mild cardiac enlargement. Trace left pleural effusion. Mild diffuse interstitial edema. No airspace consolidation. Osseous structures appear intact. IMPRESSION: Mild cardiac enlargement with signs of CHF. Electronically Signed   By: Signa Kell M.D.   On: 05/25/2023 06:06    TELEMETRY reviewed by me (LT) 05/30/2023 : NSR frequent PVCs rate 50-60s  EKG  reviewed by me: NSR 96, multiform PVC nonspecific TW changes  Data reviewed by me (LT) 05/30/2023: discharge summary from 7/6, ed note, admission H&P, last 24h vitals tele labs imaging I/O    Principal Problem:   Acute on chronic systolic (congestive) heart failure (HCC) Active Problems:   Essential hypertension   Elevated troponin   Acute on chronic systolic CHF (congestive heart failure) (HCC)   Acute respiratory failure with hypoxia (HCC)   Leukocytosis    ASSESSMENT AND PLAN:  Bron "Nadine Counts" Edwin Little is an 43yoM with a PMH of new HFrEF (EF 35-40% 05/25/2023), HTN, PAD with claudication, carotid stenosis, HLD, hx eosinophilic myocarditis, BPH with nocturia with recent admission from 7/5 to 7/6 for acute HFrEF  presented to [facility] ED early morning hours of 05/30/2023 with orthopnea and PND, worsening since hospital discharge.  Cardiology is consulted for further assistance.  # acute on chronic HFrEF  Presents with weight gain, continued orthopnea and PND progressively worsening after recent hospital  discharge despite medication and dietary compliance. Bnp 2500, CXR with large R pleural effusion, and an O2 requirement. Clinically hypervolemic with absent breath sounds in R base and crackles on Left. - s/p IV lasix 80mg  x1, continue 40mg  BID for today and monitor response - agree with right thoracentesis per primary  - GDMT with metoprolol XL 25mg  daily, losartan 100mg  daily.  - start spiro 12.5mg  daily. Consider changing ARB to ARNI and addition of [id number] pending BP and renal function - strict I/O, daily weights  # demand ischemia  Borderline enzyme elevation at 196, 173 without chest pain or EKG changes, which is most consistent with demand/supply mismatch and not ACS. Defer ischemic eval for now, likely as planned on an outpatient basis once volume status optimized.   This patient's plan of care was discussed and created with Dr. Juliann Pares and he is in agreement.  Signed: Rebeca Allegra  , PA-C 05/30/2023, 9:26 AM [facility]

## 2023-05-30 NOTE — ED Triage Notes (Signed)
Patient presents from home with [facility] and hypertension. Patient was seen on 05/25/23 with new diagnoss of CHF.  Patient is dyspneic with exertion. No other distress noted. Denies pain. Does sstate when he lays down he needs to sit upright to breathe  EMS stats 158/84 HR 72 NSR with pvcs 3 81mg  asa prior to EMS arrival.

## 2023-05-30 NOTE — ED Notes (Signed)
Pt to US at this time.

## 2023-05-31 ENCOUNTER — Other Ambulatory Visit (HOSPITAL_COMMUNITY): Payer: Self-pay

## 2023-05-31 DIAGNOSIS — I5023 Acute on chronic systolic (congestive) heart failure: Secondary | ICD-10-CM | POA: Diagnosis not present

## 2023-05-31 LAB — CBC
HCT: 38.8 % — ABNORMAL LOW (ref 39.0–52.0)
Hemoglobin: 13.1 g/dL (ref 13.0–17.0)
MCH: 28.9 pg (ref 26.0–34.0)
MCHC: 33.8 g/dL (ref 30.0–36.0)
MCV: 85.5 fL (ref 80.0–100.0)
Platelets: 218 10*3/uL (ref 150–400)
RBC: 4.54 MIL/uL (ref 4.22–5.81)
RDW: 13.6 % (ref 11.5–15.5)
WBC: 9.6 10*3/uL (ref 4.0–10.5)
nRBC: 0 % (ref 0.0–0.2)

## 2023-05-31 LAB — BASIC METABOLIC PANEL
Anion gap: 9 (ref 5–15)
BUN: 23 mg/dL (ref 8–23)
CO2: 24 mmol/L (ref 22–32)
Calcium: 7.8 mg/dL — ABNORMAL LOW (ref 8.9–10.3)
Chloride: 92 mmol/L — ABNORMAL LOW (ref 98–111)
Creatinine, Ser: 1.19 mg/dL (ref 0.61–1.24)
GFR, Estimated: 59 mL/min — ABNORMAL LOW (ref >60)
Glucose, Bld: 121 mg/dL — ABNORMAL HIGH (ref 70–99)
Potassium: 3.3 mmol/L — ABNORMAL LOW (ref 3.5–5.1)
Sodium: 125 mmol/L — ABNORMAL LOW (ref 135–145)

## 2023-05-31 LAB — SEDIMENTATION RATE: Sed Rate: 4 mm/hr (ref 0–20)

## 2023-05-31 MED ORDER — POTASSIUM CHLORIDE CRYS ER 20 MEQ PO TBCR: 40.0000 meq | EXTENDED_RELEASE_TABLET | Freq: Every day | ORAL | Status: DC

## 2023-05-31 MED ORDER — SACUBITRIL-VALSARTAN 24-26 MG PO TABS
1.0000 | ORAL_TABLET | Freq: Two times a day (BID) | ORAL | Status: DC
  Administered 2023-05-31 – 2023-06-01 (×2): 1 via ORAL
  Filled 2023-05-31 (×4): qty 1

## 2023-05-31 MED ORDER — FUROSEMIDE 10 MG/ML IJ SOLN
60.0000 mg | Freq: Two times a day (BID) | INTRAMUSCULAR | Status: DC
  Administered 2023-05-31: 60 mg via INTRAVENOUS
  Filled 2023-05-31: qty 6

## 2023-05-31 MED ORDER — POTASSIUM CHLORIDE CRYS ER 20 MEQ PO TBCR
40.0000 meq | EXTENDED_RELEASE_TABLET | Freq: Two times a day (BID) | ORAL | Status: AC
  Administered 2023-05-31 – 2023-06-01 (×2): 40 meq via ORAL
  Filled 2023-05-31 (×2): qty 2

## 2023-05-31 NOTE — TOC Benefit Eligibility Note (Signed)
Pharmacy Patient Advocate Encounter  Insurance verification completed.    The patient is insured through [employer]    Ran test claim for [employer] 24-26mg  and the current 30 day co-pay is $45.  Ran test claim for Jardiance 10mg  and the current 30 day co-pay is $45.  Ran test claim for Farxiga 10mg  and the current 30 day co-pay is $45.  This test claim was processed through [employer]- copay amounts may vary at other pharmacies due to [employer], or as the patient moves through the different stages of their insurance plan.

## 2023-05-31 NOTE — Progress Notes (Signed)
[facility]IN[facility] CARDIOLOGY CONSULT NOTE       Patient ID: Brian Little MRN: [id number] DOB/AGE: 26-Jul-1934 89 y.o.  Admit date: 05/30/2023 Referring Physician Dr. Lindajo Royal Primary Physician Dr. Graciela Husbands Primary Cardiologist Dr. Darrold Junker Reason for Consultation acute on chronic HFrEF  HPI: Moe "Nadine Counts" Edwin Dada is an 89yoM with a PMH of new HFrEF (EF 35-40% 05/25/2023), HTN, PAD with claudication, carotid stenosis, HLD, hx eosinophilic myocarditis, BPH with nocturia with recent admission from 7/5 to 7/6 for acute HFrEF  presented to [facility] ED early morning hours of 05/30/2023 with orthopnea and PND, worsening since hospital discharge.  Cardiology is consulted for further assistance.  Interval History:  - s/p R thoracentesis with 1.2L fluid aspirated, transudative effusion by cytology -Denies chest pain, orthopnea, PND, no dyspnea while ambulating in the room -Still making okay urine with IV Lasix  Review of systems complete and found to be negative unless listed above     Past Medical History:  Diagnosis Date   BPH (benign prostatic hyperplasia)    Cancer (HCC)    skin   Hypertension     Past Surgical History:  Procedure Laterality Date   ADENOIDECTOMY     89 yrs old   PARTIAL COLECTOMY     polyps/ appendix removed at same time   ROTATOR CUFF REPAIR     x 2, 2008, 2011   ROTATOR CUFF REPAIR     left and right   SHOULDER ARTHROSCOPY WITH OPEN ROTATOR CUFF REPAIR AND DISTAL CLAVICLE ACROMINECTOMY Left 10/31/2022   Procedure: SHOULDER ARTHROSCOPY WITH OPEN ROTATOR CUFF REPAIR AND DISTAL CLAVICLE ACROMINECTOMY;  Surgeon: Juanell Fairly, MD;  Location: [facility] ORS;  Service: Orthopedics;  Laterality: Left;   TONSILLECTOMY     89 years old    (Not in a hospital admission)  Social History   Socioeconomic History   Marital status: Married    Spouse name: Not on file   Number of children: Not on file   Years of education: Not on file   Highest education level: Not on file   Occupational History   Not on file  Tobacco Use   Smoking status: Former    Current packs/day: 0.00    Types: Cigarettes    Quit date: 01/10/1980    Years since quitting: 43.4   Smokeless tobacco: Never  Vaping Use   Vaping status: Never Used  Substance and Sexual Activity   Alcohol use: Never   Drug use: Never   Sexual activity: Yes    Birth control/protection: None  Other Topics Concern   Not on file  Social History Narrative   Not on file   Social Determinants of Health   Financial Resource Strain: Not on file  Food Insecurity: No Food Insecurity (05/30/2023)   Hunger Vital Sign    Worried About Running Out of Food in the Last Year: Never true    Ran Out of Food in the Last Year: Never true  Transportation Needs: No Transportation Needs (05/30/2023)   PRAPARE - Administrator, Civil Service (Medical): No    Lack of Transportation (Non-Medical): No  Physical Activity: Not on file  Stress: Not on file  Social Connections: Not on file  Intimate Partner Violence: Not At Risk (05/30/2023)   Humiliation, Afraid, Rape, and Kick questionnaire    Fear of Current or Ex-Partner: No    Emotionally Abused: No    Physically Abused: No    Sexually Abused: No    Family History  Problem Relation Age of Onset   Heart disease Father    Congestive Heart Failure Father    COPD Mother    Hypertension Mother       Intake/Output Summary (Last 24 hours) at 05/31/2023 0806 Last data filed at 05/30/2023 2000 Gross per 24 hour  Intake --  Output 600 ml  Net -600 ml    Vitals:   05/31/23 0200 05/31/23 0400 05/31/23 0500 05/31/23 0545  BP: 127/80 (!) 132/115 (!) 122/53   Pulse: 80 93 83 80  Resp: (!) 21 (!) 25 16 (!) 21  Temp:      TempSrc:      SpO2: 95% 94% 97% 97%  Weight:      Height:        PHYSICAL EXAM General: pleasant thin elderly caucasian male , well nourished, in no acute distress. Sitting up in bed  HEENT:  Normocephalic and atraumatic. Neck:  No  JVD.  Lungs: Normal respiratory effort on room air, trace crackles right base, clear to auscultation on the left.   Heart: HRRR . Normal S1 and S2 without gallops or murmurs.  Abdomen: mildly distended appearing.  Msk: Normal strength and tone for age. Extremities: Warm and well perfused. No clubbing, cyanosis. Trace bilateral LE edema.  Neuro: Alert and oriented X 3. Psych:  Answers questions appropriately.   Labs: Basic Metabolic Panel: Recent Labs    05/30/23 0130 05/31/23 0434  NA 124* 125*  K 4.1 3.3*  CL 91* 92*  CO2 21* 24  GLUCOSE 108* 121*  BUN 27* 23  CREATININE 1.16 1.19  CALCIUM 8.7* 7.8*   Liver Function Tests: Recent Labs    05/30/23 0130  AST 24  ALT 18  ALKPHOS 75  BILITOT 1.1  PROT 7.2  ALBUMIN 4.1   No results for input(s): "LIPASE", "AMYLASE" in the last 72 hours. CBC: Recent Labs    05/30/23 0130 05/31/23 0434  WBC 14.5* 9.6  NEUTROABS 11.9*  --   HGB 13.7 13.1  HCT 40.3 38.8*  MCV 85.4 85.5  PLT 241 218   Cardiac Enzymes: Recent Labs    05/30/23 0130 05/30/23 0348  CKTOTAL  --  96  TROPONINIHS 196* 173*   BNP: Recent Labs    05/30/23 0130  BNP 2,575.3*   D-Dimer: No results for input(s): "DDIMER" in the last 72 hours. Hemoglobin A1C: No results for input(s): "HGBA1C" in the last 72 hours. Fasting Lipid Panel: No results for input(s): "CHOL", "HDL", "LDLCALC", "TRIG", "CHOLHDL", "LDLDIRECT" in the last 72 hours. Thyroid Function Tests: No results for input(s): "TSH", "T4TOTAL", "T3FREE", "THYROIDAB" in the last 72 hours.  Invalid input(s): "FREET3" Anemia Panel: No results for input(s): "VITAMINB12", "FOLATE", "FERRITIN", "TIBC", "IRON", "RETICCTPCT" in the last 72 hours.   Radiology: US THORACENTESIS ASP PLEURAL SPACE W/IMG GUIDE  Result Date: 05/30/2023 INDICATION: Patient with a history of heart failure presents today with pleural effusions. Interventional radiology asked to perform a diagnostic and therapeutic  thoracentesis. EXAM: ULTRASOUND GUIDED THORACENTESIS MEDICATIONS: 1% lidocaine 10 mL COMPLICATIONS: None immediate. PROCEDURE: An ultrasound guided thoracentesis was thoroughly discussed with the patient and questions answered. The benefits, risks, alternatives and complications were also discussed. The patient understands and wishes to proceed with the procedure. Written consent was obtained. Ultrasound was performed to localize and mark an adequate pocket of fluid in the right chest. The area was then prepped and draped in the normal sterile fashion. 1% Lidocaine was used for local anesthesia. Under ultrasound guidance a 6 Fr  Safe-T-Centesis catheter was introduced. Thoracentesis was performed. The catheter was removed and a dressing applied. FINDINGS: A total of approximately 1.2 L of clear yellow fluid was removed. Samples were sent to the laboratory as requested by the clinical team. IMPRESSION: Successful ultrasound guided right thoracentesis yielding 1.2 L of pleural fluid. Procedure performed by Alwyn Ren NP and was supervised by Dr. Juliette Alcide. Electronically Signed   By: Olive Bass M.D.   On: 05/30/2023 11:54   DG Chest Port 1 View  Result Date: 05/30/2023 CLINICAL DATA:  142230 Pleural effusion 142230 [id number] Status post thoracentesis [id number]. EXAM: PORTABLE CHEST 1 VIEW COMPARISON:  05/30/2023. FINDINGS: Near-complete resolution of right pleural effusion with improved aeration of the right lung base. No pneumothorax. Unchanged moderate pulmonary edema with slightly increased small left pleural effusion and left basilar atelectasis. Similar cardiomegaly and mediastinal contours. IMPRESSION: 1. Near-complete resolution of right pleural effusion with improved aeration of the right lung base. No pneumothorax. 2. Unchanged moderate pulmonary edema with slightly increased small left pleural effusion and left basilar atelectasis. Electronically Signed   By: Orvan Falconer M.D.   On: 05/30/2023 11:09    DG Chest Port 1 View  Result Date: 05/30/2023 CLINICAL DATA:  Shortness of breath EXAM: PORTABLE CHEST 1 VIEW COMPARISON:  Chest x-ray 05/25/2023 FINDINGS: Cardiomegaly and central pulmonary vascular congestion persist. There is a new small right pleural effusion with right basilar opacities. No pneumothorax or acute fracture. IMPRESSION: 1. Cardiomegaly with central pulmonary vascular congestion. 2. New small right pleural effusion with right basilar atelectasis or infiltrate. Electronically Signed   By: Darliss Cheney M.D.   On: 05/30/2023 02:02   ECHOCARDIOGRAM COMPLETE  Result Date: 05/25/2023    ECHOCARDIOGRAM REPORT   Patient Name:   TAM DELISLE Date of Exam: 05/25/2023 Medical Rec #:  [id number]       Height:       69.0 in Accession #:    [id number]      Weight:       160.0 lb Date of Birth:  25-Jan-1934       BSA:          1.879 m Patient Age:    88 years        BP:           152/84 mmHg Patient Gender: M               HR:           71 bpm. Exam Location:  [facility] Procedure: 2D Echo, Cardiac Doppler, Color Doppler, 3D Echo and Strain Analysis Indications:     CHF  History:         Patient has no prior history of Echocardiogram examinations.                  CHF; Risk Factors:Hypertension and Dyslipidemia.  Sonographer:     Mikki Harbor Referring Phys:  [phone] Francoise Schaumann [place] Diagnosing Phys: Marcina Millard MD  Sonographer Comments: Global longitudinal strain was attempted. IMPRESSIONS  1. Left ventricular ejection fraction, by estimation, is 35 to 40%. The left ventricle has moderately decreased function. The left ventricle has no regional wall motion abnormalities. The left ventricular internal cavity size was moderately dilated. Left ventricular diastolic parameters were normal.  2. Right ventricular systolic function is normal. The right ventricular size is normal. There is moderately elevated pulmonary artery systolic pressure.  3. The mitral valve is normal in structure. Moderate to severe  mitral valve regurgitation. No  evidence of mitral stenosis.  4. The aortic valve is normal in structure. Aortic valve regurgitation is mild to moderate. No aortic stenosis is present.  5. The inferior vena cava is normal in size with greater than 50% respiratory variability, suggesting right atrial pressure of 3 mmHg. FINDINGS  Left Ventricle: Left ventricular ejection fraction, by estimation, is 35 to 40%. The left ventricle has moderately decreased function. The left ventricle has no regional wall motion abnormalities. The left ventricular internal cavity size was moderately  dilated. There is no left ventricular hypertrophy. Left ventricular diastolic parameters were normal. Right Ventricle: The right ventricular size is normal. No increase in right ventricular wall thickness. Right ventricular systolic function is normal. There is moderately elevated pulmonary artery systolic pressure. The tricuspid regurgitant velocity is 3.05 m/s, and with an assumed right atrial pressure of 15 mmHg, the estimated right ventricular systolic pressure is 52.2 mmHg. Left Atrium: Left atrial size was normal in size. Right Atrium: Right atrial size was normal in size. Pericardium: There is no evidence of pericardial effusion. Mitral Valve: The mitral valve is normal in structure. Moderate to severe mitral valve regurgitation. No evidence of mitral valve stenosis. MV peak gradient, 4.6 mmHg. The mean mitral valve gradient is 1.0 mmHg. Tricuspid Valve: The tricuspid valve is normal in structure. Tricuspid valve regurgitation is mild . No evidence of tricuspid stenosis. Aortic Valve: The aortic valve is normal in structure. Aortic valve regurgitation is mild to moderate. Aortic regurgitation PHT measures 403 msec. No aortic stenosis is present. Aortic valve mean gradient measures 4.0 mmHg. Aortic valve peak gradient measures 7.3 mmHg. Aortic valve area, by VTI measures 2.65 cm. Pulmonic Valve: The pulmonic valve was normal in  structure. Pulmonic valve regurgitation is not visualized. No evidence of pulmonic stenosis. Aorta: The aortic root is normal in size and structure. Venous: The inferior vena cava is normal in size with greater than 50% respiratory variability, suggesting right atrial pressure of 3 mmHg. IAS/Shunts: No atrial level shunt detected by color flow Doppler.  LEFT VENTRICLE PLAX 2D LVIDd:         6.00 cm      Diastology LVIDs:         4.90 cm      LV e' medial:    4.68 cm/s LV PW:         1.00 cm      LV E/e' medial:  22.4 LV IVS:        1.10 cm      LV e' lateral:   4.90 cm/s LVOT diam:     2.00 cm      LV E/e' lateral: 21.4 LV SV:         78 LV SV Index:   42 LVOT Area:     3.14 cm  LV Volumes (MOD) LV vol d, MOD A2C: 144.0 ml LV vol d, MOD A4C: 134.0 ml LV vol s, MOD A2C: 89.1 ml LV vol s, MOD A4C: 83.4 ml LV SV MOD A2C:     54.9 ml LV SV MOD A4C:     134.0 ml LV SV MOD BP:      54.1 ml RIGHT VENTRICLE RV Basal diam:  4.50 cm RV Mid diam:    3.00 cm RV S prime:     6.85 cm/s TAPSE (M-mode): 1.9 cm LEFT ATRIUM             Index        RIGHT ATRIUM  Index LA diam:        5.10 cm 2.71 cm/m   RA Area:     18.70 cm LA Vol (A2C):   92.8 ml 49.39 ml/m  RA Volume:   59.70 ml  31.77 ml/m LA Vol (A4C):   71.5 ml 38.05 ml/m LA Biplane Vol: 93.9 ml 49.97 ml/m  AORTIC VALVE                    PULMONIC VALVE AV Area (Vmax):    2.54 cm     PV Vmax:       0.83 m/s AV Area (Vmean):   2.34 cm     PV Peak grad:  2.8 mmHg AV Area (VTI):     2.65 cm AV Vmax:           135.00 cm/s AV Vmean:          97.200 cm/s AV VTI:            0.295 m AV Peak Grad:      7.3 mmHg AV Mean Grad:      4.0 mmHg LVOT Vmax:         109.00 cm/s LVOT Vmean:        72.500 cm/s LVOT VTI:          0.249 m LVOT/AV VTI ratio: 0.84 AI PHT:            403 msec  AORTA Ao Root diam: 3.10 cm Ao Asc diam:  3.10 cm MITRAL VALVE                  TRICUSPID VALVE MV Area (PHT): 3.76 cm       TR Peak grad:   37.2 mmHg MV Area VTI:   2.58 cm       TR Vmax:         305.00 cm/s MV Peak grad:  4.6 mmHg MV Mean grad:  1.0 mmHg       SHUNTS MV Vmax:       1.07 m/s       Systemic VTI:  0.25 m MV Vmean:      53.7 cm/s      Systemic Diam: 2.00 cm MV Decel Time: 202 msec MR Peak grad:    71.6 mmHg MR Mean grad:    49.0 mmHg MR Vmax:         423.00 cm/s MR Vmean:        329.0 cm/s MR PISA:         1.90 cm MR PISA Eff ROA: 42 mm MR PISA Radius:  0.55 cm MV E velocity: 105.00 cm/s MV A velocity: 63.90 cm/s MV E/A ratio:  1.64 Marcina Millard MD Electronically signed by Marcina Millard MD Signature Date/Time: 05/25/2023/2:28:49 PM    Final    DG Chest Portable 1 View  Result Date: 05/25/2023 CLINICAL DATA:  Progressive dyspnea on exertion. EXAM: PORTABLE CHEST 1 VIEW COMPARISON:  CT chest 12/08/2019 FINDINGS: Aortic atherosclerotic calcifications. Mild cardiac enlargement. Trace left pleural effusion. Mild diffuse interstitial edema. No airspace consolidation. Osseous structures appear intact. IMPRESSION: Mild cardiac enlargement with signs of CHF. Electronically Signed   By: Signa Kell M.D.   On: 05/25/2023 06:06    TELEMETRY reviewed by me (LT) 05/31/2023 : NSR frequent PVCs rate 50-60s  EKG reviewed by me: NSR 96, multiform PVC nonspecific TW changes  Data reviewed by me (LT) 05/31/2023: discharge summary from 7/6, ed note, admission H&P, last 24h  vitals tele labs imaging I/O    Principal Problem:   Acute on chronic systolic (congestive) heart failure (HCC) Active Problems:   Essential hypertension   Elevated troponin   Acute on chronic systolic CHF (congestive heart failure) (HCC)   Acute respiratory failure with hypoxia (HCC)   Leukocytosis    ASSESSMENT AND PLAN:  Quaid "Nadine Counts" Edwin Dada is an 58yoM with a PMH of new HFrEF (EF 35-40% 05/25/2023), HTN, PAD with claudication, carotid stenosis, HLD, hx eosinophilic myocarditis, BPH with nocturia with recent admission from 7/5 to 7/6 for acute HFrEF  presented to [facility] ED early morning hours of 05/30/2023  with orthopnea and PND, worsening since hospital discharge.  Cardiology is consulted for further assistance.  # acute on chronic HFrEF  # R pleural effusion  Presents with weight gain, continued orthopnea and PND progressively worsening after recent hospital discharge despite medication and dietary compliance. Bnp 2500, CXR with large R pleural effusion, and an O2 requirement.  Now s/p thoracentesis yielding 1.2 L of a transudate effusion with clinical improvement, now weaned to room air. -Increase IV Lasix to 60 mg this evening and tomorrow morning, monitor response.  Likely discharge home on 20 mg of torsemide daily rather than furosemide 40mg  as he was previously prescribed. - GDMT with metoprolol XL 25mg  daily and spironolactone 12.5 mg daily -Change losartan 100 to Entresto 24-26 twice daily -Add sglt2i pending BP and renal function - strict I/O, daily weights  # demand ischemia  Borderline enzyme elevation at 196, 173 without chest pain or EKG changes, which is most consistent with demand/supply mismatch and not ACS. Defer ischemic eval for now, likely as planned on an outpatient basis once volume status optimized.   Anticipate discharge readiness from a cardiac perspective tomorrow early afternoon following diuretic response today and adjustment to new medications.  This patient's plan of care was discussed and created with Dr. Juliann Pares and he is in agreement.  Signed: Rebeca Allegra , PA-C 05/31/2023, 8:06 AM [facility]

## 2023-05-31 NOTE — TOC CM/SW Note (Signed)
Cm received TOC consult for SNF placement. Awaiting on PT/OT evaluation. Will continue to follow for toc needs.

## 2023-05-31 NOTE — Progress Notes (Signed)
[facility]  - [place] [facility]   PATIENT NAME: Brian Little    MR#:  [id number]  DATE OF BIRTH:  August 17, 1934  SUBJECTIVE:  seen earlier. No family at bedside. I wife on her way. Left message for son Dr. Tiburcio Pea regarding update.  Patient came in after increasing shortness of breath at home. Recently discharged over the weekend with new onset CHF. Had some cold sweat. Denies any chest pain or fever.  Patient sitting out in the chair. Feels a lot better. Sats more than 92% on room air. No chest pain. Breathing improved ambulated in the ER with RN  VITALS:  Blood pressure (!) 122/53, pulse 80, temperature (!) 97.4 F (36.3 C), temperature source Oral, resp. rate (!) 21, height 5\' 9"  (1.753 m), weight 76.2 kg, SpO2 97%.  PHYSICAL EXAMINATION:   GENERAL:  89 y.o.-year-old patient with no acute distress.  LUNGS: breath sounds improved, no wheezing CARDIOVASCULAR: S1, S2 normal. No murmur   ABDOMEN: Soft, nontender, nondistended. Bowel sounds present.  EXTREMITIES: No  edema b/l.    NEUROLOGIC: nonfocal  patient is alert and awake SKIN: No obvious rash, lesion, or ulcer.   LABORATORY PANEL:  CBC Recent Labs  Lab 05/31/23 0434  WBC 9.6  HGB 13.1  HCT 38.8*  PLT 218    Chemistries  Recent Labs  Lab 05/25/23 0824 05/25/23 1239 05/30/23 0130 05/31/23 0434  NA  --    < > 124* 125*  K  --    < > 4.1 3.3*  CL  --    < > 91* 92*  CO2  --    < > 21* 24  GLUCOSE  --    < > 108* 121*  BUN  --    < > 27* 23  CREATININE  --    < > 1.16 1.19  CALCIUM  --    < > 8.7* 7.8*  MG 2.1  --   --   --   AST  --   --  24  --   ALT  --   --  18  --   ALKPHOS  --   --  75  --   BILITOT  --   --  1.1  --    < > = values in this interval not displayed.   Cardiac Enzymes No results for input(s): "TROPONINI" in the last 168 hours. RADIOLOGY:  US THORACENTESIS ASP PLEURAL SPACE W/IMG GUIDE  Result Date: 05/30/2023 INDICATION: Patient with a history of heart failure  presents today with pleural effusions. Interventional radiology asked to perform a diagnostic and therapeutic thoracentesis. EXAM: ULTRASOUND GUIDED THORACENTESIS MEDICATIONS: 1% lidocaine 10 mL COMPLICATIONS: None immediate. PROCEDURE: An ultrasound guided thoracentesis was thoroughly discussed with the patient and questions answered. The benefits, risks, alternatives and complications were also discussed. The patient understands and wishes to proceed with the procedure. Written consent was obtained. Ultrasound was performed to localize and mark an adequate pocket of fluid in the right chest. The area was then prepped and draped in the normal sterile fashion. 1% Lidocaine was used for local anesthesia. Under ultrasound guidance a 6 Fr Safe-T-Centesis catheter was introduced. Thoracentesis was performed. The catheter was removed and a dressing applied. FINDINGS: A total of approximately 1.2 L of clear yellow fluid was removed. Samples were sent to the laboratory as requested by the clinical team. IMPRESSION: Successful ultrasound guided right thoracentesis yielding 1.2 L of pleural fluid. Procedure performed by Alwyn Ren NP and  was supervised by Dr. Juliette Alcide. Electronically Signed   By: Olive Bass M.D.   On: 05/30/2023 11:54   DG Chest Port 1 View  Result Date: 05/30/2023 CLINICAL DATA:  142230 Pleural effusion 142230 [id number] Status post thoracentesis [id number]. EXAM: PORTABLE CHEST 1 VIEW COMPARISON:  05/30/2023. FINDINGS: Near-complete resolution of right pleural effusion with improved aeration of the right lung base. No pneumothorax. Unchanged moderate pulmonary edema with slightly increased small left pleural effusion and left basilar atelectasis. Similar cardiomegaly and mediastinal contours. IMPRESSION: 1. Near-complete resolution of right pleural effusion with improved aeration of the right lung base. No pneumothorax. 2. Unchanged moderate pulmonary edema with slightly increased small left pleural  effusion and left basilar atelectasis. Electronically Signed   By: Orvan Falconer M.D.   On: 05/30/2023 11:09   DG Chest Port 1 View  Result Date: 05/30/2023 CLINICAL DATA:  Shortness of breath EXAM: PORTABLE CHEST 1 VIEW COMPARISON:  Chest x-ray 05/25/2023 FINDINGS: Cardiomegaly and central pulmonary vascular congestion persist. There is a new small right pleural effusion with right basilar opacities. No pneumothorax or acute fracture. IMPRESSION: 1. Cardiomegaly with central pulmonary vascular congestion. 2. New small right pleural effusion with right basilar atelectasis or infiltrate. Electronically Signed   By: Darliss Cheney M.D.   On: 05/30/2023 02:02    Assessment and Plan   Brian Little is a 89 y.o. male with medical history significant for BPH, hypertension, bile acid associated diarrhea, PAD with claudication, remote history of eosinophilic myocarditis, former smoker hospitalized overnight from 7/5 to 05/26/2023 with CHF exacerbation with demand ischemia ([id number]>139>690) with echo showing EF 35 to 40% and severe MR and mild to moderate AR, seen by cardiology with recommendation for outpatient ischemic evaluation, who returns to the ED with persistent shortness of breath and orthopnea.  Denies lower extremity edema.  He denies chest pain, fever or chills.   Chest x-ray showed cardiomegaly with central pulmonary vascular congestion with a new right pleural effusion with right basilar atelectasis or infiltrate   Acute on chronic systolic CHF (congestive heart failure) (HCC) Elevated troponin Severe mitral regurgitation History of eosinophilic myocarditis hyponatremia --New CHF diagnosis on 7/5 with EF 35 to 40% --IV Lasix, metoprolol and losartan --Daily weights with intake and output monitoring --Cardiology reconsulted --Dr Juliann Pares to see pt -pt now on lasix 60 mg IV bid, enteresto, spironolactone --on RA --sodium 124--125  Right Pleural effusion--new --s/p US guided  thoracentesis with removal of 1.2 L transudate fluid   Acute respiratory failure with hypoxia (HCC) --Patient had increased work of breathing tachypneic to 25 with O2 sat in the 80s requiring 2 L Secondary to CHF --With leukocytosis and findings on chest x-ray some concern for pneumonia-- clinically does not seem to be pneumonia however will empirically treated for five days with Zithromax. Pro calcitonin less than .01 -Supplemental oxygen to keep sats over 94% --clinically stable. WBC normal--dc abxs   Leukocytosis --WBC 14,000 and chest x-ray showing new right pleural effusion with right basilar atelectasis or infiltrate -- COVID negative -- clinically does not seem to be pneumonia. D/c abxs --wbc 9.0  Essential hypertension Continue metoprolol   ambulated by himself with RN. Sats remain more than 93%. Will continue to monitor.      DVT prophylaxis: Lovenox   Consults: Cardiology Dr. Juliann Pares   Advance Care Planning:   Code Status: full   Family Communication: left VM with son Dr Tiburcio Pea   Procedures: right sided US guided Thoracentesis Level of care: Progressive Status is:  Inpatient Remains inpatient appropriate because: CHF acute on Chronic CHF    TOTAL TIME TAKING CARE OF THIS PATIENT: 35 minutes.  >50% time spent on counselling and coordination of care  Note: This dictation was prepared with Dragon dictation along with smaller phrase technology. Any transcriptional errors that result from this process are unintentional.  Enedina Finner M.D    [facility]   CC: Primary care physician; Lynnea Ferrier, MD

## 2023-06-01 DIAGNOSIS — I5023 Acute on chronic systolic (congestive) heart failure: Secondary | ICD-10-CM | POA: Diagnosis not present

## 2023-06-01 LAB — BASIC METABOLIC PANEL
Anion gap: 10 (ref 5–15)
BUN: 21 mg/dL (ref 8–23)
CO2: 23 mmol/L (ref 22–32)
Calcium: 8.2 mg/dL — ABNORMAL LOW (ref 8.9–10.3)
Chloride: 92 mmol/L — ABNORMAL LOW (ref 98–111)
Creatinine, Ser: 1.13 mg/dL (ref 0.61–1.24)
GFR, Estimated: >60 mL/min (ref >60)
Glucose, Bld: 97 mg/dL (ref 70–99)
Potassium: 3.5 mmol/L (ref 3.5–5.1)
Sodium: 125 mmol/L — ABNORMAL LOW (ref 135–145)

## 2023-06-01 MED ORDER — SACUBITRIL-VALSARTAN 24-26 MG PO TABS: 1.0000 | ORAL_TABLET | Freq: Two times a day (BID) | ORAL | 1 refills | Status: DC

## 2023-06-01 MED ORDER — MELATONIN 5 MG PO TABS: 2.5000 mg | ORAL_TABLET | Freq: Every day | ORAL | Status: DC

## 2023-06-01 MED ORDER — MELATONIN 5 MG PO TABS
2.5000 mg | ORAL_TABLET | Freq: Every evening | ORAL | Status: DC | PRN
  Administered 2023-06-01: 2.5 mg via ORAL
  Filled 2023-06-01: qty 1

## 2023-06-01 MED ORDER — TORSEMIDE 20 MG PO TABS: 20.0000 mg | ORAL_TABLET | Freq: Every day | ORAL | Status: DC

## 2023-06-01 MED ORDER — FUROSEMIDE 10 MG/ML IJ SOLN
60.0000 mg | Freq: Once | INTRAMUSCULAR | Status: AC
  Administered 2023-06-01: 60 mg via INTRAVENOUS
  Filled 2023-06-01: qty 6

## 2023-06-01 MED ORDER — SPIRONOLACTONE 25 MG PO TABS: 12.5000 mg | ORAL_TABLET | Freq: Every day | ORAL | 1 refills | Status: DC

## 2023-06-01 MED ORDER — TORSEMIDE 20 MG PO TABS: 20.0000 mg | ORAL_TABLET | Freq: Every day | ORAL | 1 refills | Status: DC

## 2023-06-01 NOTE — Progress Notes (Signed)
[facility] CLINIC CARDIOLOGY CONSULT NOTE       Patient ID: Brian Little MRN: [id number] DOB/AGE: [birth year] 89 y.o.  Admit date: 05/30/2023 Referring Physician Dr. Lindajo Royal Primary Physician Dr. Graciela Husbands Primary Cardiologist Dr. Darrold Junker Reason for Consultation acute on chronic HFrEF  HPI: Brian Little is an 89yoM with a PMH of new HFrEF (EF 35-40% 05/25/2023), HTN, PAD with claudication, carotid stenosis, HLD, hx eosinophilic myocarditis, BPH with nocturia with recent admission from 7/5 to 7/6 for acute HFrEF  presented to [facility] ED early morning hours of 05/30/2023 with orthopnea and PND, worsening since hospital discharge.  Cardiology is consulted for further assistance.  Interval History:  -Patient feeling well this AM, denies any recurrent SOB, orthopnea, swelling.  -Net negative 1.1 L, received 60 mg IV Lasix this AM.  -Patient reports he is ready to go home today, expressed understanding of importance of fluid/salt restrictions.   Review of systems complete and found to be negative unless listed above     Past Medical History:  Diagnosis Date   BPH (benign prostatic hyperplasia)    Cancer (HCC)    skin   Hypertension     Past Surgical History:  Procedure Laterality Date   ADENOIDECTOMY     89 yrs old   PARTIAL COLECTOMY     polyps/ appendix removed at same time   ROTATOR CUFF REPAIR     x 2, 2008, 2011   ROTATOR CUFF REPAIR     left and right   SHOULDER ARTHROSCOPY WITH OPEN ROTATOR CUFF REPAIR AND DISTAL CLAVICLE ACROMINECTOMY Left 10/31/2022   Procedure: SHOULDER ARTHROSCOPY WITH OPEN ROTATOR CUFF REPAIR AND DISTAL CLAVICLE ACROMINECTOMY;  Surgeon: Juanell Fairly, MD;  Location: [facility] ORS;  Service: Orthopedics;  Laterality: Left;   TONSILLECTOMY     89 years old    Medications Prior to Admission  Medication Sig Dispense Refill Last Dose   acetaminophen (TYLENOL) 500 MG tablet Take 500 mg by mouth every 6 (six) hours as needed.   unknown at prn    aspirin EC 81 MG tablet Take 1 tablet (81 mg total) by mouth daily. Swallow whole. 30 tablet 12 05/29/2023   cetirizine (ZYRTEC) 10 MG tablet 10 mg daily as needed for allergies.   unknown at prn   cholestyramine (QUESTRAN) 4 g packet Take 4 g by mouth in the morning.   05/29/2023   fluticasone (FLONASE) 50 MCG/ACT nasal spray Place 1 spray into both nostrils 2 (two) times daily.   05/29/2023   furosemide (LASIX) 40 MG tablet Take 1 tablet (40 mg total) by mouth daily. 30 tablet 0 05/29/2023   losartan (COZAAR) 100 MG tablet Take 100 mg by mouth daily.  2 05/29/2023   metoprolol succinate (TOPROL-XL) 25 MG 24 hr tablet Take 1 tablet (25 mg total) by mouth daily. 30 tablet 0 05/29/2023   silodosin (RAPAFLO) 4 MG CAPS capsule Take 1 capsule (4 mg total) by mouth daily with breakfast. 90 capsule 3 05/29/2023   tadalafil (CIALIS) 5 MG tablet Take 1 tablet (5 mg total) by mouth daily. 90 tablet 3 unknown at prn   azelastine (ASTELIN) 0.1 % nasal spray Place 1-2 sprays into both nostrils in the morning and at bedtime. (Patient not taking: Reported on 05/30/2023)   Not Taking   Social History   Socioeconomic History   Marital status: Married    Spouse name: Not on file   Number of children: Not on file   Years of education: Not on  file   Highest education level: Not on file  Occupational History   Not on file  Tobacco Use   Smoking status: Former    Current packs/day: 0.00    Types: Cigarettes    Quit date: 01/10/1980    Years since quitting: 43.4   Smokeless tobacco: Never  Vaping Use   Vaping status: Never Used  Substance and Sexual Activity   Alcohol use: Never   Drug use: Never   Sexual activity: Yes    Birth control/protection: None  Other Topics Concern   Not on file  Social History Narrative   Not on file   Social Determinants of Health   Financial Resource Strain: Not on file  Food Insecurity: No Food Insecurity (05/31/2023)   Hunger Vital Sign    Worried About Running Out of Food in the  Last Year: Never true    Ran Out of Food in the Last Year: Never true  Transportation Needs: No Transportation Needs (05/31/2023)   PRAPARE - Administrator, Civil Service (Medical): No    Lack of Transportation (Non-Medical): No  Physical Activity: Not on file  Stress: Not on file  Social Connections: Not on file  Intimate Partner Violence: Not At Risk (05/31/2023)   Humiliation, Afraid, Rape, and Kick questionnaire    Fear of Current or Ex-Partner: No    Emotionally Abused: No    Physically Abused: No    Sexually Abused: No    Family History  Problem Relation Age of Onset   Heart disease Father    Congestive Heart Failure Father    COPD Mother    Hypertension Mother       Intake/Output Summary (Last 24 hours) at 06/01/2023 1009 Last data filed at 06/01/2023 0659 Gross per 24 hour  Intake 240 ml  Output 300 ml  Net -60 ml    Vitals:   05/31/23 2005 06/01/23 0005 06/01/23 0429 06/01/23 0810  BP: 126/82 122/89 (!) 134/91 124/80  Pulse: 85 80 75 82  Resp: 18 18 18 18   Temp: 98.1 F (36.7 C) 97.6 F (36.4 C) 97.9 F (36.6 C) 98.3 F (36.8 C)  TempSrc:      SpO2: 93% 94% 97% 96%  Weight:      Height:        PHYSICAL EXAM General: pleasant thin elderly caucasian male , well nourished, in no acute distress. Sitting upright in bedside chair eating breakfast HEENT:  Normocephalic and atraumatic. Neck:  No JVD.  Lungs: Normal respiratory effort on room air, trace crackles right base, clear to auscultation on the left.   Heart: HRRR . Normal S1 and S2 without gallops or murmurs.  Abdomen: mildly distended appearing.  Msk: Normal strength and tone for age. Extremities: Warm and well perfused. No clubbing, cyanosis. Trace bilateral LE edema.  Neuro: Alert and oriented X 3. Psych:  Answers questions appropriately.   Labs: Basic Metabolic Panel: Recent Labs    05/31/23 0434 06/01/23 0417  NA 125* 125*  K 3.3* 3.5  CL 92* 92*  CO2 24 23  GLUCOSE 121* 97   BUN 23 21  CREATININE 1.19 1.13  CALCIUM 7.8* 8.2*   Liver Function Tests: Recent Labs    05/30/23 0130  AST 24  ALT 18  ALKPHOS 75  BILITOT 1.1  PROT 7.2  ALBUMIN 4.1   No results for input(s): "LIPASE", "AMYLASE" in the last 72 hours. CBC: Recent Labs    05/30/23 0130 05/31/23 0434  WBC 14.5*  9.6  NEUTROABS 11.9*  --   HGB 13.7 13.1  HCT 40.3 38.8*  MCV 85.4 85.5  PLT 241 218   Cardiac Enzymes: Recent Labs    05/30/23 0130 05/30/23 0348  CKTOTAL  --  96  TROPONINIHS 196* 173*   BNP: Recent Labs    05/30/23 0130  BNP 2,575.3*   D-Dimer: No results for input(s): "DDIMER" in the last 72 hours. Hemoglobin A1C: No results for input(s): "HGBA1C" in the last 72 hours. Fasting Lipid Panel: No results for input(s): "CHOL", "HDL", "LDLCALC", "TRIG", "CHOLHDL", "LDLDIRECT" in the last 72 hours. Thyroid Function Tests: No results for input(s): "TSH", "T4TOTAL", "T3FREE", "THYROIDAB" in the last 72 hours.  Invalid input(s): "FREET3" Anemia Panel: No results for input(s): "VITAMINB12", "FOLATE", "FERRITIN", "TIBC", "IRON", "RETICCTPCT" in the last 72 hours.   Radiology: US THORACENTESIS ASP PLEURAL SPACE W/IMG GUIDE  Result Date: 05/30/2023 INDICATION: Patient with a history of heart failure presents today with pleural effusions. Interventional radiology asked to perform a diagnostic and therapeutic thoracentesis. EXAM: ULTRASOUND GUIDED THORACENTESIS MEDICATIONS: 1% lidocaine 10 mL COMPLICATIONS: None immediate. PROCEDURE: An ultrasound guided thoracentesis was thoroughly discussed with the patient and questions answered. The benefits, risks, alternatives and complications were also discussed. The patient understands and wishes to proceed with the procedure. Written consent was obtained. Ultrasound was performed to localize and mark an adequate pocket of fluid in the right chest. The area was then prepped and draped in the normal sterile fashion. 1% Lidocaine was used  for local anesthesia. Under ultrasound guidance a 6 Fr Safe-T-Centesis catheter was introduced. Thoracentesis was performed. The catheter was removed and a dressing applied. FINDINGS: A total of approximately 1.2 L of clear yellow fluid was removed. Samples were sent to the laboratory as requested by the clinical team. IMPRESSION: Successful ultrasound guided right thoracentesis yielding 1.2 L of pleural fluid. Procedure performed by Alwyn Ren NP and was supervised by Dr. Juliette Alcide. Electronically Signed   By: Olive Bass M.D.   On: 05/30/2023 11:54   DG Chest Port 1 View  Result Date: 05/30/2023 CLINICAL DATA:  142230 Pleural effusion 142230 [id number] Status post thoracentesis [id number]. EXAM: PORTABLE CHEST 1 VIEW COMPARISON:  05/30/2023. FINDINGS: Near-complete resolution of right pleural effusion with improved aeration of the right lung base. No pneumothorax. Unchanged moderate pulmonary edema with slightly increased small left pleural effusion and left basilar atelectasis. Similar cardiomegaly and mediastinal contours. IMPRESSION: 1. Near-complete resolution of right pleural effusion with improved aeration of the right lung base. No pneumothorax. 2. Unchanged moderate pulmonary edema with slightly increased small left pleural effusion and left basilar atelectasis. Electronically Signed   By: Orvan Falconer M.D.   On: 05/30/2023 11:09   DG Chest Port 1 View  Result Date: 05/30/2023 CLINICAL DATA:  Shortness of breath EXAM: PORTABLE CHEST 1 VIEW COMPARISON:  Chest x-ray 05/25/2023 FINDINGS: Cardiomegaly and central pulmonary vascular congestion persist. There is a new small right pleural effusion with right basilar opacities. No pneumothorax or acute fracture. IMPRESSION: 1. Cardiomegaly with central pulmonary vascular congestion. 2. New small right pleural effusion with right basilar atelectasis or infiltrate. Electronically Signed   By: Darliss Cheney M.D.   On: 05/30/2023 02:02   ECHOCARDIOGRAM  COMPLETE  Result Date: 05/25/2023    ECHOCARDIOGRAM REPORT   Patient Name:   Brian Little Date of Exam: 05/25/2023 Medical Rec #:  [id number]       Height:       69.0 in Accession #:    [id number]  Weight:       160.0 lb Date of Birth:  1934-07-21       BSA:          1.879 m Patient Age:    88 years        BP:           152/84 mmHg Patient Gender: M               HR:           71 bpm. Exam Location:  [facility] Procedure: 2D Echo, Cardiac Doppler, Color Doppler, 3D Echo and Strain Analysis Indications:     CHF  History:         Patient has no prior history of Echocardiogram examinations.                  CHF; Risk Factors:Hypertension and Dyslipidemia.  Sonographer:     Mikki Harbor Referring Phys:  [phone] Francoise Schaumann [place] Diagnosing Phys: Marcina Millard MD  Sonographer Comments: Global longitudinal strain was attempted. IMPRESSIONS  1. Left ventricular ejection fraction, by estimation, is 35 to 40%. The left ventricle has moderately decreased function. The left ventricle has no regional wall motion abnormalities. The left ventricular internal cavity size was moderately dilated. Left ventricular diastolic parameters were normal.  2. Right ventricular systolic function is normal. The right ventricular size is normal. There is moderately elevated pulmonary artery systolic pressure.  3. The mitral valve is normal in structure. Moderate to severe mitral valve regurgitation. No evidence of mitral stenosis.  4. The aortic valve is normal in structure. Aortic valve regurgitation is mild to moderate. No aortic stenosis is present.  5. The inferior vena cava is normal in size with greater than 50% respiratory variability, suggesting right atrial pressure of 3 mmHg. FINDINGS  Left Ventricle: Left ventricular ejection fraction, by estimation, is 35 to 40%. The left ventricle has moderately decreased function. The left ventricle has no regional wall motion abnormalities. The left ventricular internal cavity size was  moderately  dilated. There is no left ventricular hypertrophy. Left ventricular diastolic parameters were normal. Right Ventricle: The right ventricular size is normal. No increase in right ventricular wall thickness. Right ventricular systolic function is normal. There is moderately elevated pulmonary artery systolic pressure. The tricuspid regurgitant velocity is 3.05 m/s, and with an assumed right atrial pressure of 15 mmHg, the estimated right ventricular systolic pressure is 52.2 mmHg. Left Atrium: Left atrial size was normal in size. Right Atrium: Right atrial size was normal in size. Pericardium: There is no evidence of pericardial effusion. Mitral Valve: The mitral valve is normal in structure. Moderate to severe mitral valve regurgitation. No evidence of mitral valve stenosis. MV peak gradient, 4.6 mmHg. The mean mitral valve gradient is 1.0 mmHg. Tricuspid Valve: The tricuspid valve is normal in structure. Tricuspid valve regurgitation is mild . No evidence of tricuspid stenosis. Aortic Valve: The aortic valve is normal in structure. Aortic valve regurgitation is mild to moderate. Aortic regurgitation PHT measures 403 msec. No aortic stenosis is present. Aortic valve mean gradient measures 4.0 mmHg. Aortic valve peak gradient measures 7.3 mmHg. Aortic valve area, by VTI measures 2.65 cm. Pulmonic Valve: The pulmonic valve was normal in structure. Pulmonic valve regurgitation is not visualized. No evidence of pulmonic stenosis. Aorta: The aortic root is normal in size and structure. Venous: The inferior vena cava is normal in size with greater than 50% respiratory variability, suggesting right atrial pressure of 3 mmHg. IAS/Shunts: No  atrial level shunt detected by color flow Doppler.  LEFT VENTRICLE PLAX 2D LVIDd:         6.00 cm      Diastology LVIDs:         4.90 cm      LV e' medial:    4.68 cm/s LV PW:         1.00 cm      LV E/e' medial:  22.4 LV IVS:        1.10 cm      LV e' lateral:   4.90 cm/s  LVOT diam:     2.00 cm      LV E/e' lateral: 21.4 LV SV:         78 LV SV Index:   42 LVOT Area:     3.14 cm  LV Volumes (MOD) LV vol d, MOD A2C: 144.0 ml LV vol d, MOD A4C: 134.0 ml LV vol s, MOD A2C: 89.1 ml LV vol s, MOD A4C: 83.4 ml LV SV MOD A2C:     54.9 ml LV SV MOD A4C:     134.0 ml LV SV MOD BP:      54.1 ml RIGHT VENTRICLE RV Basal diam:  4.50 cm RV Mid diam:    3.00 cm RV S prime:     6.85 cm/s TAPSE (M-mode): 1.9 cm LEFT ATRIUM             Index        RIGHT ATRIUM           Index LA diam:        5.10 cm 2.71 cm/m   RA Area:     18.70 cm LA Vol (A2C):   92.8 ml 49.39 ml/m  RA Volume:   59.70 ml  31.77 ml/m LA Vol (A4C):   71.5 ml 38.05 ml/m LA Biplane Vol: 93.9 ml 49.97 ml/m  AORTIC VALVE                    PULMONIC VALVE AV Area (Vmax):    2.54 cm     PV Vmax:       0.83 m/s AV Area (Vmean):   2.34 cm     PV Peak grad:  2.8 mmHg AV Area (VTI):     2.65 cm AV Vmax:           135.00 cm/s AV Vmean:          97.200 cm/s AV VTI:            0.295 m AV Peak Grad:      7.3 mmHg AV Mean Grad:      4.0 mmHg LVOT Vmax:         109.00 cm/s LVOT Vmean:        72.500 cm/s LVOT VTI:          0.249 m LVOT/AV VTI ratio: 0.84 AI PHT:            403 msec  AORTA Ao Root diam: 3.10 cm Ao Asc diam:  3.10 cm MITRAL VALVE                  TRICUSPID VALVE MV Area (PHT): 3.76 cm       TR Peak grad:   37.2 mmHg MV Area VTI:   2.58 cm       TR Vmax:        305.00 cm/s MV Peak grad:  4.6 mmHg MV Mean  grad:  1.0 mmHg       SHUNTS MV Vmax:       1.07 m/s       Systemic VTI:  0.25 m MV Vmean:      53.7 cm/s      Systemic Diam: 2.00 cm MV Decel Time: 202 msec MR Peak grad:    71.6 mmHg MR Mean grad:    49.0 mmHg MR Vmax:         423.00 cm/s MR Vmean:        329.0 cm/s MR PISA:         1.90 cm MR PISA Eff ROA: 42 mm MR PISA Radius:  0.55 cm MV E velocity: 105.00 cm/s MV A velocity: 63.90 cm/s MV E/A ratio:  1.64 Marcina Millard MD Electronically signed by Marcina Millard MD Signature Date/Time: 05/25/2023/2:28:49 PM     Final    DG Chest Portable 1 View  Result Date: 05/25/2023 CLINICAL DATA:  Progressive dyspnea on exertion. EXAM: PORTABLE CHEST 1 VIEW COMPARISON:  CT chest 12/08/2019 FINDINGS: Aortic atherosclerotic calcifications. Mild cardiac enlargement. Trace left pleural effusion. Mild diffuse interstitial edema. No airspace consolidation. Osseous structures appear intact. IMPRESSION: Mild cardiac enlargement with signs of CHF. Electronically Signed   By: Signa Kell M.D.   On: 05/25/2023 06:06    TELEMETRY reviewed by me [facility]) 06/01/2023 : NSR frequent PACs, PVCs rate 80s  EKG reviewed by me: NSR 96, multiform PVC nonspecific TW changes  Data reviewed by me [facility]) 06/01/2023: hospitalist progress note, last 24h vitals tele labs imaging I/O    Principal Problem:   Acute on chronic systolic (congestive) heart failure (HCC) Active Problems:   Essential hypertension   Elevated troponin   Acute on chronic systolic CHF (congestive heart failure) (HCC)   Acute respiratory failure with hypoxia (HCC)   Leukocytosis    ASSESSMENT AND PLAN:  Brian Little is an 66yoM with a PMH of new HFrEF (EF 35-40% 05/25/2023), HTN, PAD with claudication, carotid stenosis, HLD, hx eosinophilic myocarditis, BPH with nocturia with recent admission from 7/5 to 7/6 for acute HFrEF  presented to [facility] ED early morning hours of 05/30/2023 with orthopnea and PND, worsening since hospital discharge.  Cardiology is consulted for further assistance.  # acute on chronic HFrEF  # R pleural effusion  Presents with weight gain, continued orthopnea and PND progressively worsening after recent hospital discharge despite medication and dietary compliance. Bnp 2500, CXR with large R pleural effusion, and an O2 requirement.  S/p thoracentesis yielding 1.2 L of a transudate effusion with clinical improvement, now weaned to room air. -IV Lasix 60 mg this AM. Change home diuretic from Lasix 40 to torsemide 20 mg daily.  - GDMT with  Entresto 24-26 mg twice daily, metoprolol XL 25mg  daily and spironolactone 12.5 mg daily -Consider sglt2i outpatient for optimized GDMT.  -Strict I/O, daily weights  # demand ischemia  Borderline enzyme elevation at 196, 173 without chest pain or EKG changes, which is most consistent with demand/supply mismatch and not ACS. Defer ischemic eval for now, likely as planned on an outpatient basis once volume status optimized.   Ok for discharge today from a cardiac perspective. Will arrange for follow up in clinic with Dr. Darrold Junker in 1-2 weeks.   This patient's plan of care was discussed and created with Dr. Juliann Pares and he is in agreement.  Signed: Gale Journey , PA-C 06/01/2023, 10:09 AM [facility] Cardiology

## 2023-06-01 NOTE — TOC CM/SW Note (Signed)
Transition of Care [facility]) - Inpatient Brief Assessment   Patient Details  Name: Brian Little MRN: [id number] Date of Birth: 10-24-1934  Transition of Care [facility]) CM/SW Contact:    Margarito Liner, LCSW Phone Number: 06/01/2023, 10:14 AM   Clinical Narrative: Patient has orders to discharge home today. Chart reviewed. No TOC needs identified. CSW signing off.  Transition of Care Asessment: Insurance and Status: Insurance coverage has been reviewed Patient has primary care physician: Yes Home environment has been reviewed: Single family home Prior level of function:: Independent Prior/Current Home Services: No current home services Social Determinants of Health Reivew: SDOH reviewed no interventions necessary Readmission risk has been reviewed: Yes Transition of care needs: no transition of care needs at this time

## 2023-06-01 NOTE — Discharge Instructions (Signed)

## 2023-06-01 NOTE — Discharge Summary (Signed)
Physician Discharge Summary   Patient: Brian Little MRN: [id number] DOB: 1934/02/27  Admit date:     05/30/2023  Discharge date: 06/01/23  Discharge Physician: Enedina Finner   PCP: Lynnea Ferrier, MD   Recommendations at discharge:   follow-up cardiology Dr. Darrold Junker and one week. Patient will need metabolic panel to be drawn.  Discharge Diagnoses: Principal Problem:   Acute on chronic systolic (congestive) heart failure (HCC) Active Problems:   Acute on chronic systolic CHF (congestive heart failure) (HCC)   Acute respiratory failure with hypoxia (HCC)   Leukocytosis   Essential hypertension   Elevated troponin Brian Little is a 89 y.o. male with medical history significant for BPH, hypertension, bile acid associated diarrhea, PAD with claudication, remote history of eosinophilic myocarditis, former smoker hospitalized overnight from 7/5 to 05/26/2023 with CHF exacerbation with demand ischemia (troponin49>139>690) with echo showing EF 35 to 40% and severe MR and mild to moderate AR, seen by cardiology with recommendation for outpatient ischemic evaluation, who returns to the ED with persistent shortness of breath and orthopnea.  Denies lower extremity edema.  He denies chest pain, fever or chills.    Chest x-ray showed cardiomegaly with central pulmonary vascular congestion with a new right pleural effusion with right basilar atelectasis or infiltrate    Acute on chronic systolic CHF (congestive heart failure) (HCC) Elevated troponin Severe mitral regurgitation History of eosinophilic myocarditis hyponatremia --New CHF diagnosis on 7/5 with EF 35 to 40% --IV Lasix, metoprolol and losartan --Daily weights with intake and output monitoring --Cardiology reconsulted --Dr Juliann Pares to see pt -pt now on lasix 60 mg IV bid, enteresto, spironolactone, BB --on RA --sodium 124--125--125 --now on po torsemide. Pt will get BMP checked with [facility] cardiology next week. Ok from their end for  d/c   Right Pleural effusion--new --s/p US guided thoracentesis with removal of 1.2 L transudate fluid   Acute respiratory failure with hypoxia (HCC) --Patient had increased work of breathing tachypneic to 25 with O2 sat in the 80s requiring 2 L Secondary to CHF --With leukocytosis and findings on chest x-ray some concern for pneumonia-- clinically does not seem to be pneumonia however will empirically treated for five days with Zithromax. Pro calcitonin less than .01 -Supplemental oxygen to keep sats over 94% --clinically stable. WBC normal--dc abxs   Leukocytosis --WBC 14,000 and chest x-ray showing new right pleural effusion with right basilar atelectasis or infiltrate -- COVID negative -- clinically does not seem to be pneumonia. D/c abxs --wbc 9.0   Essential hypertension Continue metoprolol   ambulated by himself with RN. Sats remain more than 93%.      DVT prophylaxis: Lovenox   Consults: Cardiology Dr. Geraldine Contras   Advance Care Planning:   Code Status: full   Family Communication: discharge plan discussed with son Dr Tiburcio Pea   Procedures: right sided US guided Thoracentesis Level of care: Progressive          Diet recommendation:  Discharge Diet Orders (From admission, onward)     Start     Ordered   06/01/23 0000  Diet - low sodium heart healthy        06/01/23 0957           Cardiac diet DISCHARGE MEDICATION: Allergies as of 06/01/2023       Reactions   Bee Venom Anaphylaxis   Caffeine Other (See Comments)   Other reaction(s): Other Vertigo vertigo   Ibuprofen    Other reaction(s): Other (See Comments), Other (  See Comments) vertigo vertigo   Nsaids Other (See Comments)   Other reaction(s): Other (See Comments) Vertigo and n/v Vertigo and n/v   Trazodone Other (See Comments)   "Dizziness" per pt report        Medication List     STOP taking these medications    azelastine 0.1 % nasal spray Commonly known as: ASTELIN    ciprofloxacin 500 MG tablet Commonly known as: CIPRO   furosemide 40 MG tablet Commonly known as: Lasix   losartan 100 MG tablet Commonly known as: COZAAR       TAKE these medications    acetaminophen 500 MG tablet Commonly known as: TYLENOL Take 500 mg by mouth every 6 (six) hours as needed.   aspirin EC 81 MG tablet Take 1 tablet (81 mg total) by mouth daily. Swallow whole.   cetirizine 10 MG tablet Commonly known as: ZYRTEC 10 mg daily as needed for allergies.   cholestyramine 4 g packet Commonly known as: QUESTRAN Take 4 g by mouth in the morning.   fluticasone 50 MCG/ACT nasal spray Commonly known as: FLONASE Place 1 spray into both nostrils 2 (two) times daily.   metoprolol succinate 25 MG 24 hr tablet Commonly known as: TOPROL-XL Take 1 tablet (25 mg total) by mouth daily.   sacubitril-valsartan 24-26 MG Commonly known as: ENTRESTO Take 1 tablet by mouth 2 (two) times daily.   silodosin 4 MG Caps capsule Commonly known as: RAPAFLO Take 1 capsule (4 mg total) by mouth daily with breakfast.   spironolactone 25 MG tablet Commonly known as: ALDACTONE Take 0.5 tablets (12.5 mg total) by mouth daily. Start taking on: June 02, 2023   tadalafil 5 MG tablet Commonly known as: CIALIS Take 1 tablet (5 mg total) by mouth daily.   torsemide 20 MG tablet Commonly known as: DEMADEX Take 1 tablet (20 mg total) by mouth daily. Start taking on: June 02, 2023        Follow-up Information     Paraschos, Lyn Hollingshead, MD. Go in 1 week(s).   Specialty: Cardiology Contact information: [address] [facility] [place] [address] [phone]         Curtis Sites III, MD. Schedule an appointment as soon as possible for a visit in 1 week(s).   Specialty: Internal Medicine Contact information: [address] [place] [address] [phone]                Discharge Exam: Ceasar Mons Weights   05/30/23 0109 05/31/23  1442  Weight: 76.2 kg 75.8 kg   ENERAL:  89 y.o.-year-old patient with no acute distress.  LUNGS: breath sounds improved, no wheezing CARDIOVASCULAR: S1, S2 normal. No murmur   ABDOMEN: Soft, nontender, nondistended. Bowel sounds present.  EXTREMITIES: No  edema b/l.    NEUROLOGIC: nonfocal  patient is alert and awake SKIN: No obvious rash, lesion, or ulcer.   Condition at discharge: fair  The results of significant diagnostics from this hospitalization (including imaging, microbiology, ancillary and laboratory) are listed below for reference.   Imaging Studies: US THORACENTESIS ASP PLEURAL SPACE W/IMG GUIDE  Result Date: 05/30/2023 INDICATION: Patient with a history of heart failure presents today with pleural effusions. Interventional radiology asked to perform a diagnostic and therapeutic thoracentesis. EXAM: ULTRASOUND GUIDED THORACENTESIS MEDICATIONS: 1% lidocaine 10 mL COMPLICATIONS: None immediate. PROCEDURE: An ultrasound guided thoracentesis was thoroughly discussed with the patient and questions answered. The benefits, risks, alternatives and complications were also discussed. The patient understands  and wishes to proceed with the procedure. Written consent was obtained. Ultrasound was performed to localize and mark an adequate pocket of fluid in the right chest. The area was then prepped and draped in the normal sterile fashion. 1% Lidocaine was used for local anesthesia. Under ultrasound guidance a 6 Fr Safe-T-Centesis catheter was introduced. Thoracentesis was performed. The catheter was removed and a dressing applied. FINDINGS: A total of approximately 1.2 L of clear yellow fluid was removed. Samples were sent to the laboratory as requested by the clinical team. IMPRESSION: Successful ultrasound guided right thoracentesis yielding 1.2 L of pleural fluid. Procedure performed by Alwyn Ren NP and was supervised by Dr. Juliette Alcide. Electronically Signed   By: Olive Bass M.D.   On:  05/30/2023 11:54   DG Chest Port 1 View  Result Date: 05/30/2023 CLINICAL DATA:  142230 Pleural effusion 142230 [id number] Status post thoracentesis [id number]. EXAM: PORTABLE CHEST 1 VIEW COMPARISON:  05/30/2023. FINDINGS: Near-complete resolution of right pleural effusion with improved aeration of the right lung base. No pneumothorax. Unchanged moderate pulmonary edema with slightly increased small left pleural effusion and left basilar atelectasis. Similar cardiomegaly and mediastinal contours. IMPRESSION: 1. Near-complete resolution of right pleural effusion with improved aeration of the right lung base. No pneumothorax. 2. Unchanged moderate pulmonary edema with slightly increased small left pleural effusion and left basilar atelectasis. Electronically Signed   By: Orvan Falconer M.D.   On: 05/30/2023 11:09   DG Chest Port 1 View  Result Date: 05/30/2023 CLINICAL DATA:  Shortness of breath EXAM: PORTABLE CHEST 1 VIEW COMPARISON:  Chest x-ray 05/25/2023 FINDINGS: Cardiomegaly and central pulmonary vascular congestion persist. There is a new small right pleural effusion with right basilar opacities. No pneumothorax or acute fracture. IMPRESSION: 1. Cardiomegaly with central pulmonary vascular congestion. 2. New small right pleural effusion with right basilar atelectasis or infiltrate. Electronically Signed   By: Darliss Cheney M.D.   On: 05/30/2023 02:02   ECHOCARDIOGRAM COMPLETE  Result Date: 05/25/2023    ECHOCARDIOGRAM REPORT   Patient Name:   Brian Little Date of Exam: 05/25/2023 Medical Rec #:  [id number]       Height:       69.0 in Accession #:    [id number]      Weight:       160.0 lb Date of Birth:  10/24/1934       BSA:          1.879 m Patient Age:    88 years        BP:           152/84 mmHg Patient Gender: M               HR:           71 bpm. Exam Location:  [facility] Procedure: 2D Echo, Cardiac Doppler, Color Doppler, 3D Echo and Strain Analysis Indications:     CHF  History:         Patient has no  prior history of Echocardiogram examinations.                  CHF; Risk Factors:Hypertension and Dyslipidemia.  Sonographer:     Mikki Harbor Referring Phys:  [phone] Francoise Schaumann [place] Diagnosing Phys: Marcina Millard MD  Sonographer Comments: Global longitudinal strain was attempted. IMPRESSIONS  1. Left ventricular ejection fraction, by estimation, is 35 to 40%. The left ventricle has moderately decreased function. The left ventricle has no regional wall motion abnormalities. The  left ventricular internal cavity size was moderately dilated. Left ventricular diastolic parameters were normal.  2. Right ventricular systolic function is normal. The right ventricular size is normal. There is moderately elevated pulmonary artery systolic pressure.  3. The mitral valve is normal in structure. Moderate to severe mitral valve regurgitation. No evidence of mitral stenosis.  4. The aortic valve is normal in structure. Aortic valve regurgitation is mild to moderate. No aortic stenosis is present.  5. The inferior vena cava is normal in size with greater than 50% respiratory variability, suggesting right atrial pressure of 3 mmHg. FINDINGS  Left Ventricle: Left ventricular ejection fraction, by estimation, is 35 to 40%. The left ventricle has moderately decreased function. The left ventricle has no regional wall motion abnormalities. The left ventricular internal cavity size was moderately  dilated. There is no left ventricular hypertrophy. Left ventricular diastolic parameters were normal. Right Ventricle: The right ventricular size is normal. No increase in right ventricular wall thickness. Right ventricular systolic function is normal. There is moderately elevated pulmonary artery systolic pressure. The tricuspid regurgitant velocity is 3.05 m/s, and with an assumed right atrial pressure of 15 mmHg, the estimated right ventricular systolic pressure is 52.2 mmHg. Left Atrium: Left atrial size was normal in size. Right  Atrium: Right atrial size was normal in size. Pericardium: There is no evidence of pericardial effusion. Mitral Valve: The mitral valve is normal in structure. Moderate to severe mitral valve regurgitation. No evidence of mitral valve stenosis. MV peak gradient, 4.6 mmHg. The mean mitral valve gradient is 1.0 mmHg. Tricuspid Valve: The tricuspid valve is normal in structure. Tricuspid valve regurgitation is mild . No evidence of tricuspid stenosis. Aortic Valve: The aortic valve is normal in structure. Aortic valve regurgitation is mild to moderate. Aortic regurgitation PHT measures 403 msec. No aortic stenosis is present. Aortic valve mean gradient measures 4.0 mmHg. Aortic valve peak gradient measures 7.3 mmHg. Aortic valve area, by VTI measures 2.65 cm. Pulmonic Valve: The pulmonic valve was normal in structure. Pulmonic valve regurgitation is not visualized. No evidence of pulmonic stenosis. Aorta: The aortic root is normal in size and structure. Venous: The inferior vena cava is normal in size with greater than 50% respiratory variability, suggesting right atrial pressure of 3 mmHg. IAS/Shunts: No atrial level shunt detected by color flow Doppler.  LEFT VENTRICLE PLAX 2D LVIDd:         6.00 cm      Diastology LVIDs:         4.90 cm      LV e' medial:    4.68 cm/s LV PW:         1.00 cm      LV E/e' medial:  22.4 LV IVS:        1.10 cm      LV e' lateral:   4.90 cm/s LVOT diam:     2.00 cm      LV E/e' lateral: 21.4 LV SV:         78 LV SV Index:   42 LVOT Area:     3.14 cm  LV Volumes (MOD) LV vol d, MOD A2C: 144.0 ml LV vol d, MOD A4C: 134.0 ml LV vol s, MOD A2C: 89.1 ml LV vol s, MOD A4C: 83.4 ml LV SV MOD A2C:     54.9 ml LV SV MOD A4C:     134.0 ml LV SV MOD BP:      54.1 ml RIGHT VENTRICLE RV Basal diam:  4.50 cm RV Mid diam:    3.00 cm RV S prime:     6.85 cm/s TAPSE (M-mode): 1.9 cm LEFT ATRIUM             Index        RIGHT ATRIUM           Index LA diam:        5.10 cm 2.71 cm/m   RA Area:     18.70  cm LA Vol (A2C):   92.8 ml 49.39 ml/m  RA Volume:   59.70 ml  31.77 ml/m LA Vol (A4C):   71.5 ml 38.05 ml/m LA Biplane Vol: 93.9 ml 49.97 ml/m  AORTIC VALVE                    PULMONIC VALVE AV Area (Vmax):    2.54 cm     PV Vmax:       0.83 m/s AV Area (Vmean):   2.34 cm     PV Peak grad:  2.8 mmHg AV Area (VTI):     2.65 cm AV Vmax:           135.00 cm/s AV Vmean:          97.200 cm/s AV VTI:            0.295 m AV Peak Grad:      7.3 mmHg AV Mean Grad:      4.0 mmHg LVOT Vmax:         109.00 cm/s LVOT Vmean:        72.500 cm/s LVOT VTI:          0.249 m LVOT/AV VTI ratio: 0.84 AI PHT:            403 msec  AORTA Ao Root diam: 3.10 cm Ao Asc diam:  3.10 cm MITRAL VALVE                  TRICUSPID VALVE MV Area (PHT): 3.76 cm       TR Peak grad:   37.2 mmHg MV Area VTI:   2.58 cm       TR Vmax:        305.00 cm/s MV Peak grad:  4.6 mmHg MV Mean grad:  1.0 mmHg       SHUNTS MV Vmax:       1.07 m/s       Systemic VTI:  0.25 m MV Vmean:      53.7 cm/s      Systemic Diam: 2.00 cm MV Decel Time: 202 msec MR Peak grad:    71.6 mmHg MR Mean grad:    49.0 mmHg MR Vmax:         423.00 cm/s MR Vmean:        329.0 cm/s MR PISA:         1.90 cm MR PISA Eff ROA: 42 mm MR PISA Radius:  0.55 cm MV E velocity: 105.00 cm/s MV A velocity: 63.90 cm/s MV E/A ratio:  1.64 Marcina Millard MD Electronically signed by Marcina Millard MD Signature Date/Time: 05/25/2023/2:28:49 PM    Final    DG Chest Portable 1 View  Result Date: 05/25/2023 CLINICAL DATA:  Progressive dyspnea on exertion. EXAM: PORTABLE CHEST 1 VIEW COMPARISON:  CT chest 12/08/2019 FINDINGS: Aortic atherosclerotic calcifications. Mild cardiac enlargement. Trace left pleural effusion. Mild diffuse interstitial edema. No airspace consolidation. Osseous structures appear intact. IMPRESSION: Mild cardiac enlargement with signs of CHF. Electronically  Signed   By: Signa Kell M.D.   On: 05/25/2023 06:06    Microbiology: Results for orders placed or  performed during the hospital encounter of 05/30/23  Blood Culture (routine x 2)     Status: None (Preliminary result)   Collection Time: 05/30/23  3:15 AM   Specimen: BLOOD  Result Value Ref Range Status   Specimen Description BLOOD BLOOD RIGHT WRIST  Final   Special Requests   Final    BOTTLES DRAWN AEROBIC AND ANAEROBIC Blood Culture adequate volume   Culture   Final    NO GROWTH 2 DAYS Performed at [facility], [address], [place], [place] [address]    Report Status PENDING  Incomplete  Blood Culture (routine x 2)     Status: None (Preliminary result)   Collection Time: 05/30/23  3:15 AM   Specimen: BLOOD  Result Value Ref Range Status   Specimen Description BLOOD WRIST  Final   Special Requests   Final    BOTTLES DRAWN AEROBIC AND ANAEROBIC Blood Culture adequate volume   Culture   Final    NO GROWTH 2 DAYS Performed at [facility], [address] [place], [place] [address]    Report Status PENDING  Incomplete  Resp panel by RT-PCR (RSV, Flu A&B, Covid) Anterior Nasal Swab     Status: None   Collection Time: 05/30/23  3:48 AM   Specimen: Anterior Nasal Swab  Result Value Ref Range Status   SARS Coronavirus 2 by RT PCR NEGATIVE NEGATIVE Final    Comment: (NOTE) SARS-CoV-2 target nucleic acids are NOT DETECTED.  The SARS-CoV-2 RNA is generally detectable in upper respiratory specimens during the acute phase of infection. The lowest concentration of SARS-CoV-2 viral copies this assay can detect is 138 copies/mL. A negative result does not preclude SARS-Cov-2 infection and should not be used as the sole basis for treatment or other patient management decisions. A negative result may occur with  improper specimen collection/handling, submission of specimen other than nasopharyngeal swab, presence of viral mutation(s) within the areas targeted by this assay, and inadequate number of viral copies(<138 copies/mL). A negative result must be combined  with clinical observations, patient history, and epidemiological information. The expected result is Negative.  Fact Sheet for Patients:  BloggerCourse.com  Fact Sheet for Healthcare Providers:  SeriousBroker.it  This test is no t yet approved or cleared by the [place] FDA and  has been authorized for detection and/or diagnosis of SARS-CoV-2 by FDA under an Emergency Use Authorization (EUA). This EUA will remain  in effect (meaning this test can be used) for the duration of the COVID-19 declaration under Section 564(b)(1) of the Act, 21 U.S.C.section 360bbb-3(b)(1), unless the authorization is terminated  or revoked sooner.       Influenza A by PCR NEGATIVE NEGATIVE Final   Influenza B by PCR NEGATIVE NEGATIVE Final    Comment: (NOTE) The Xpert Xpress SARS-CoV-2/FLU/RSV plus assay is intended as an aid in the diagnosis of influenza from Nasopharyngeal swab specimens and should not be used as a sole basis for treatment. Nasal washings and aspirates are unacceptable for Xpert Xpress SARS-CoV-2/FLU/RSV testing.  Fact Sheet for Patients: BloggerCourse.com  Fact Sheet for Healthcare Providers: SeriousBroker.it  This test is not yet approved or cleared by the [place] FDA and has been authorized for detection and/or diagnosis of SARS-CoV-2 by FDA under an Emergency Use Authorization (EUA). This EUA will remain in effect (meaning this test can be used)  for the duration of the COVID-19 declaration under Section 564(b)(1) of the Act, 21 U.S.C. section 360bbb-3(b)(1), unless the authorization is terminated or revoked.     Resp Syncytial Virus by PCR NEGATIVE NEGATIVE Final    Comment: (NOTE) Fact Sheet for Patients: BloggerCourse.com  Fact Sheet for Healthcare Providers: SeriousBroker.it  This test is not yet approved  or cleared by the [place] FDA and has been authorized for detection and/or diagnosis of SARS-CoV-2 by FDA under an Emergency Use Authorization (EUA). This EUA will remain in effect (meaning this test can be used) for the duration of the COVID-19 declaration under Section 564(b)(1) of the Act, 21 U.S.C. section 360bbb-3(b)(1), unless the authorization is terminated or revoked.  Performed at [facility], [address] [place], [place] [address]     Labs: CBC: Recent Labs  Lab 05/30/23 0130 05/31/23 0434  WBC 14.5* 9.6  NEUTROABS 11.9*  --   HGB 13.7 13.1  HCT 40.3 38.8*  MCV 85.4 85.5  PLT 241 218   Basic Metabolic Panel: Recent Labs  Lab 05/25/23 1239 05/25/23 1621 05/30/23 0130 05/31/23 0434 06/01/23 0417  NA 129* 133* 124* 125* 125*  K 3.6 4.3 4.1 3.3* 3.5  CL 97* 97* 91* 92* 92*  CO2 24 26 21* 24 23  GLUCOSE 94 107* 108* 121* 97  BUN 13 14 27* 23 21  CREATININE 0.89 1.10 1.16 1.19 1.13  CALCIUM 8.5* 9.2 8.7* 7.8* 8.2*   Liver Function Tests: Recent Labs  Lab 05/30/23 0130  AST 24  ALT 18  ALKPHOS 75  BILITOT 1.1  PROT 7.2  ALBUMIN 4.1   CBG: No results for input(s): "GLUCAP" in the last 168 hours.  Discharge time spent: greater than 30 minutes.  Signed: Enedina Finner, MD [facility] 06/01/2023

## 2023-06-01 NOTE — Care Management Important Message (Signed)
Important Message  Patient Details  Name: Brian Little MRN: [id number] Date of Birth: 1934/06/06   Medicare Important Message Given:  N/A - LOS <3 / Initial given by admissions     Johnell Comings 06/01/2023, 8:27 AM

## 2023-06-04 ENCOUNTER — Ambulatory Visit: Payer: Medicare Other | Attending: Family | Admitting: Family

## 2023-06-04 ENCOUNTER — Encounter: Payer: Self-pay | Admitting: Family

## 2023-06-04 VITALS — BP 122/70 | HR 72 | Wt 161.6 lb

## 2023-06-04 DIAGNOSIS — I5022 Chronic systolic (congestive) heart failure: Secondary | ICD-10-CM | POA: Insufficient documentation

## 2023-06-04 DIAGNOSIS — I10 Essential (primary) hypertension: Secondary | ICD-10-CM

## 2023-06-04 DIAGNOSIS — J90 Pleural effusion, not elsewhere classified: Secondary | ICD-10-CM | POA: Diagnosis not present

## 2023-06-04 DIAGNOSIS — I471 Supraventricular tachycardia, unspecified: Secondary | ICD-10-CM | POA: Insufficient documentation

## 2023-06-04 DIAGNOSIS — I110 Hypertensive heart disease with heart failure: Secondary | ICD-10-CM | POA: Insufficient documentation

## 2023-06-04 DIAGNOSIS — E785 Hyperlipidemia, unspecified: Secondary | ICD-10-CM | POA: Insufficient documentation

## 2023-06-04 DIAGNOSIS — R5383 Other fatigue: Secondary | ICD-10-CM | POA: Insufficient documentation

## 2023-06-04 DIAGNOSIS — Z79899 Other long term (current) drug therapy: Secondary | ICD-10-CM | POA: Diagnosis not present

## 2023-06-04 DIAGNOSIS — I3139 Other pericardial effusion (noninflammatory): Secondary | ICD-10-CM | POA: Insufficient documentation

## 2023-06-04 DIAGNOSIS — I739 Peripheral vascular disease, unspecified: Secondary | ICD-10-CM | POA: Insufficient documentation

## 2023-06-04 DIAGNOSIS — Z87891 Personal history of nicotine dependence: Secondary | ICD-10-CM | POA: Insufficient documentation

## 2023-06-04 LAB — CULTURE, BLOOD (ROUTINE X 2)
Culture: NO GROWTH
Culture: NO GROWTH
Special Requests: ADEQUATE
Special Requests: ADEQUATE

## 2023-06-04 NOTE — Progress Notes (Signed)
Advanced Heart Failure Clinic Note   Referring Physician: PCP: Brian Ferrier, MD (last seen 05/24) PCP-Cardiologist: Brian Millard, MD (last seen 07/24)  HPI:  Mr Brian Little is a 89 y/ o male with a history of BPH, hyperlipidemia, SVT, hypertension, bile acid associated diarrhea, PAD with claudication, remote history of eosinophilic myocarditis, bilateral carotid stenosis, previous tobacco use and chronic heart failure.   Admitted 05/25/23 due to progressive worsening shortness of breath over the past 2 weeks. Chest x-ray with CHF pattern. Echo shows EF 35-40%, normal diastolic function, mod-severe MR, mild-mod AR. Elevated troponins: likely secondary to demand ischemia in setting of CHF exacerbation/volume overload. Admitted 05/30/23 due to persistent shortness of breath and orthopnea. Chest x-ray showed cardiomegaly with central pulmonary vascular congestion with a new right pleural effusion with right basilar atelectasis or infiltrate. IV diuresed with transition to oral diuretics. s/p US guided thoracentesis with removal of 1.2 L transudate fluid. Supplemental oxygen to keep sats over 94%.   Echo 12/29/21: EF 50% with mild MR.  Echo 05/25/23: EF 35-40% along with moderately elevated PA pressure and moderate/ severe MR.   cMRI 07/17/2019:  1. The left ventricle is normal in cavity size.  There is moderate basal sigmoid septal hypertrophy.  Regional and global LV systolic function are normal.   The LVEF is calculated at 61%.   2. The right ventricle is normal in cavity size, wall thickness, and systolic function.  3. Both atria are normal in size.  4. The aortic valve is trileaflet in morphology. There is mild aortic regurgitation.   5.  There is trivial mitral regurgitation, mild tricuspid regurgitation, and trivial pulmonic  regurgitation.  6. Delayed enhancement imaging for viability is abnormal.    a.  There is patchy hyperenhancement involving the mid-distal anterolateral and  involving the  anterolateral papillary muscle.  b.  There is patchy hyperenhancement involving the basal lateral wall.  c.  There is focal mid-myocardial hyperenhancement involving the basal anteroseptum and basal  inferoseptum.  d. There is patchy hyperenhancement involving the basal septum.  The pattern of hyperenhancement is non-specific, but is suggestive of a non-ischemic infiltrative process.  Although not entirely typical, the differential diagnosis include eosinophilic myocarditis and cardiac  amyloidosis, or possibly hypertrophic cardiomyopathy.  7. Adenosine stress perfusion imaging demonstrates no evidence of inducible myocardial ischemia.   8.  The pericardium is normal in thickness.  There is a small pericardial effusion, largest adjacent to the  RV apex.   He presents today for his initial visit with a chief complaint of minimal fatigue with moderate exertion. Chronic in nature. Has no other symptoms and specifically denies SOB, chest pain, palpitations, pedal edema, abdominal distention, dizziness, difficulty sleeping or weight gain. Does voice concern about his frequent urination since he's been home for the last few days. In reviewing his paperwork, he was supposed to be taking 12.5mg  spironolactone and he's been taking 25mg  daily instead.   Review of Systems: [y] = yes, [ ]  = no   General: Weight gain [ ] ; Weight loss [ ] ; Anorexia [ ] ; Fatigue [place] ]; Fever [ ] ; Chills [ ] ; Weakness [ ]   Cardiac: Chest pain/pressure [ ] ; Resting SOB [ ] ; Exertional SOB [ ] ; Orthopnea [ ] ; Pedal Edema [ ] ; Palpitations [ ] ; Syncope [ ] ; Presyncope [ ] ; Paroxysmal nocturnal dyspnea[ ]   Pulmonary: Cough [ ] ; Wheezing[ ] ; Hemoptysis[ ] ; Sputum [ ] ; Snoring [ ]   GI: Vomiting[ ] ; Dysphagia[ ] ; Melena[ ] ; Hematochezia [ ] ;  Heartburn[ ] ; Abdominal pain [ ] ; Constipation [ ] ; Diarrhea [ ] ; BRBPR [ ]   GU: Hematuria[ ] ; Dysuria [ ] ; Nocturia[ ]   Vascular: Pain in legs with walking [ ] ; Pain in feet with  lying flat [ ] ; Non-healing sores [ ] ; Stroke [ ] ; TIA [ ] ; Slurred speech [ ] ;  Neuro: Headaches[ ] ; Vertigo[ ] ; Seizures[ ] ; Paresthesias[ ] ;Blurred vision [ ] ; Diplopia [ ] ; Vision changes [ ]   Ortho/Skin: Arthritis [ ] ; Joint pain [ ] ; Muscle pain [ ] ; Joint swelling [ ] ; Back Pain [ ] ; Rash [ ]   Psych: Depression[ ] ; Anxiety[ ]   Heme: Bleeding problems [ ] ; Clotting disorders [ ] ; Anemia [ ]   Endocrine: Diabetes [ ] ; Thyroid dysfunction[ ]    Past Medical History:  Diagnosis Date   BPH (benign prostatic hyperplasia)    Cancer (HCC)    skin   Hypertension     Current Outpatient Medications  Medication Sig Dispense Refill   acetaminophen (TYLENOL) 500 MG tablet Take 500 mg by mouth every 6 (six) hours as needed.     aspirin EC 81 MG tablet Take 1 tablet (81 mg total) by mouth daily. Swallow whole. 30 tablet 12   cetirizine (ZYRTEC) 10 MG tablet 10 mg daily as needed for allergies.     cholestyramine (QUESTRAN) 4 g packet Take 4 g by mouth in the morning.     fluticasone (FLONASE) 50 MCG/ACT nasal spray Place 1 spray into both nostrils 2 (two) times daily.     metoprolol succinate (TOPROL-XL) 25 MG 24 hr tablet Take 1 tablet (25 mg total) by mouth daily. 30 tablet 0   sacubitril-valsartan (ENTRESTO) 24-26 MG Take 1 tablet by mouth 2 (two) times daily. 60 tablet 1   silodosin (RAPAFLO) 4 MG CAPS capsule Take 1 capsule (4 mg total) by mouth daily with breakfast. 90 capsule 3   spironolactone (ALDACTONE) 25 MG tablet Take 0.5 tablets (12.5 mg total) by mouth daily. 30 tablet 1   tadalafil (CIALIS) 5 MG tablet Take 1 tablet (5 mg total) by mouth daily. 90 tablet 3   torsemide (DEMADEX) 20 MG tablet Take 1 tablet (20 mg total) by mouth daily. 30 tablet 1   No current facility-administered medications for this visit.    Allergies  Allergen Reactions   Bee Venom Anaphylaxis   Caffeine Other (See Comments)    Other reaction(s): Other Vertigo vertigo    Ibuprofen     Other  reaction(s): Other (See Comments), Other (See Comments) vertigo vertigo    Nsaids Other (See Comments)    Other reaction(s): Other (See Comments) Vertigo and n/v Vertigo and n/v    Trazodone Other (See Comments)    "Dizziness" per pt report      Social History   Socioeconomic History   Marital status: Married    Spouse name: Not on file   Number of children: Not on file   Years of education: Not on file   Highest education level: Not on file  Occupational History   Not on file  Tobacco Use   Smoking status: Former    Current packs/day: 0.00    Types: Cigarettes    Quit date: 01/10/1980    Years since quitting: 43.4   Smokeless tobacco: Never  Vaping Use   Vaping status: Never Used  Substance and Sexual Activity   Alcohol use: Never   Drug use: Never   Sexual activity: Yes    Birth control/protection: None  Other Topics Concern  Not on file  Social History Narrative   Not on file   Social Determinants of Health   Financial Resource Strain: Not on file  Food Insecurity: No Food Insecurity (05/31/2023)   Hunger Vital Sign    Worried About Running Out of Food in the Last Year: Never true    Ran Out of Food in the Last Year: Never true  Transportation Needs: No Transportation Needs (05/31/2023)   PRAPARE - Administrator, Civil Service (Medical): No    Lack of Transportation (Non-Medical): No  Physical Activity: Not on file  Stress: Not on file  Social Connections: Not on file  Intimate Partner Violence: Not At Risk (05/31/2023)   Humiliation, Afraid, Rape, and Kick questionnaire    Fear of Current or Ex-Partner: No    Emotionally Abused: No    Physically Abused: No    Sexually Abused: No      Family History  Problem Relation Age of Onset   Heart disease Father    Congestive Heart Failure Father    COPD Mother    Hypertension Mother    Vitals:   06/04/23 1335  BP: 122/70  Pulse: 72  SpO2: 97%  Weight: 161 lb 9.6 oz (73.3 kg)   Wt  Readings from Last 3 Encounters:  06/04/23 161 lb 9.6 oz (73.3 kg)  05/31/23 167 lb 3.2 oz (75.8 kg)  05/25/23 165 lb 12.6 oz (75.2 kg)   Lab Results  Component Value Date   CREATININE 1.13 06/01/2023   CREATININE 1.19 05/31/2023   CREATININE 1.16 05/30/2023   PHYSICAL EXAM: General:  Well appearing. No respiratory difficulty HEENT: normal Neck: supple. no JVD. No lymphadenopathy or thyromegaly appreciated. Cor: PMI nondisplaced. Regular rate & rhythm. No rubs, gallops or murmurs. Lungs: clear Abdomen: soft, nontender, nondistended. No hepatosplenomegaly. No bruits or masses. Extremities: no cyanosis, clubbing, rash, edema Neuro: alert & oriented x 3, cranial nerves grossly intact. moves all 4 extremities w/o difficulty. Affect pleasant.  ECG: not done   ASSESSMENT & PLAN:  1: Chronic heart failure with reduced ejection fraction- - unknown of heart failure cause at this time; cardiology plans on outpatient workup - NYHA class II - euvolemic - weighing daily; reminded to call for an overnight weight gain of > 2 pounds or a weekly weight gain of > 5 pounds - Echo 12/29/21: EF 50% with mild MR.  - Echo 05/25/23: EF 35-40% along with moderately elevated PA pressure and moderate/ severe MR.  - cMRI 07/17/2019:  1. The left ventricle is normal in cavity size.  There is moderate basal sigmoid septal hypertrophy.  Regional and global LV systolic function are normal.   The LVEF is calculated at 61%.   2. The right ventricle is normal in cavity size, wall thickness, and systolic function.  3. Both atria are normal in size.  4. The aortic valve is trileaflet in morphology. There is mild aortic regurgitation.   5.  There is trivial mitral regurgitation, mild tricuspid regurgitation, and trivial pulmonic  regurgitation.  6. Delayed enhancement imaging for viability is abnormal.    a.  There is patchy hyperenhancement involving the mid-distal anterolateral and involving the  anterolateral  papillary muscle.  b.  There is patchy hyperenhancement involving the basal lateral wall.  c.  There is focal mid-myocardial hyperenhancement involving the basal anteroseptum and basal  inferoseptum.  d. There is patchy hyperenhancement involving the basal septum.  The pattern of hyperenhancement is non-specific, but is suggestive of a  non-ischemic infiltrative process.  Although not entirely typical, the differential diagnosis include eosinophilic myocarditis and cardiac  amyloidosis, or possibly hypertrophic cardiomyopathy.  7. Adenosine stress perfusion imaging demonstrates no evidence of inducible myocardial ischemia.   8.  The pericardium is normal in thickness.  There is a small pericardial effusion, largest adjacent to the  RV apex.  - previously was eating out often but now his wife is reading food labels closely trying to keep his daily sodium intake to 2000mg ; using NoSalt for seasoning and occasionally Mrs. Dash for seasoning - continue metoprolol succinate 25mg  daily - continue entresto 24/26mg  BID - continue spironolactone 25mg  daily (since he's been taking this dose since discharge 3 days ago) - instead of taking torsemide daily, change this to 20mg  PRN  - consider SGLT2 but he would like to wait on any other med changes - BNP 05/30/23 was 2575.3  2: HTN- - BP 122/70 - saw PCP Graciela Husbands) 05/24 - BMP 06/01/23 showed sodium 125, potassium 3.5, creatinine 1.13 & GFR >60  3: PAD with claudication- - follows with Dr Myra Gianotti in [place] - ABI's 03/05/23 revealed mild right lower extremity arterial disease and moderate left lower extremity arterial disease  - Carotid ultrasound 03/05/2023 revealed right ICA 60-79%, left ICA 1-39%.   4: SVT- - saw cardiology (Paraschos) 07/24; returns 06/13/23; BMP can be done at this visit - continue metoprolol succinate 25mg  daily  Return in 6 weeks, sooner if needed.   Delma Freeze, FNP 06/04/23

## 2023-06-04 NOTE — Patient Instructions (Signed)
Continue the spironolactone as 1 tablet daily.   Take your torsemide if you need it for weight gain, swelling or shortness of breath.

## 2023-06-13 ENCOUNTER — Other Ambulatory Visit
Admission: RE | Admit: 2023-06-13 | Discharge: 2023-06-13 | Disposition: A | Discharge status: Home / Self Care | Payer: Medicare Other | Attending: Nurse Practitioner | Admitting: Nurse Practitioner

## 2023-06-13 DIAGNOSIS — I5021 Acute systolic (congestive) heart failure: Secondary | ICD-10-CM | POA: Insufficient documentation

## 2023-06-13 LAB — BRAIN NATRIURETIC PEPTIDE: B Natriuretic Peptide: >4500 pg/mL — ABNORMAL HIGH (ref 0.0–100.0)

## 2023-06-21 ENCOUNTER — Other Ambulatory Visit
Admission: RE | Admit: 2023-06-21 | Discharge: 2023-06-21 | Disposition: A | Discharge status: Home / Self Care | Payer: Medicare Other | Source: Ambulatory Visit | Attending: Nurse Practitioner | Admitting: Nurse Practitioner

## 2023-06-21 ENCOUNTER — Telehealth: Payer: Self-pay

## 2023-06-21 DIAGNOSIS — I5021 Acute systolic (congestive) heart failure: Secondary | ICD-10-CM | POA: Diagnosis present

## 2023-06-21 LAB — BRAIN NATRIURETIC PEPTIDE: B Natriuretic Peptide: 2183.3 pg/mL — ABNORMAL HIGH (ref 0.0–100.0)

## 2023-06-21 NOTE — Telephone Encounter (Signed)
Returned call from [employer] requesting suggestions and advice for following a low sodium, low potassium diet.  Discussed with pt's spouse how to look for low sodium food, foods naturally low in sodium, and high sodium foods to avoid. Reviewed reading nutrition labels, and how she and her husband can review the nutrition facts on the internet for their favorite restaurants.  Informed patient this office can mail our in-office sodium and potassium handouts to pt's home.  Pt's spouse agreed. She verbalized understanding and appreciation.  Handouts mailed to patient's home address on file.

## 2023-07-11 ENCOUNTER — Encounter: Payer: Self-pay | Admitting: Urology

## 2023-07-16 ENCOUNTER — Ambulatory Visit: Payer: Medicare Other | Attending: Family | Admitting: Family

## 2023-07-16 ENCOUNTER — Encounter: Payer: Self-pay | Admitting: Pharmacist

## 2023-07-16 ENCOUNTER — Encounter: Payer: Self-pay | Admitting: Family

## 2023-07-16 VITALS — BP 101/59 | HR 60 | Resp 14 | Wt 158.4 lb

## 2023-07-16 DIAGNOSIS — I471 Supraventricular tachycardia, unspecified: Secondary | ICD-10-CM | POA: Diagnosis not present

## 2023-07-16 DIAGNOSIS — Z79899 Other long term (current) drug therapy: Secondary | ICD-10-CM | POA: Insufficient documentation

## 2023-07-16 DIAGNOSIS — N400 Enlarged prostate without lower urinary tract symptoms: Secondary | ICD-10-CM | POA: Insufficient documentation

## 2023-07-16 DIAGNOSIS — Z87891 Personal history of nicotine dependence: Secondary | ICD-10-CM | POA: Diagnosis not present

## 2023-07-16 DIAGNOSIS — I10 Essential (primary) hypertension: Secondary | ICD-10-CM | POA: Diagnosis not present

## 2023-07-16 DIAGNOSIS — I739 Peripheral vascular disease, unspecified: Secondary | ICD-10-CM | POA: Insufficient documentation

## 2023-07-16 DIAGNOSIS — I110 Hypertensive heart disease with heart failure: Secondary | ICD-10-CM | POA: Diagnosis not present

## 2023-07-16 DIAGNOSIS — I6523 Occlusion and stenosis of bilateral carotid arteries: Secondary | ICD-10-CM | POA: Diagnosis not present

## 2023-07-16 DIAGNOSIS — I082 Rheumatic disorders of both aortic and tricuspid valves: Secondary | ICD-10-CM | POA: Diagnosis not present

## 2023-07-16 DIAGNOSIS — R5383 Other fatigue: Secondary | ICD-10-CM | POA: Diagnosis not present

## 2023-07-16 DIAGNOSIS — E785 Hyperlipidemia, unspecified: Secondary | ICD-10-CM | POA: Diagnosis not present

## 2023-07-16 DIAGNOSIS — I5022 Chronic systolic (congestive) heart failure: Secondary | ICD-10-CM | POA: Diagnosis present

## 2023-07-16 DIAGNOSIS — I3139 Other pericardial effusion (noninflammatory): Secondary | ICD-10-CM | POA: Insufficient documentation

## 2023-07-16 NOTE — Patient Instructions (Signed)
Call us in the future if you need us for anything 

## 2023-07-16 NOTE — Progress Notes (Signed)
Advanced Heart Failure Clinic Note   PCP: Lynnea Ferrier, MD (last seen 05/24) Cardiologist: Marcina Millard, MD (last seen 08/24)  HPI:  Mr Coello is a 89 y/ o male with a history of BPH, hyperlipidemia, SVT, hypertension, bile acid associated diarrhea, PAD with claudication, remote history of eosinophilic myocarditis, bilateral carotid stenosis, previous tobacco use and chronic heart failure.   Admitted 05/25/23 due to progressive worsening shortness of breath over the past 2 weeks. Chest x-ray with CHF pattern. Echo shows EF 35-40%, normal diastolic function, mod-severe MR, mild-mod AR. Elevated troponins: likely secondary to demand ischemia in setting of CHF exacerbation/volume overload. Admitted 05/30/23 due to persistent shortness of breath and orthopnea. Chest x-ray showed cardiomegaly with central pulmonary vascular congestion with a new right pleural effusion with right basilar atelectasis or infiltrate. IV diuresed with transition to oral diuretics. s/p US guided thoracentesis with removal of 1.2 L transudate fluid. Supplemental oxygen to keep sats over 94%.   Echo 12/29/21: EF 50% with mild MR.  Echo 05/25/23: EF 35-40% along with moderately elevated PA pressure and moderate/ severe MR.   cMRI 07/17/2019:  1. The left ventricle is normal in cavity size.  There is moderate basal sigmoid septal hypertrophy.  Regional and global LV systolic function are normal.   The LVEF is calculated at 61%.   2. The right ventricle is normal in cavity size, wall thickness, and systolic function.  3. Both atria are normal in size.  4. The aortic valve is trileaflet in morphology. There is mild aortic regurgitation.   5.  There is trivial mitral regurgitation, mild tricuspid regurgitation, and trivial pulmonic  regurgitation.  6. Delayed enhancement imaging for viability is abnormal.    a.  There is patchy hyperenhancement involving the mid-distal anterolateral and involving the  anterolateral  papillary muscle.  b.  There is patchy hyperenhancement involving the basal lateral wall.  c.  There is focal mid-myocardial hyperenhancement involving the basal anteroseptum and basal  inferoseptum.  d. There is patchy hyperenhancement involving the basal septum.  The pattern of hyperenhancement is non-specific, but is suggestive of a non-ischemic infiltrative process.  Although not entirely typical, the differential diagnosis include eosinophilic myocarditis and cardiac  amyloidosis, or possibly hypertrophic cardiomyopathy.  7. Adenosine stress perfusion imaging demonstrates no evidence of inducible myocardial ischemia.   8.  The pericardium is normal in thickness.  There is a small pericardial effusion, largest adjacent to the  RV apex.   He presents today for a HF f/u visit with a chief complaint of minimal fatigue with moderate exertion. Chronic in nature although he reports that he feels like his energy level is improving with the medication changes that his cardiologist has made. Reports sleeping better as well. Denies any other symptoms and specifically denies chest pain, cough, palpitations, abdominal distention, pedal edema, dizziness or weight gain.   Metoprolol and torsemide were both decreased by cardiology due to hypotension.   ROS: All systems negative except as listed in HPI, PMH and Problem List.  SH:  Social History   Socioeconomic History   Marital status: Married    Spouse name: Not on file   Number of children: Not on file   Years of education: Not on file   Highest education level: Not on file  Occupational History   Not on file  Tobacco Use   Smoking status: Former    Current packs/day: 0.00    Types: Cigarettes    Quit date: 01/10/1980  Years since quitting: 43.5   Smokeless tobacco: Never  Vaping Use   Vaping status: Never Used  Substance and Sexual Activity   Alcohol use: Never   Drug use: Never   Sexual activity: Yes    Birth control/protection:  None  Other Topics Concern   Not on file  Social History Narrative   Not on file   Social Determinants of Health   Financial Resource Strain: Not on file  Food Insecurity: No Food Insecurity (05/31/2023)   Hunger Vital Sign    Worried About Running Out of Food in the Last Year: Never true    Ran Out of Food in the Last Year: Never true  Transportation Needs: No Transportation Needs (05/31/2023)   PRAPARE - Administrator, Civil Service (Medical): No    Lack of Transportation (Non-Medical): No  Physical Activity: Not on file  Stress: Not on file  Social Connections: Not on file  Intimate Partner Violence: Not At Risk (05/31/2023)   Humiliation, Afraid, Rape, and Kick questionnaire    Fear of Current or Ex-Partner: No    Emotionally Abused: No    Physically Abused: No    Sexually Abused: No    FH:  Family History  Problem Relation Age of Onset   Heart disease Father    Congestive Heart Failure Father    COPD Mother    Hypertension Mother     Past Medical History:  Diagnosis Date   BPH (benign prostatic hyperplasia)    Cancer (HCC)    skin   Hypertension     Current Outpatient Medications  Medication Sig Dispense Refill   acetaminophen (TYLENOL) 500 MG tablet Take 500 mg by mouth every 6 (six) hours as needed.     aspirin EC 81 MG tablet Take 1 tablet (81 mg total) by mouth daily. Swallow whole. 30 tablet 12   cetirizine (ZYRTEC) 10 MG tablet 10 mg daily as needed for allergies.     cholestyramine (QUESTRAN) 4 g packet Take 4 g by mouth in the morning.     fluticasone (FLONASE) 50 MCG/ACT nasal spray Place 1 spray into both nostrils 2 (two) times daily.     metoprolol succinate (TOPROL-XL) 25 MG 24 hr tablet Take 1 tablet (25 mg total) by mouth daily. 30 tablet 0   sacubitril-valsartan (ENTRESTO) 24-26 MG Take 1 tablet by mouth 2 (two) times daily. 60 tablet 1   silodosin (RAPAFLO) 4 MG CAPS capsule Take 1 capsule (4 mg total) by mouth daily with breakfast.  90 capsule 3   spironolactone (ALDACTONE) 25 MG tablet Take 0.5 tablets (12.5 mg total) by mouth daily. (Patient taking differently: Take 25 mg by mouth daily.) 30 tablet 1   tadalafil (CIALIS) 5 MG tablet Take 1 tablet (5 mg total) by mouth daily. 90 tablet 3   torsemide (DEMADEX) 20 MG tablet Take 1 tablet (20 mg total) by mouth daily. (Patient taking differently: Take 20 mg by mouth as needed.) 30 tablet 1   No current facility-administered medications for this visit.   Vitals:   07/16/23 1110  BP: (!) 101/59  Pulse: 60  Resp: 14  SpO2: 100%  Weight: 158 lb 6 oz (71.8 kg)   Wt Readings from Last 3 Encounters:  07/16/23 158 lb 6 oz (71.8 kg)  06/04/23 161 lb 9.6 oz (73.3 kg)  05/31/23 167 lb 3.2 oz (75.8 kg)   Lab Results  Component Value Date   CREATININE 1.13 06/01/2023   CREATININE 1.19 05/31/2023  CREATININE 1.16 05/30/2023   PHYSICAL EXAM:  General:  Well appearing. No resp difficulty HEENT: normal Neck: supple. JVP flat. No lymphadenopathy or thryomegaly appreciated. Cor: PMI normal. Regular rate & rhythm. No rubs, gallops or murmurs. Lungs: clear Abdomen: soft, nontender, nondistended. No hepatosplenomegaly. No bruits or masses. Extremities: no cyanosis, clubbing, rash, edema Neuro: alert & orientedx3, cranial nerves grossly intact. Moves all 4 extremities w/o difficulty. Affect pleasant.   ECG: not done   ASSESSMENT & PLAN:  1: Chronic heart failure with reduced ejection fraction- - unknown of heart failure cause at this time; cardiology plans on outpatient workup - NYHA class II - euvolemic - weighing daily; reminded to call for an overnight weight gain of > 2 pounds or a weekly weight gain of > 5 pounds - weight down 3 pounds from last visit here 6 weeks ago - Echo 12/29/21: EF 50% with mild MR.  - Echo 05/25/23: EF 35-40% along with moderately elevated PA pressure and moderate/ severe MR.  - cMRI 07/17/2019:  1. The left ventricle is normal in cavity size.   There is moderate basal sigmoid septal hypertrophy.  Regional and global LV systolic function are normal.   The LVEF is calculated at 61%.   2. The right ventricle is normal in cavity size, wall thickness, and systolic function.  3. Both atria are normal in size.  4. The aortic valve is trileaflet in morphology. There is mild aortic regurgitation.   5.  There is trivial mitral regurgitation, mild tricuspid regurgitation, and trivial pulmonic  regurgitation.  6. Delayed enhancement imaging for viability is abnormal.    a.  There is patchy hyperenhancement involving the mid-distal anterolateral and involving the  anterolateral papillary muscle.  b.  There is patchy hyperenhancement involving the basal lateral wall.  c.  There is focal mid-myocardial hyperenhancement involving the basal anteroseptum and basal  inferoseptum.  d. There is patchy hyperenhancement involving the basal septum.  The pattern of hyperenhancement is non-specific, but is suggestive of a non-ischemic infiltrative process.  Although not entirely typical, the differential diagnosis include eosinophilic myocarditis and cardiac  amyloidosis, or possibly hypertrophic cardiomyopathy.  7. Adenosine stress perfusion imaging demonstrates no evidence of inducible myocardial ischemia.   8.  The pericardium is normal in thickness.  There is a small pericardial effusion, largest adjacent to the  RV apex.  - previously was eating out often but now his wife is reading food labels closely trying to keep his daily sodium intake to 2000mg ; using NoSalt for seasoning and occasionally Mrs. Dash for seasoning - continue metoprolol succinate 12.5mg  daily - continue losartan 12.5mg  daily (changed from entresto due to BP) - continue spironolactone 25mg  daily - continue torsemide 30mg  daily PRN - BNP 05/30/23 was 2575.3  2: HTN- - BP 101/59 - saw PCP Graciela Husbands) 05/24 - BMP 07/11/23 showed sodium 130, potassium 4.5, creatinine 1.4 & GFR 49  3:  PAD with claudication- - follows with Dr Myra Gianotti in [place] - ABI's 03/05/23 revealed mild right lower extremity arterial disease and moderate left lower extremity arterial disease  - Carotid ultrasound 03/05/2023 revealed right ICA 60-79%, left ICA 1-39%.   4: SVT- - saw cardiology Ann Maki) 08/24 - continue metoprolol succinate 12.5mg  daily  Due to HF stability and cardiology adjusting meds, will not make a return appt at this time. Advised patient to follow closely with cardiology but that he could call back for any questions or to make another appt and he was comfortable with this plan.

## 2023-08-01 NOTE — Progress Notes (Signed)
[facility] - HEART FAILURE CLINIC - PHARMACIST COUNSELING NOTE  Guideline-Directed Medical Therapy/Evidence Based Medicine  ACE/ARB/ARNI: losartan 12.5mg  daily Beta Blocker: Metoprolol succinate 25 mg daily Aldosterone Antagonist: Spironolactone 12.5 mg daily Diuretic: Torsemide 20 mg daily PRN SGLT2i:  none  Adherence Assessment  Do you ever forget to take your medication? [] Yes [x] No  Do you ever skip doses due to side effects? [] Yes [x] No  Do you have trouble affording your medicines? [] Yes [x] No  Are you ever unable to pick up your medication due to transportation difficulties? [] Yes [x] No  Do you ever stop taking your medications because you don't believe they are helping? [] Yes [x] No  Do you check your weight daily? [x] Yes [] No   Barriers to obtaining medications: none  Vital signs: HR 60, BP 101/59, weight (pounds) 158 lbs  Echo 05/25/23: EF 35-40% along with moderately elevated PA pressure and moderate/ severe MR.  Echo 12/29/21: EF 50% with mild MR.      Latest Ref Rng & Units 06/01/2023    4:17 AM 05/31/2023    4:34 AM 05/30/2023    1:30 AM  BMP  Glucose 70 - 99 mg/dL 97  629  528   BUN 8 - 23 mg/dL 21  23  27    Creatinine 0.61 - 1.24 mg/dL 4.13  2.44  0.10   Sodium 135 - 145 mmol/L 125  125  124   Potassium 3.5 - 5.1 mmol/L 3.5  3.3  4.1   Chloride 98 - 111 mmol/L 92  92  91   CO2 22 - 32 mmol/L 23  24  21    Calcium 8.9 - 10.3 mg/dL 8.2  7.8  8.7     Past Medical History:  Diagnosis Date   BPH (benign prostatic hyperplasia)    Cancer (HCC)    skin   Hypertension     ASSESSMENT 89 year old male who presents to the HF clinic for follow up. PMH includes BPH, hyperlipidemia, SVT, hypertension, bile acid associated diarrhea, PAD with claudication, remote history of eosinophilic myocarditis, bilateral carotid stenosis, previous tobacco use and chronic heart failure. NO ED admission in the last 6 months.  MedRE completed during office  visit. BP stable and patient denies ADR or issues with current therapy. No issues with medication affordability and reports compliance with therapy and lifestyle.  PLAN (Recommendation) Consider adding SGLT2i to complete GDMT (patient reluctant to med changes). Follow up as directed by NP   Time spent: 15 minutes  Ashlee Player Rodriguez-Guzman PharmD, BCPS 09/06/2023 1:33 PM    Current Outpatient Medications:    acetaminophen (TYLENOL) 500 MG tablet, Take 500 mg by mouth every 6 (six) hours as needed., Disp: , Rfl:    aspirin EC 81 MG tablet, Take 1 tablet (81 mg total) by mouth daily. Swallow whole., Disp: 30 tablet, Rfl: 12   cetirizine (ZYRTEC) 10 MG tablet, 10 mg daily as needed for allergies., Disp: , Rfl:    cholestyramine (QUESTRAN) 4 g packet, Take 4 g by mouth in the morning., Disp: , Rfl:    escitalopram (LEXAPRO) 5 MG tablet, Take 2.5 mg by mouth daily., Disp: , Rfl:    FIBER PO, Take 500 mg by mouth 3 (three) times daily., Disp: , Rfl:    fluticasone (FLONASE) 50 MCG/ACT nasal spray, Place 1 spray into both nostrils 2 (two) times daily., Disp: , Rfl:    losartan (COZAAR) 25 MG tablet, Take 12.5 mg by mouth daily., Disp: , Rfl:    melatonin  3 MG TABS tablet, Take 3 mg by mouth at bedtime., Disp: , Rfl:    metoprolol succinate (TOPROL-XL) 25 MG 24 hr tablet, Take 1 tablet (25 mg total) by mouth daily. (Patient taking differently: Take 12.5 mg by mouth daily.), Disp: 30 tablet, Rfl: 0   mirtazapine (REMERON) 15 MG tablet, Take 7.5 mg by mouth at bedtime., Disp: , Rfl:    silodosin (RAPAFLO) 4 MG CAPS capsule, Take 1 capsule (4 mg total) by mouth daily with breakfast., Disp: 90 capsule, Rfl: 3   spironolactone (ALDACTONE) 25 MG tablet, Take 0.5 tablets (12.5 mg total) by mouth daily. (Patient taking differently: Take 25 mg by mouth daily.), Disp: 30 tablet, Rfl: 1   tadalafil (CIALIS) 5 MG tablet, Take 1 tablet (5 mg total) by mouth daily., Disp: 90 tablet, Rfl: 3   torsemide (DEMADEX)  20 MG tablet, Take 1 tablet (20 mg total) by mouth daily. (Patient taking differently: Take 30 mg by mouth as needed.), Disp: 30 tablet, Rfl: 1   MEDICATION ADHERENCES TIPS AND STRATEGIES Taking medication as prescribed improves patient outcomes in heart failure (reduces hospitalizations, improves symptoms, increases survival) Side effects of medications can be managed by decreasing doses, switching agents, stopping drugs, or adding additional therapy. Please let someone in the Heart Failure Clinic know if you have having bothersome side effects so we can modify your regimen. Do not alter your medication regimen without talking to [place].  Medication reminders can help patients remember to take drugs on time. If you are missing or forgetting doses you can try linking behaviors, using pill boxes, or an electronic reminder like an alarm on your phone or an app. Some people can also get automated phone calls as medication reminders.

## 2023-08-15 ENCOUNTER — Other Ambulatory Visit
Admission: RE | Admit: 2023-08-15 | Discharge: 2023-08-15 | Disposition: A | Discharge status: Home / Self Care | Payer: Medicare Other | Source: Ambulatory Visit | Attending: Nurse Practitioner | Admitting: Nurse Practitioner

## 2023-08-15 DIAGNOSIS — I5020 Unspecified systolic (congestive) heart failure: Secondary | ICD-10-CM | POA: Diagnosis present

## 2023-08-15 DIAGNOSIS — E871 Hypo-osmolality and hyponatremia: Secondary | ICD-10-CM | POA: Insufficient documentation

## 2023-08-15 LAB — BRAIN NATRIURETIC PEPTIDE: B Natriuretic Peptide: 1090.9 pg/mL — ABNORMAL HIGH (ref 0.0–100.0)

## 2023-08-23 ENCOUNTER — Other Ambulatory Visit
Admission: RE | Admit: 2023-08-23 | Discharge: 2023-08-23 | Disposition: A | Discharge status: Home / Self Care | Payer: Medicare Other | Source: Ambulatory Visit | Attending: Nurse Practitioner | Admitting: Nurse Practitioner

## 2023-08-23 DIAGNOSIS — I5021 Acute systolic (congestive) heart failure: Secondary | ICD-10-CM | POA: Diagnosis present

## 2023-08-23 DIAGNOSIS — I471 Supraventricular tachycardia, unspecified: Secondary | ICD-10-CM | POA: Insufficient documentation

## 2023-08-23 DIAGNOSIS — I700 Atherosclerosis of aorta: Secondary | ICD-10-CM | POA: Diagnosis present

## 2023-08-23 DIAGNOSIS — E871 Hypo-osmolality and hyponatremia: Secondary | ICD-10-CM | POA: Diagnosis present

## 2023-08-23 DIAGNOSIS — E7849 Other hyperlipidemia: Secondary | ICD-10-CM | POA: Diagnosis present

## 2023-08-23 DIAGNOSIS — I5020 Unspecified systolic (congestive) heart failure: Secondary | ICD-10-CM | POA: Insufficient documentation

## 2023-08-23 DIAGNOSIS — I10 Essential (primary) hypertension: Secondary | ICD-10-CM | POA: Insufficient documentation

## 2023-08-23 LAB — BRAIN NATRIURETIC PEPTIDE: B Natriuretic Peptide: 939.9 pg/mL — ABNORMAL HIGH (ref 0.0–100.0)

## 2023-09-06 ENCOUNTER — Other Ambulatory Visit
Admission: RE | Admit: 2023-09-06 | Discharge: 2023-09-06 | Disposition: A | Discharge status: Home / Self Care | Payer: Medicare Other | Source: Ambulatory Visit | Attending: Nurse Practitioner | Admitting: Nurse Practitioner

## 2023-09-06 DIAGNOSIS — E7849 Other hyperlipidemia: Secondary | ICD-10-CM | POA: Insufficient documentation

## 2023-09-06 DIAGNOSIS — I10 Essential (primary) hypertension: Secondary | ICD-10-CM | POA: Insufficient documentation

## 2023-09-06 DIAGNOSIS — I5020 Unspecified systolic (congestive) heart failure: Secondary | ICD-10-CM | POA: Diagnosis present

## 2023-09-06 DIAGNOSIS — I700 Atherosclerosis of aorta: Secondary | ICD-10-CM | POA: Insufficient documentation

## 2023-09-06 DIAGNOSIS — E871 Hypo-osmolality and hyponatremia: Secondary | ICD-10-CM | POA: Insufficient documentation

## 2023-09-06 DIAGNOSIS — I739 Peripheral vascular disease, unspecified: Secondary | ICD-10-CM | POA: Insufficient documentation

## 2023-09-06 LAB — BRAIN NATRIURETIC PEPTIDE: B Natriuretic Peptide: 1755.3 pg/mL — ABNORMAL HIGH (ref 0.0–100.0)

## 2023-09-10 ENCOUNTER — Ambulatory Visit (HOSPITAL_COMMUNITY)
Admission: RE | Admit: 2023-09-10 | Discharge: 2023-09-10 | Disposition: A | Discharge status: Home / Self Care | Payer: Medicare Other | Source: Ambulatory Visit | Attending: Surgery | Admitting: Surgery

## 2023-09-10 ENCOUNTER — Ambulatory Visit (INDEPENDENT_AMBULATORY_CARE_PROVIDER_SITE_OTHER): Payer: Medicare Other | Admitting: Physician Assistant

## 2023-09-10 VITALS — BP 109/56 | HR 63 | Temp 97.6°F | Resp 18 | Ht 70.0 in | Wt 159.7 lb

## 2023-09-10 DIAGNOSIS — I6523 Occlusion and stenosis of bilateral carotid arteries: Secondary | ICD-10-CM

## 2023-09-10 DIAGNOSIS — I739 Peripheral vascular disease, unspecified: Secondary | ICD-10-CM

## 2023-09-10 NOTE — Progress Notes (Signed)
Office Note     CC:  follow up Requesting Provider:  Lynnea Ferrier, MD  HPI: Brian Little is a 89 y.o. ([birth year]) male who presents with his wife for routine follow up of Carotid artery stenosis. Overall he says he has been doing well. He was in the hospital in July for acute CHF exacerbation. Reports doing well since. He denies any visual changes, slurred speech, facial drooping, unilateral upper or lower extremity weakness or numbness. He does ambulate with cane and his balance is unsteady. He also continues to have some paraesthesias with sensation of having socks on his feet when he does not. This has been present for a while. Unchanged. He otherwise does not have any pain on ambulation or rest. No non healing wounds. He does have some superficial scabs on his left 2nd toe and ankle. He said he his his foot several weeks ago but it appears to be healing well.   The pt is not on a statin for cholesterol management.    The pt is on a 325 mg aspirin.    Other AC:  none The pt is on ARB, CCB, hyrdalazine for hypertension.  The pt is not on medication for diabetes. Tobacco hx:  former  Past Medical History:  Diagnosis Date   BPH (benign prostatic hyperplasia)    Cancer (HCC)    skin   Hypertension     Past Surgical History:  Procedure Laterality Date   ADENOIDECTOMY     89 yrs old   PARTIAL COLECTOMY     polyps/ appendix removed at same time   ROTATOR CUFF REPAIR     x 2, 2008, 2011   ROTATOR CUFF REPAIR     left and right   SHOULDER ARTHROSCOPY WITH OPEN ROTATOR CUFF REPAIR AND DISTAL CLAVICLE ACROMINECTOMY Left 10/31/2022   Procedure: SHOULDER ARTHROSCOPY WITH OPEN ROTATOR CUFF REPAIR AND DISTAL CLAVICLE ACROMINECTOMY;  Surgeon: Juanell Fairly, MD;  Location: [facility] ORS;  Service: Orthopedics;  Laterality: Left;   TONSILLECTOMY     89 years old    Social History   Socioeconomic History   Marital status: Married    Spouse name: Not on file   Number of children: Not  on file   Years of education: Not on file   Highest education level: Not on file  Occupational History   Not on file  Tobacco Use   Smoking status: Former    Current packs/day: 0.00    Types: Cigarettes    Quit date: 01/10/1980    Years since quitting: 43.6   Smokeless tobacco: Never  Vaping Use   Vaping status: Never Used  Substance and Sexual Activity   Alcohol use: Never   Drug use: Never   Sexual activity: Yes    Birth control/protection: None  Other Topics Concern   Not on file  Social History Narrative   Not on file   Social Determinants of Health   Financial Resource Strain: Not on file  Food Insecurity: No Food Insecurity (05/31/2023)   Hunger Vital Sign    Worried About Running Out of Food in the Last Year: Never true    Ran Out of Food in the Last Year: Never true  Transportation Needs: No Transportation Needs (05/31/2023)   PRAPARE - Administrator, Civil Service (Medical): No    Lack of Transportation (Non-Medical): No  Physical Activity: Not on file  Stress: Not on file  Social Connections: Not on file  Intimate  Partner Violence: Not At Risk (05/31/2023)   Humiliation, Afraid, Rape, and Kick questionnaire    Fear of Current or Ex-Partner: No    Emotionally Abused: No    Physically Abused: No    Sexually Abused: No    Family History  Problem Relation Age of Onset   Heart disease Father    Congestive Heart Failure Father    COPD Mother    Hypertension Mother     Current Outpatient Medications  Medication Sig Dispense Refill   acetaminophen (TYLENOL) 500 MG tablet Take 500 mg by mouth every 6 (six) hours as needed.     aspirin EC 81 MG tablet Take 1 tablet (81 mg total) by mouth daily. Swallow whole. 30 tablet 12   cetirizine (ZYRTEC) 10 MG tablet 10 mg daily as needed for allergies.     cholestyramine (QUESTRAN) 4 g packet Take 4 g by mouth in the morning.     escitalopram (LEXAPRO) 5 MG tablet Take 2.5 mg by mouth daily.     FIBER PO  Take 500 mg by mouth 3 (three) times daily.     fluticasone (FLONASE) 50 MCG/ACT nasal spray Place 1 spray into both nostrils 2 (two) times daily.     losartan (COZAAR) 25 MG tablet Take 12.5 mg by mouth daily.     melatonin 3 MG TABS tablet Take 3 mg by mouth at bedtime.     metoprolol succinate (TOPROL-XL) 25 MG 24 hr tablet Take 1 tablet (25 mg total) by mouth daily. (Patient taking differently: Take 12.5 mg by mouth daily.) 30 tablet 0   mirtazapine (REMERON) 15 MG tablet Take 7.5 mg by mouth at bedtime.     silodosin (RAPAFLO) 4 MG CAPS capsule Take 1 capsule (4 mg total) by mouth daily with breakfast. 90 capsule 3   spironolactone (ALDACTONE) 25 MG tablet Take 0.5 tablets (12.5 mg total) by mouth daily. (Patient taking differently: Take 25 mg by mouth daily.) 30 tablet 1   tadalafil (CIALIS) 5 MG tablet Take 1 tablet (5 mg total) by mouth daily. 90 tablet 3   torsemide (DEMADEX) 20 MG tablet Take 1 tablet (20 mg total) by mouth daily. (Patient taking differently: Take 30 mg by mouth as needed.) 30 tablet 1   No current facility-administered medications for this visit.    Allergies  Allergen Reactions   Bee Venom Anaphylaxis   Caffeine Other (See Comments)    Other reaction(s): Other Vertigo vertigo    Ibuprofen     Other reaction(s): Other (See Comments), Other (See Comments) vertigo vertigo    Nsaids Other (See Comments)    Other reaction(s): Other (See Comments) Vertigo and n/v Vertigo and n/v    Trazodone Other (See Comments)    "Dizziness" per pt report     REVIEW OF SYSTEMS:  [X]  denotes positive finding, [ ]  denotes negative finding Cardiac  Comments:  Chest pain or chest pressure:    Shortness of breath upon exertion:    Short of breath when lying flat:    Irregular heart rhythm:        Vascular    Pain in calf, thigh, or hip brought on by ambulation:    Pain in feet at night that wakes you up from your sleep:     Blood clot in your veins:    Leg  swelling:         Pulmonary    Oxygen at home:    Productive cough:     Wheezing:  Neurologic    Sudden weakness in arms or legs:     Sudden numbness in arms or legs:     Sudden onset of difficulty speaking or slurred speech:    Temporary loss of vision in one eye:     Problems with dizziness:         Gastrointestinal    Blood in stool:     Vomited blood:         Genitourinary    Burning when urinating:     Blood in urine:        Psychiatric    Major depression:         Hematologic    Bleeding problems:    Problems with blood clotting too easily:        Skin    Rashes or ulcers:        Constitutional    Fever or chills:      PHYSICAL EXAMINATION:  Vitals:   09/10/23 1027  BP: (!) 109/56  Pulse: 63  Resp: 18  Temp: 97.6 F (36.4 C)  SpO2: 97%  Weight: 159 lb 11.2 oz (72.4 kg)  Height: 5\' 10"  (1.778 m)    General:  WDWN in NAD; vital signs documented above Gait: ambulates with cane HENT: WNL, normocephalic Pulmonary: normal non-labored breathing , without wheezing Cardiac: regular HR Abdomen: soft Vascular Exam/Pulses: 2+ radial, 2+ femoral, 2+ PT pulses bilaterally Extremities: without ischemic changes, without Gangrene , without cellulitis; without open wounds; Small scab on dorsum of left 2nd toe and left lateral malleolus Musculoskeletal: no muscle wasting or atrophy  Neurologic: A&O X 3 Psychiatric:  The pt has Normal affect.   Non-Invasive Vascular Imaging:   VAS Carotid Duplex: Summary:  Right Carotid: Velocities in the right ICA are consistent with a 60-79% stenosis. The ECA appears >50% stenosed.   Left Carotid: Velocities in the left ICA are consistent with a 1-39% stenosis.   Vertebrals: Bilateral vertebral arteries demonstrate antegrade flow.  Subclavians: Normal flow hemodynamics were seen in bilateral subclavian arteries.    ASSESSMENT/PLAN:: 89 y.o. male here for follow up for carotid artery stenosis. He is without any  associated symptoms. He also does not have any claudication, rest pain or tissue loss. He has some superficial abrasions on left foot but these do appear to be healing well. Encourage continued walking program - Duplex today shows left ICA with 60-79% stenosis. This is essentially unchanged from prior duplex. Right ICA with 1-39% stenosis. Normal flow in the vertebral and subclavian arteries bilaterally - continue Aspirin  - reviewed signs and symptoms of TIA/ Stroke and he understands should this occur to seek immediate medical attention -He will follow up again in 6 months with Carotid duplex   Graceann Congress, PA-C [facility] [phone]  Clinic MD:   Myra Gianotti

## 2023-09-19 ENCOUNTER — Other Ambulatory Visit
Admission: RE | Admit: 2023-09-19 | Discharge: 2023-09-19 | Disposition: A | Discharge status: Home / Self Care | Payer: Medicare Other | Source: Ambulatory Visit | Attending: Nurse Practitioner | Admitting: Nurse Practitioner

## 2023-09-19 ENCOUNTER — Emergency Department: Payer: Medicare Other

## 2023-09-19 ENCOUNTER — Other Ambulatory Visit: Payer: Self-pay

## 2023-09-19 ENCOUNTER — Inpatient Hospital Stay
Admission: EM | Admit: 2023-09-19 | Discharge: 2023-09-24 | DRG: 291 | Disposition: A | Discharge status: Home / Self Care | Payer: Medicare Other | Source: Ambulatory Visit | Attending: Internal Medicine | Admitting: Internal Medicine

## 2023-09-19 ENCOUNTER — Inpatient Hospital Stay
Admit: 2023-09-19 | Discharge: 2023-09-19 | Disposition: A | Discharge status: Home / Self Care | Payer: Medicare Other | Attending: Internal Medicine

## 2023-09-19 ENCOUNTER — Encounter: Payer: Self-pay | Admitting: Emergency Medicine

## 2023-09-19 DIAGNOSIS — N179 Acute kidney failure, unspecified: Secondary | ICD-10-CM | POA: Diagnosis present

## 2023-09-19 DIAGNOSIS — I440 Atrioventricular block, first degree: Secondary | ICD-10-CM | POA: Diagnosis present

## 2023-09-19 DIAGNOSIS — R7989 Other specified abnormal findings of blood chemistry: Secondary | ICD-10-CM | POA: Diagnosis present

## 2023-09-19 DIAGNOSIS — Z87891 Personal history of nicotine dependence: Secondary | ICD-10-CM | POA: Diagnosis not present

## 2023-09-19 DIAGNOSIS — N401 Enlarged prostate with lower urinary tract symptoms: Secondary | ICD-10-CM | POA: Diagnosis present

## 2023-09-19 DIAGNOSIS — I429 Cardiomyopathy, unspecified: Secondary | ICD-10-CM | POA: Diagnosis present

## 2023-09-19 DIAGNOSIS — I700 Atherosclerosis of aorta: Secondary | ICD-10-CM | POA: Diagnosis present

## 2023-09-19 DIAGNOSIS — Z85828 Personal history of other malignant neoplasm of skin: Secondary | ICD-10-CM

## 2023-09-19 DIAGNOSIS — J9811 Atelectasis: Secondary | ICD-10-CM | POA: Diagnosis present

## 2023-09-19 DIAGNOSIS — E871 Hypo-osmolality and hyponatremia: Secondary | ICD-10-CM | POA: Diagnosis present

## 2023-09-19 DIAGNOSIS — I272 Pulmonary hypertension, unspecified: Secondary | ICD-10-CM | POA: Diagnosis present

## 2023-09-19 DIAGNOSIS — I739 Peripheral vascular disease, unspecified: Secondary | ICD-10-CM | POA: Diagnosis present

## 2023-09-19 DIAGNOSIS — Z886 Allergy status to analgesic agent status: Secondary | ICD-10-CM

## 2023-09-19 DIAGNOSIS — I2489 Other forms of acute ischemic heart disease: Secondary | ICD-10-CM | POA: Diagnosis present

## 2023-09-19 DIAGNOSIS — I5020 Unspecified systolic (congestive) heart failure: Secondary | ICD-10-CM | POA: Insufficient documentation

## 2023-09-19 DIAGNOSIS — Z66 Do not resuscitate: Secondary | ICD-10-CM | POA: Diagnosis present

## 2023-09-19 DIAGNOSIS — R0609 Other forms of dyspnea: Secondary | ICD-10-CM

## 2023-09-19 DIAGNOSIS — I10 Essential (primary) hypertension: Secondary | ICD-10-CM | POA: Diagnosis present

## 2023-09-19 DIAGNOSIS — N138 Other obstructive and reflux uropathy: Secondary | ICD-10-CM | POA: Diagnosis present

## 2023-09-19 DIAGNOSIS — Z8249 Family history of ischemic heart disease and other diseases of the circulatory system: Secondary | ICD-10-CM | POA: Diagnosis not present

## 2023-09-19 DIAGNOSIS — I5023 Acute on chronic systolic (congestive) heart failure: Principal | ICD-10-CM

## 2023-09-19 DIAGNOSIS — N183 Chronic kidney disease, stage 3 unspecified: Secondary | ICD-10-CM | POA: Diagnosis present

## 2023-09-19 DIAGNOSIS — Z888 Allergy status to other drugs, medicaments and biological substances status: Secondary | ICD-10-CM

## 2023-09-19 DIAGNOSIS — Z8679 Personal history of other diseases of the circulatory system: Secondary | ICD-10-CM

## 2023-09-19 DIAGNOSIS — F419 Anxiety disorder, unspecified: Secondary | ICD-10-CM | POA: Diagnosis present

## 2023-09-19 DIAGNOSIS — Z9049 Acquired absence of other specified parts of digestive tract: Secondary | ICD-10-CM | POA: Diagnosis not present

## 2023-09-19 DIAGNOSIS — E785 Hyperlipidemia, unspecified: Secondary | ICD-10-CM | POA: Diagnosis present

## 2023-09-19 DIAGNOSIS — Z79899 Other long term (current) drug therapy: Secondary | ICD-10-CM

## 2023-09-19 DIAGNOSIS — Z825 Family history of asthma and other chronic lower respiratory diseases: Secondary | ICD-10-CM | POA: Diagnosis not present

## 2023-09-19 DIAGNOSIS — I130 Hypertensive heart and chronic kidney disease with heart failure and stage 1 through stage 4 chronic kidney disease, or unspecified chronic kidney disease: Secondary | ICD-10-CM | POA: Diagnosis present

## 2023-09-19 DIAGNOSIS — I080 Rheumatic disorders of both mitral and aortic valves: Secondary | ICD-10-CM | POA: Diagnosis present

## 2023-09-19 DIAGNOSIS — Z7982 Long term (current) use of aspirin: Secondary | ICD-10-CM

## 2023-09-19 DIAGNOSIS — R0602 Shortness of breath: Secondary | ICD-10-CM | POA: Diagnosis present

## 2023-09-19 LAB — COMPREHENSIVE METABOLIC PANEL
ALT: 22 U/L (ref 0–44)
AST: 35 U/L (ref 15–41)
Albumin: 3.8 g/dL (ref 3.5–5.0)
Alkaline Phosphatase: 136 U/L — ABNORMAL HIGH (ref 38–126)
Anion gap: 10 (ref 5–15)
BUN: 61 mg/dL — ABNORMAL HIGH (ref 8–23)
CO2: 24 mmol/L (ref 22–32)
Calcium: 8.3 mg/dL — ABNORMAL LOW (ref 8.9–10.3)
Chloride: 95 mmol/L — ABNORMAL LOW (ref 98–111)
Creatinine, Ser: 2.08 mg/dL — ABNORMAL HIGH (ref 0.61–1.24)
GFR, Estimated: 30 mL/min — ABNORMAL LOW (ref >60)
Glucose, Bld: 119 mg/dL — ABNORMAL HIGH (ref 70–99)
Potassium: 4 mmol/L (ref 3.5–5.1)
Sodium: 129 mmol/L — ABNORMAL LOW (ref 135–145)
Total Bilirubin: 1.1 mg/dL (ref 0.3–1.2)
Total Protein: 7.3 g/dL (ref 6.5–8.1)

## 2023-09-19 LAB — BRAIN NATRIURETIC PEPTIDE: B Natriuretic Peptide: >4500 pg/mL — ABNORMAL HIGH (ref 0.0–100.0)

## 2023-09-19 LAB — TROPONIN I (HIGH SENSITIVITY): Troponin I (High Sensitivity): 192 ng/L (ref <18)

## 2023-09-19 MED ORDER — TAMSULOSIN HCL 0.4 MG PO CAPS
0.4000 mg | ORAL_CAPSULE | Freq: Every day | ORAL | Status: DC
  Administered 2023-09-20 – 2023-09-24 (×5): 0.4 mg via ORAL
  Filled 2023-09-19 (×5): qty 1

## 2023-09-19 MED ORDER — LORATADINE 10 MG PO TABS
10.0000 mg | ORAL_TABLET | Freq: Every day | ORAL | Status: DC
  Administered 2023-09-20 – 2023-09-24 (×3): 10 mg via ORAL
  Filled 2023-09-19 (×5): qty 1

## 2023-09-19 MED ORDER — FUROSEMIDE 10 MG/ML IJ SOLN
40.0000 mg | Freq: Two times a day (BID) | INTRAMUSCULAR | Status: DC
  Administered 2023-09-20: 40 mg via INTRAVENOUS
  Filled 2023-09-19: qty 4

## 2023-09-19 MED ORDER — ESCITALOPRAM OXALATE 10 MG PO TABS
5.0000 mg | ORAL_TABLET | Freq: Every day | ORAL | Status: DC
  Administered 2023-09-20 – 2023-09-24 (×5): 5 mg via ORAL
  Filled 2023-09-19 (×5): qty 1

## 2023-09-19 MED ORDER — FLUTICASONE PROPIONATE 50 MCG/ACT NA SUSP: 1.0000 | Freq: Every day | NASAL | Status: DC | PRN

## 2023-09-19 MED ORDER — FUROSEMIDE 10 MG/ML IJ SOLN
40.0000 mg | Freq: Once | INTRAMUSCULAR | Status: AC
  Administered 2023-09-19: 40 mg via INTRAVENOUS
  Filled 2023-09-19: qty 4

## 2023-09-19 MED ORDER — SORBITOL 70 % SOLN
30.0000 mL | Freq: Every day | Status: DC | PRN
Start: 2023-09-19 — End: 2023-09-24

## 2023-09-19 MED ORDER — SENNOSIDES-DOCUSATE SODIUM 8.6-50 MG PO TABS: 1.0000 | ORAL_TABLET | Freq: Every evening | ORAL | Status: DC | PRN

## 2023-09-19 MED ORDER — ENOXAPARIN SODIUM 30 MG/0.3ML IJ SOSY
30.0000 mg | PREFILLED_SYRINGE | INTRAMUSCULAR | Status: DC
  Administered 2023-09-19 – 2023-09-22 (×3): 30 mg via SUBCUTANEOUS
  Filled 2023-09-19 (×4): qty 0.3

## 2023-09-19 MED ORDER — ACETAMINOPHEN 325 MG PO TABS: 650.0000 mg | ORAL_TABLET | Freq: Four times a day (QID) | ORAL | Status: DC | PRN

## 2023-09-19 MED ORDER — PANCRELIPASE (LIP-PROT-AMYL) 36000-114000 UNITS PO CPEP
36000.0000 [IU] | ORAL_CAPSULE | Freq: Three times a day (TID) | ORAL | Status: DC
Start: 2023-09-20 — End: 2023-09-24
  Administered 2023-09-20 – 2023-09-24 (×14): 36000 [IU] via ORAL
  Filled 2023-09-19 (×15): qty 1

## 2023-09-19 MED ORDER — ASPIRIN 81 MG PO TBEC
81.0000 mg | DELAYED_RELEASE_TABLET | Freq: Every day | ORAL | Status: DC
  Administered 2023-09-20 – 2023-09-24 (×5): 81 mg via ORAL
  Filled 2023-09-19 (×6): qty 1

## 2023-09-19 MED ORDER — ACETAMINOPHEN 650 MG RE SUPP: 650.0000 mg | Freq: Four times a day (QID) | RECTAL | Status: DC | PRN

## 2023-09-19 MED ORDER — MIRTAZAPINE 15 MG PO TABS
7.5000 mg | ORAL_TABLET | Freq: Every day | ORAL | Status: DC
  Administered 2023-09-19 – 2023-09-21 (×3): 7.5 mg via ORAL
  Filled 2023-09-19 (×3): qty 1

## 2023-09-19 NOTE — ED Notes (Signed)
Pt having multiple PVCs. Noted on monitor. Assigned MD made aware.

## 2023-09-19 NOTE — Assessment & Plan Note (Addendum)
Pt presents with dyspnea on exertion, BNP > 4500, chest x-ray with vascular congestion and small right pleural effusion.  No peripheral edema.  Prior Echo 05/25/2023 -- EF 35-40%, normal diastolic fx, moderately elevated PASP, mod-severe MR, mild-mod AR Sent to ED from Cardiology clinic.  Echo this admission: EF 20-25%, grade 3 DD --Cardiology consulted --Continue IV Lasix to 80 mg BID --Strict Io's & daily weights --Follow renal function & electrolytes daily --Further GDMT per cardiology, as BP and renal function permit

## 2023-09-19 NOTE — ED Notes (Signed)
This RN attempted IV x2 with no success. Will have another RN attempt.

## 2023-09-19 NOTE — ED Notes (Signed)
Pt given a urinal and advised we needed to measure his output.

## 2023-09-19 NOTE — Assessment & Plan Note (Addendum)
BP's 116/104 >> 114/74 in the ED Per med hx - pt no longer takes spironolactone or losartan. Now on low dose Toprol-XL, torsemide On midodrine to tolerate cardiac meds

## 2023-09-19 NOTE — ED Triage Notes (Signed)
Pt via POV from home. Pt sent over from PCP, reports that he has been having SOB gradually for the past couple of weeks and worse the past couple of days. Pt has a hx of CHF and pleural effusion. PCP reports BNP of greater than 4500. Denies any swelling. Denies pain. Pt is A&Ox4 and NAD

## 2023-09-19 NOTE — Assessment & Plan Note (Addendum)
Demand ischemia due to CHF decompensation.  EKG no acute ischemic changes.  Pt denies current or recent chest pain.  Unlikely ACS. --Cardiology consulted

## 2023-09-19 NOTE — ED Provider Notes (Signed)
[facility] Provider Note    Event Date/Time   First MD Initiated Contact with Patient 09/19/23 1508     (approximate)   History   Shortness of Breath   HPI Brian Little is a 89 y.o. male with history of HTN, HLD, HFrEF with EF 35 to 40% presenting today for shortness of breath.  Patient states over the past week he has had worsening dyspnea on exertion and now only able to walk less than 50 feet without having to stop and sit down.  He has not noticed any new edema to his lower extremities.  Denies any leg pain.  Otherwise denying fevers, chills, cough, congestion, chest pain, abdominal pain, nausea, vomiting.  Patient was seen at his cardiology clinic today and was sent over for assistance with diuresis given worsening heart failure symptoms and admission.  Chart review: Patient did have admission in July 2024 for shortness of breath along with elevated troponins.  Was newly diagnosed with CHF in July of this year.     Physical Exam   Triage Vital Signs: ED Triage Vitals  Encounter Vitals Group     BP 09/19/23 1357 (!) 116/104     Systolic BP Percentile --      Diastolic BP Percentile --      Pulse Rate 09/19/23 1357 (!) 102     Resp 09/19/23 1357 20     Temp 09/19/23 1357 97.7 F (36.5 C)     Temp Source 09/19/23 1357 Oral     SpO2 09/19/23 1357 98 %     Weight 09/19/23 1355 153 lb (69.4 kg)     Height 09/19/23 1355 5\' 9"  (1.753 m)     Head Circumference --      Peak Flow --      Pain Score 09/19/23 1355 0     Pain Loc --      Pain Education --      Exclude from Growth Chart --     Most recent vital signs: Vitals:   09/19/23 1357  BP: (!) 116/104  Pulse: (!) 102  Resp: 20  Temp: 97.7 F (36.5 C)  SpO2: 98%   Physical Exam: I have reviewed the vital signs and nursing notes. General: Awake, alert, no acute distress.  Nontoxic appearing. Head:  Atraumatic, normocephalic.   ENT:  EOM intact, PERRL. Oral mucosa is pink and moist  with no lesions. Neck: Neck is supple with full range of motion, No meningeal signs. Cardiovascular:  RRR, No murmurs. Peripheral pulses palpable and equal bilaterally. Respiratory:  Symmetrical chest wall expansion.  No rhonchi, rales, or wheezes.  Good air movement throughout.  No use of accessory muscles.  Mildly decreased lung sounds in the right lower base. Musculoskeletal:  No cyanosis or edema. Moving extremities with full ROM Abdomen:  Soft, nontender, nondistended. Neuro:  GCS 15, moving all four extremities, interacting appropriately. Speech clear. Psych:  Calm, appropriate.   Skin:  Warm, dry, no rash.    ED Results / Procedures / Treatments   Labs (all labs ordered are listed, but only abnormal results are displayed) Labs Reviewed  COMPREHENSIVE METABOLIC PANEL - Abnormal; Notable for the following components:      Result Value   Sodium 129 (*)    Chloride 95 (*)    Glucose, Bld 119 (*)    BUN 61 (*)    Creatinine, Ser 2.08 (*)    Calcium 8.3 (*)    Alkaline Phosphatase 136 (*)  GFR, Estimated 30 (*)    All other components within normal limits  TROPONIN I (HIGH SENSITIVITY)     EKG My EKG interpretation: Rate of 89 with first-degree AV block with PR interval 250.  Left axis deviation.  Occasional PVCs.  No acute ST elevations or depressions   RADIOLOGY Independently interpreted chest x-ray with mild right-sided pleural effusion and some increasing interstitial markings likely representing pulmonary edema.  Pleural effusion on the right side is new from most recent chest x-ray in July of this year.   PROCEDURES:  Critical Care performed: No  Procedures   MEDICATIONS ORDERED IN ED: Medications  furosemide (LASIX) injection 40 mg (has no administration in time range)     IMPRESSION / MDM / ASSESSMENT AND PLAN / ED COURSE  I reviewed the triage vital signs and the nursing notes.                              Differential diagnosis includes, but is  not limited to, CHF exacerbation, pneumonia  Patient's presentation is most consistent with severe exacerbation of chronic illness.  Patient is an 89 year old male presenting today for worsening CHF with DOE. Seen at outpatient cardiology clinic where he was found to have worsening ambulation with SOB, BNP > 4,500. Sent for diuresis and repeat echo. Patient exam largely reassuring. CXR shows small right sided pleural effusion. No pre-tibial edema. No other pain symptoms to suggest ACS or PE. New aki present as well likely contributing to fluid retention. Talked with Dr. Corky Sing with cardiology who agrees with admission for diuresis and repeat ECHO. Patient given 40mg  IV lasix in the ED. No hypoxia. Admitted to hospitalist for further care.  The patient is on the cardiac monitor to evaluate for evidence of arrhythmia and/or significant heart rate changes. Clinical Course as of 09/19/23 1543  Wed Sep 19, 2023  1533 Creatinine(!): 2.08 [DW]  1543 Dr. Corky Sing - diuresis, repeat echo, will see tomorrow [DW]    Clinical Course User Index [DW] Janith Lima, MD     FINAL CLINICAL IMPRESSION(S) / ED DIAGNOSES   Final diagnoses:  Acute on chronic systolic heart failure (HCC)  Dyspnea on exertion     Rx / DC Orders   ED Discharge Orders     None        Note:  This document was prepared using Dragon voice recognition software and may include unintentional dictation errors.   Janith Lima, MD 09/19/23 [phone]

## 2023-09-19 NOTE — Progress Notes (Signed)
Anticoagulation monitoring(Lovenox):  89yo  M ordered Lovenox 40 mg Q24h    Filed Weights   09/19/23 1355  Weight: 69.4 kg (153 lb)   BMI 22   Lab Results  Component Value Date   CREATININE 2.08 (H) 09/19/2023   CREATININE 1.13 06/01/2023   CREATININE 1.19 05/31/2023   Estimated Creatinine Clearance: 24.6 mL/min (A) (by C-G formula based on SCr of 2.08 mg/dL (H)). Hemoglobin & Hematocrit     Component Value Date/Time   HGB 13.1 05/31/2023 0434   HCT 38.8 (L) 05/31/2023 0434     Per Protocol for Patient with estCrcl < 30 ml/min and BMI < 30, will transition to Lovenox 30 mg Q24h       Bari Mantis PharmD Clinical Pharmacist 09/19/2023

## 2023-09-19 NOTE — Assessment & Plan Note (Signed)
Flomax sub'd for home silodosin/Rapaflo

## 2023-09-19 NOTE — H&P (Signed)
History and Physical    Patient: Brian Little EAV:[id number] DOB: 03-18-1934 DOA: 09/19/2023 DOS: the patient was seen and examined on 09/19/2023 PCP: Lynnea Ferrier, MD  Patient coming from: Home  Chief Complaint:  Chief Complaint  Patient presents with   Shortness of Breath   HPI: Brian Little is a 89 y.o. male with medical history significant of HFrEF (Echo in July EF 35-40%)  BPH, hypertension, bile acid associated diarrhea, PAD with claudication, remote history of eosinophilic myocarditis, former smoker who presents to the ED from Cardiology clinic today for concerns of decompensated HFrEF.  He reports progressively worsening dyspnea on exertion. Typically not dyspneic with activity, currently only able to ambulate about 50' before needing to rest to catch his breath.  Denies any current or recent chest pain.  No recent illnesses and denies fever/chills, sore throat, cough, congestion, N/V/D or abdominal pain, dysuria.  No unilateral weakness numbness tingling, no new or persistent dizziness or changes in coordination.    ED course ---  initial vitals Tempt 97.50F, HR 102 >> 73, RR 20, BP 110/104 >> 114/74, spO2 98% on RA.  Labs --- BNP > 4500, CMP notable for sodium 129, Cl 95, BUN 61, Cr 2.08 (baseline in low 1's), alk phos 136, normal transaminases. Imaging --- Chest xray shows pulmonary vascular congestion and small right pleural effusion and associated atelectasis. EKG --- sinus rhythm 89 bpm, 1st degree AV block, PVC's, no ST elevation  ED provider contacted Cardiologist on call with [facility] and agreed with admission for diuresis and further evaluation.     Review of Systems: As mentioned in the history of present illness. All other systems reviewed and are negative.   Past Medical History:  Diagnosis Date   BPH (benign prostatic hyperplasia)    Cancer (HCC)    skin   Hypertension    Past Surgical History:  Procedure Laterality Date   ADENOIDECTOMY     89 yrs old    PARTIAL COLECTOMY     polyps/ appendix removed at same time   ROTATOR CUFF REPAIR     x 2, 2008, 2011   ROTATOR CUFF REPAIR     left and right   SHOULDER ARTHROSCOPY WITH OPEN ROTATOR CUFF REPAIR AND DISTAL CLAVICLE ACROMINECTOMY Left 10/31/2022   Procedure: SHOULDER ARTHROSCOPY WITH OPEN ROTATOR CUFF REPAIR AND DISTAL CLAVICLE ACROMINECTOMY;  Surgeon: Juanell Fairly, MD;  Location: [facility] ORS;  Service: Orthopedics;  Laterality: Left;   TONSILLECTOMY     89 years old   Social History:  reports that he quit smoking about 43 years ago. His smoking use included cigarettes. He has never used smokeless tobacco. He reports that he does not drink alcohol and does not use drugs.  Allergies  Allergen Reactions   Bee Venom Anaphylaxis   Caffeine Other (See Comments)    Other reaction(s): Other Vertigo vertigo    Ibuprofen     Other reaction(s): Other (See Comments), Other (See Comments) vertigo vertigo    Nsaids Other (See Comments)    Other reaction(s): Other (See Comments) Vertigo and n/v Vertigo and n/v    Trazodone Other (See Comments)    "Dizziness" per pt report    Family History  Problem Relation Age of Onset   Heart disease Father    Congestive Heart Failure Father    COPD Mother    Hypertension Mother     Prior to Admission medications   Medication Sig Start Date End Date Taking? Authorizing Provider  acetaminophen (  TYLENOL) 500 MG tablet Take 500 mg by mouth every 6 (six) hours as needed.    [provider]  aspirin EC 81 MG tablet Take 1 tablet (81 mg total) by mouth daily. Swallow whole. 05/27/23   Charise Killian, MD  cetirizine (ZYRTEC) 10 MG tablet 10 mg daily as needed for allergies. 01/21/13   [provider]  cholestyramine (QUESTRAN) 4 g packet Take 4 g by mouth in the morning. 05/21/23 05/20/24  [provider]  escitalopram (LEXAPRO) 5 MG tablet Take 2.5 mg by mouth daily.    [provider]  FIBER PO Take 500 mg by mouth  3 (three) times daily.    [provider]  fluticasone (FLONASE) 50 MCG/ACT nasal spray Place 1 spray into both nostrils 2 (two) times daily. 11/10/21   [provider]  losartan (COZAAR) 25 MG tablet Take 12.5 mg by mouth daily.    [provider]  melatonin 3 MG TABS tablet Take 3 mg by mouth at bedtime.    [provider]  metoprolol succinate (TOPROL-XL) 25 MG 24 hr tablet Take 1 tablet (25 mg total) by mouth daily. Patient taking differently: Take 12.5 mg by mouth daily. 05/27/23 07/16/23  Charise Killian, MD  mirtazapine (REMERON) 15 MG tablet Take 7.5 mg by mouth at bedtime.    [provider]  silodosin (RAPAFLO) 4 MG CAPS capsule Take 1 capsule (4 mg total) by mouth daily with breakfast. 09/22/22   Stoioff, Verna Czech, MD  spironolactone (ALDACTONE) 25 MG tablet Take 0.5 tablets (12.5 mg total) by mouth daily. Patient taking differently: Take 25 mg by mouth daily. 06/02/23   Enedina Finner, MD  tadalafil (CIALIS) 5 MG tablet Take 1 tablet (5 mg total) by mouth daily. 09/22/22   Stoioff, Verna Czech, MD  torsemide (DEMADEX) 20 MG tablet Take 1 tablet (20 mg total) by mouth daily. Patient taking differently: Take 30 mg by mouth as needed. 06/02/23   Enedina Finner, MD    Physical Exam: Vitals:   09/19/23 1355 09/19/23 1357 09/19/23 1530 09/19/23 1749  BP:  (!) 116/104 114/74   Pulse:  (!) 102 73   Resp:  20    Temp:  97.7 F (36.5 C)  98 F (36.7 C)  TempSrc:  Oral  Oral  SpO2:  98% 94%   Weight: 69.4 kg     Height: 5\' 9"  (1.753 m)      General exam: awake, alert, no acute distress HEENT: atraumatic, clear conjunctiva, anicteric sclera, moist mucus membranes, hearing grossly normal  Respiratory system: CTAB slightly diminished right base, no wheezes, rales or rhonchi, normal respiratory effort. Cardiovascular system: normal S1/S2, RRR, +JVP, no murmurs, no pedal edema.   Gastrointestinal system: soft, NT, ND, no HSM felt, +bowel sounds. Central  nervous system: A&O x 3. no gross focal neurologic deficits, normal speech Extremities: moves all, no edema, normal tone Skin: dry, intact, normal temperature, normal color, No rashes, lesions or ulcers Psychiatry: normal mood, congruent affect, judgement and insight appear normal   Data Reviewed:  As above  Assessment and Plan:  * Acute on chronic HFrEF (heart failure with reduced ejection fraction) (HCC) Pt presents with dyspnea on exertion, BNP > 4500, chest x-ray with vascular congestion and small right pleural effusion.  No peripheral edema.  Prior Echo 05/25/2023 -- EF 35-40%, normal diastolic fx, moderately elevated PASP, mod-severe MR, mild-mod AR Sent to ED from Cardiology clinic. --Cardiology consulted --Started on IV Lasix in ED -  continue Lasix 40 mg IV BID for now --Strict Io's & daily weights --Repeat echo ordered for comparison to prior --Follow renal function & electrolytes daily  Elevated troponin Suspect demand ischemia due to CHF decompensation.  EKG no acute ischemic changes.  Pt denies current or recent chest pain. --Trend troponin to peak --Cardiology consulted  Essential hypertension BP's 116/104 >> 114/74 in the ED On IV Lasix 40 mg BID Hold home metop for acute CHF decompensation for now Per med hx - pt no longer takes spironolactone or losartan.  Hyperlipidemia Appears not on statin.  Benign prostatic hyperplasia with urinary obstruction Flomax sub'd for home silodosin/Rapaflo      Advance Care Planning: Code status DNR/DNI Confirmed code status with patient and his wife at bedside. They report both having DNR's and advanced directives at home. Pt confirms he would not want CPR, defibrillation, intubation or mechanical ventilation.  He requests DNR/DNI status in the event he should develop cardiac or respiratory arrest.  Consults: [facility]   Family Communication: Wife present at bedside during admission encounter.  Severity of  Illness: The appropriate patient status for this patient is INPATIENT. Inpatient status is judged to be reasonable and necessary in order to provide the required intensity of service to ensure the patient's safety. The patient's presenting symptoms, physical exam findings, and initial radiographic and laboratory data in the context of their chronic comorbidities is felt to place them at high risk for further clinical deterioration. Furthermore, it is not anticipated that the patient will be medically stable for discharge from the hospital within 2 midnights of admission.   * I certify that at the point of admission it is my clinical judgment that the patient will require inpatient hospital care spanning beyond 2 midnights from the point of admission due to high intensity of service, high risk for further deterioration and high frequency of surveillance required.*  Author: Pennie Banter, DO 09/19/2023 6:08 PM  For on call review [email or web address].

## 2023-09-19 NOTE — ED Notes (Signed)
This RN to bedside to introduce self to pt. Pt is resting comfortably. Pt advised he has been SOB for the last few weeks and had his NP squeeze him in today and they ran his labs and his BNP was elevated. So they sent him here. Pt advised the last time this happened we did a procedure to draw the fluid off through the back.

## 2023-09-19 NOTE — Assessment & Plan Note (Signed)
Appears not on statin 

## 2023-09-19 NOTE — ED Notes (Signed)
In and out catheter done and approximately 400 mL of clear, straw colored urine returned.

## 2023-09-20 DIAGNOSIS — I5023 Acute on chronic systolic (congestive) heart failure: Secondary | ICD-10-CM | POA: Diagnosis not present

## 2023-09-20 LAB — BASIC METABOLIC PANEL
Anion gap: 10 (ref 5–15)
BUN: 62 mg/dL — ABNORMAL HIGH (ref 8–23)
CO2: 24 mmol/L (ref 22–32)
Calcium: 8.5 mg/dL — ABNORMAL LOW (ref 8.9–10.3)
Chloride: 95 mmol/L — ABNORMAL LOW (ref 98–111)
Creatinine, Ser: 2.04 mg/dL — ABNORMAL HIGH (ref 0.61–1.24)
GFR, Estimated: 31 mL/min — ABNORMAL LOW (ref >60)
Glucose, Bld: 101 mg/dL — ABNORMAL HIGH (ref 70–99)
Potassium: 3.9 mmol/L (ref 3.5–5.1)
Sodium: 129 mmol/L — ABNORMAL LOW (ref 135–145)

## 2023-09-20 LAB — ECHOCARDIOGRAM COMPLETE
Area-P 1/2: 6.83 cm2
Height: 69 in
P 1/2 time: 521 ms
S' Lateral: 4.7 cm
Weight: 2448 [oz_av]

## 2023-09-20 LAB — GLUCOSE, CAPILLARY
Glucose-Capillary: 150 mg/dL — ABNORMAL HIGH (ref 70–99)
Glucose-Capillary: 132 mg/dL — ABNORMAL HIGH (ref 70–99)

## 2023-09-20 LAB — TROPONIN I (HIGH SENSITIVITY): Troponin I (High Sensitivity): 176 ng/L (ref <18)

## 2023-09-20 LAB — MAGNESIUM: Magnesium: 2.6 mg/dL — ABNORMAL HIGH (ref 1.7–2.4)

## 2023-09-20 MED ORDER — FUROSEMIDE 10 MG/ML IJ SOLN
80.0000 mg | Freq: Two times a day (BID) | INTRAMUSCULAR | Status: DC
  Administered 2023-09-20 – 2023-09-23 (×7): 80 mg via INTRAVENOUS
  Filled 2023-09-20 (×7): qty 8

## 2023-09-20 MED ORDER — FUROSEMIDE 10 MG/ML IJ SOLN: 80.0000 mg | Freq: Two times a day (BID) | INTRAMUSCULAR | Status: DC

## 2023-09-20 NOTE — ED Notes (Signed)
PT rang emergency call bell. 2 RN went into pt room immediately, pt was standing beside his bed.

## 2023-09-20 NOTE — ED Notes (Signed)
Answered call light, pt stated he has not had any sleep tonight, advised pt is getting ready to be transported to his room upstairs.

## 2023-09-20 NOTE — Progress Notes (Signed)
Heart Failure Stewardship Pharmacy Note  PCP: Lynnea Ferrier, MD PCP-Cardiologist: None  HPI: Brian Little is a 89 y.o. male with HFrEF (Echo in July EF 35-40%)  BPH, hypertension, bile acid associated diarrhea, PAD with claudication, remote history of eosinophilic myocarditis, former smoker who presented from clinic for concerns of decompensated heart failure. Reported worsening fatigue over the past few weeks and dyspnea on exertion. CXR 09/19/23 was suggestive of pulmonary vascular congestion. BNP on admission is significantly elevated to >4,500. Troponins were mildly elevated on admission, suggestive of demand ischemia.  Pertinent cardiac history: Cardiac MRI in 06/2019 and TTE in 12/2021 with normal LVEF. Carotid ultrasound 03/05/2023 revealed right ICA 60-79%, left ICA 1-39%. ABI's 03/05/23 revealed mild right lower extremity arterial disease and moderate left lower extremity arterial disease. 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 TTE 05/2023 noted LVEF of 35-40% with moderate-severe MR, mild to moderate AR. Holter monitor 05/2023 showed 23% PVC burden. Stress test 06/2023 showed LVEF of 27% with global hypokinesis and partially fixed anteroapical defect.   Pertinent Lab Values: Creatinine, Ser  Date Value Ref Range Status  09/20/2023 2.04 (H) 0.61 - 1.24 mg/dL Final   BUN  Date Value Ref Range Status  09/20/2023 62 (H) 8 - 23 mg/dL Final   Potassium  Date Value Ref Range Status  09/20/2023 3.9 3.5 - 5.1 mmol/L Final   Sodium  Date Value Ref Range Status  09/20/2023 129 (L) 135 - 145 mmol/L Final   B Natriuretic Peptide  Date Value Ref Range Status  09/19/2023 >4,500.0 (H) 0.0 - 100.0 pg/mL Final    Comment:    Performed at [facility], [address] [place], [place] [address]   Magnesium  Date Value Ref Range Status  09/20/2023 2.6 (H) 1.7 - 2.4 mg/dL Final    Comment:    Performed at [facility], [address] [place], [place] [address]   Hgb A1c MFr Bld  Date Value Ref Range Status  05/25/2023 5.2 4.8 - 5.6 % Final    Comment:    (NOTE) Pre diabetes:          5.7%-6.4%  Diabetes:              >6.4%  Glycemic control for   <7.0% adults with diabetes    LDH  Date Value Ref Range Status  08/12/2020 121 98 - 192 U/L Final    Comment:    Performed at [facility], [address] [place], [place] [address]    Vital Signs: Admission weight: 154.54 lbs Temp:  [97.5 F (36.4 C)-98 F (36.7 C)] 97.6 F (36.4 C) (10/31 0249) Pulse Rate:  [73-102] 87 (10/31 0249) Cardiac Rhythm: Heart block (10/31 0729) Resp:  [18-28] 18 (10/31 0249) BP: (105-122)/(71-104) 122/92 (10/31 0249) SpO2:  [92 %-100 %] 98 % (10/31 0249) Weight:  [69.4 kg (153 lb)-70.1 kg (154 lb 8.7 oz)] 70.1 kg (154 lb 8.7 oz) (10/31 0249)  Intake/Output Summary (Last 24 hours) at 09/20/2023 0748 Last data filed at 09/20/2023 0251 Gross per 24 hour  Intake 240 ml  Output --  Net 240 ml    Current Heart Failure Medications:  Loop diuretic: furosemide 40 gm IV q12h Beta-Blocker: none ACEI/ARB/ARNI: none MRA: none SGLT2i: none  Prior to admission Heart Failure Medications:  Loop diuretic: torsemide 40 mg daily (recently increased) Beta-Blocker: metoprolol succinate 12.5 mg daily ACEI/ARB/ARNI: losartan 12.5 mg daily (reported not taking) MRA: spironolactone 12.5 mg daily (  reported not taking) SGLT2i: none  Assessment: 1. Acute on chronic systolic heart failure (LVEF 35-40% previously, new echo pending) , due to NICM with some degree of ICM. NYHA class III symptoms.  -Symptoms: No shortness of breath at rest. Reports orthopnea. Still feeling fatigued. No notable LEE. -Volume: Vascular congestion noted on CXR report. BNP markedly elevated. Suspect very volume up. Presented with AKI which will hopefully improve with diuresis. -Hemodynamics: BP stable in 110-120s/70-80s. HR stable in 80s. Narrow QRS on EKG.   -BB: Would continue to hold home BB until diuresed.  -ACEI/ARB/ARNI: ARB on hold for AKI, can consider resuming when creatinine trends down. Has not tolerated Entresto previously due to cough.  -MRA: Held due to AKI. Will monitor creatinine.  -SGLT2i: Not ideal given nocturia may increase risk of adverse effects.   Plan: 1) Medication changes recommended at this time: -Consider increasing furosemide to 60 mg IV BID  2) Patient assistance: -Pending  3) Education: - Patient has been educated on current HF medications and potential additions to HF medication regimen - Patient verbalizes understanding that over the next few months, these medication doses may change and more medications may be added to optimize HF regimen - Patient has been educated on basic disease state pathophysiology and goals of therapy   Medication Assistance / Insurance Benefits Check: Does the patient have prescription insurance?  Yes  Type of insurance plan:  Does the patient qualify for medication assistance through manufacturers or grants? Pending   Outpatient Pharmacy: Prior to admission outpatient pharmacy: [facility]     Please do not hesitate to reach out with questions or concerns,  Enos Fling, PharmD, CPP, BCPS Heart Failure Pharmacist  Phone - [phone] 09/20/2023 10:12 AM

## 2023-09-20 NOTE — Progress Notes (Signed)
Transition of Care [facility]) - Inpatient Brief Assessment   Patient Details  Name: CROIX KLAVER MRN: [id number] Date of Birth: Jun 04, 1934  [facility] CM/SW Contact:    Truddie Hidden, RN Phone Number: 09/20/2023, 12:11 PM   Clinical Narrative: TOC continuing to follow patient's progress throughout discharge planning.    Transition of Care Asessment: Insurance and Status: Insurance coverage has been reviewed Patient has primary care physician: Yes Home environment has been reviewed: home Prior level of function:: independent Prior/Current Home Services: No current home services Social Determinants of Health Reivew: SDOH reviewed no interventions necessary Readmission risk has been reviewed: Yes Transition of care needs: no transition of care needs at this time

## 2023-09-20 NOTE — Consult Note (Signed)
IVC RV Basal diam:  4.50 cm    IVC diam: 2.20 cm RV S prime:     8.12 cm/s TAPSE (M-mode): 1.8 cm LEFT ATRIUM             Index        RIGHT ATRIUM           Index LA diam:        5.10 cm 2.77 cm/m   RA Area:     20.20 cm LA Vol (A2C):   65.6 ml 35.58 ml/m  RA Volume:   66.60 ml  36.12 ml/m LA Vol (A4C):   70.3 ml 38.13 ml/m LA Biplane Vol: 68.4 ml 37.10 ml/m  AORTIC VALVE LVOT Vmax:   48.37 cm/s LVOT Vmean:  30.900 cm/s LVOT VTI:    0.073 m AI PHT:      521 msec  AORTA Ao Root diam: 3.30 cm Ao Asc diam:  3.30 cm MITRAL VALVE               TRICUSPID VALVE MV Area (PHT): 6.83 cm    TR Peak grad:   43.3 mmHg MV Decel Time: 111 msec    TR Vmax:        329.00 cm/s MV E velocity: 89.35 cm/s MV A velocity: 52.15 cm/s  SHUNTS MV E/A ratio:  1.71        Systemic VTI:  0.07 m                             Systemic Diam: 2.10 cm Windell Norfolk Electronically signed by Windell Norfolk Signature Date/Time: 09/20/2023/10:32:48 AM    Final    DG Chest 2 View  Result Date: 09/19/2023 CLINICAL DATA:  Shortness of breath for couple of days. History of CHF and pleural effusion EXAM: CHEST - 2 VIEW COMPARISON:  05/30/2023 FINDINGS: Cardiac enlargement. Mild vascular congestion. Small right pleural effusion with basilar atelectasis, developing since prior study. Previous left pleural effusion has resolved. No pneumothorax. Calcification of the aorta. Degenerative changes in the spine and shoulders. IMPRESSION: 1. Cardiac enlargement with pulmonary vascular congestion. 2. Small right pleural effusion with basilar atelectasis. This could indicate pneumonia or compressive atelectasis. 3. Aortic atherosclerosis. Electronically Signed   By: Burman Nieves M.D.   On: 09/19/2023 16:17   VAS US CAROTID  Result Date: 09/10/2023 Carotid Arterial Duplex Study Patient Name:  MICK ACHTER  Date of Exam:   09/10/2023 Medical Rec #: [id number]        Accession #:    [id number] Date of Birth: 06/12/1934        Patient Gender: M Patient Age:   69 years Exam Location:  [facility] Procedure:      VAS US CAROTID Referring Phys: Coral Else --------------------------------------------------------------------------------  Indications:       Carotid artery disease. Comparison Study:  03/05/23 prior Performing Technologist: [place] Ponder RVS  Examination Guidelines: A complete evaluation includes B-mode imaging, spectral Doppler, color Doppler, and power Doppler as needed of all accessible portions of each vessel. Bilateral testing is considered an integral part of a complete examination. Limited examinations for reoccurring indications may be performed as noted.  Right Carotid Findings: +----------+--------+--------+--------+-------------------------+--------+           PSV cm/sEDV cm/sStenosisPlaque  Description       Comments +----------+--------+--------+--------+-------------------------+--------+ CCA Prox  49      13  No    Physically Abused: No    Sexually Abused: No    Family History  Problem Relation Age of Onset   Heart disease Father    Congestive Heart Failure Father    COPD Mother    Hypertension Mother      Vitals:   09/19/23 2326 09/20/23 0249 09/20/23 0926 09/20/23 1128  BP: 105/71 (!) 122/92 116/76 104/85  Pulse: 82 87 83 72  Resp: (!) 28 18 16 16   Temp: (!) 97.5 F (36.4 C) 97.6 F (36.4 C) 97.6 F (36.4 C) (!) 97.5 F (36.4 C)  TempSrc:      SpO2: 100% 98% 97% 100%  Weight:  70.1 kg    Height:        PHYSICAL EXAM General: Elderly appearing male, well nourished, in no acute distress sitting on the side of his hospital bed HEENT: Normocephalic and atraumatic. Neck: No JVD.   Lungs: Normal respiratory effort on RA. Clear bilaterally to auscultation. No wheezes, rhonchi. Bibasilar crackles  Heart: HRRR. Normal S1 and S2 without gallops or murmurs.  Abdomen: Non-distended appearing.  Msk: Normal strength and tone for age. Extremities: Warm and well perfused. No clubbing, cyanosis. No edema.  Neuro: Alert and oriented X 3. Psych: Answers questions appropriately.   Labs: Basic Metabolic Panel: Recent Labs    09/19/23 1404 09/20/23 0521  NA 129* 129*  K 4.0 3.9  CL 95* 95*  CO2 24 24  GLUCOSE 119*  101*  BUN 61* 62*  CREATININE 2.08* 2.04*  CALCIUM 8.3* 8.5*  MG  --  2.6*   Liver Function Tests: Recent Labs    09/19/23 1404  AST 35  ALT 22  ALKPHOS 136*  BILITOT 1.1  PROT 7.3  ALBUMIN 3.8   No results for input(s): "LIPASE", "AMYLASE" in the last 72 hours. CBC: No results for input(s): "WBC", "NEUTROABS", "HGB", "HCT", "MCV", "PLT" in the last 72 hours. Cardiac Enzymes: Recent Labs    09/19/23 1551 09/20/23 0521  TROPONINIHS 192* 176*   BNP: Recent Labs    09/19/23 0947  BNP >4,500.0*   D-Dimer: No results for input(s): "DDIMER" in the last 72 hours. Hemoglobin A1C: No results for input(s): "HGBA1C" in the last 72 hours. Fasting Lipid Panel: No results for input(s): "CHOL", "HDL", "LDLCALC", "TRIG", "CHOLHDL", "LDLDIRECT" in the last 72 hours. Thyroid Function Tests: No results for input(s): "TSH", "T4TOTAL", "T3FREE", "THYROIDAB" in the last 72 hours.  Invalid input(s): "FREET3" Anemia Panel: No results for input(s): "VITAMINB12", "FOLATE", "FERRITIN", "TIBC", "IRON", "RETICCTPCT" in the last 72 hours.   Radiology: ECHOCARDIOGRAM COMPLETE  Result Date: 09/20/2023    ECHOCARDIOGRAM REPORT   Patient Name:   DEAUNTA TERRASI Date of Exam: 09/19/2023 Medical Rec #:  [id number]       Height:       69.0 in Accession #:    [id number]      Weight:       153.0 lb Date of Birth:  1934/11/08       BSA:          1.844 m Patient Age:    89 years        BP:           105/79 mmHg Patient Gender: M               HR:           71 bpm. Exam Location:  [facility] Procedure: 2D Echo, Cardiac Doppler and Color Doppler Indications:  No    Physically Abused: No    Sexually Abused: No    Family History  Problem Relation Age of Onset   Heart disease Father    Congestive Heart Failure Father    COPD Mother    Hypertension Mother      Vitals:   09/19/23 2326 09/20/23 0249 09/20/23 0926 09/20/23 1128  BP: 105/71 (!) 122/92 116/76 104/85  Pulse: 82 87 83 72  Resp: (!) 28 18 16 16   Temp: (!) 97.5 F (36.4 C) 97.6 F (36.4 C) 97.6 F (36.4 C) (!) 97.5 F (36.4 C)  TempSrc:      SpO2: 100% 98% 97% 100%  Weight:  70.1 kg    Height:        PHYSICAL EXAM General: Elderly appearing male, well nourished, in no acute distress sitting on the side of his hospital bed HEENT: Normocephalic and atraumatic. Neck: No JVD.   Lungs: Normal respiratory effort on RA. Clear bilaterally to auscultation. No wheezes, rhonchi. Bibasilar crackles  Heart: HRRR. Normal S1 and S2 without gallops or murmurs.  Abdomen: Non-distended appearing.  Msk: Normal strength and tone for age. Extremities: Warm and well perfused. No clubbing, cyanosis. No edema.  Neuro: Alert and oriented X 3. Psych: Answers questions appropriately.   Labs: Basic Metabolic Panel: Recent Labs    09/19/23 1404 09/20/23 0521  NA 129* 129*  K 4.0 3.9  CL 95* 95*  CO2 24 24  GLUCOSE 119*  101*  BUN 61* 62*  CREATININE 2.08* 2.04*  CALCIUM 8.3* 8.5*  MG  --  2.6*   Liver Function Tests: Recent Labs    09/19/23 1404  AST 35  ALT 22  ALKPHOS 136*  BILITOT 1.1  PROT 7.3  ALBUMIN 3.8   No results for input(s): "LIPASE", "AMYLASE" in the last 72 hours. CBC: No results for input(s): "WBC", "NEUTROABS", "HGB", "HCT", "MCV", "PLT" in the last 72 hours. Cardiac Enzymes: Recent Labs    09/19/23 1551 09/20/23 0521  TROPONINIHS 192* 176*   BNP: Recent Labs    09/19/23 0947  BNP >4,500.0*   D-Dimer: No results for input(s): "DDIMER" in the last 72 hours. Hemoglobin A1C: No results for input(s): "HGBA1C" in the last 72 hours. Fasting Lipid Panel: No results for input(s): "CHOL", "HDL", "LDLCALC", "TRIG", "CHOLHDL", "LDLDIRECT" in the last 72 hours. Thyroid Function Tests: No results for input(s): "TSH", "T4TOTAL", "T3FREE", "THYROIDAB" in the last 72 hours.  Invalid input(s): "FREET3" Anemia Panel: No results for input(s): "VITAMINB12", "FOLATE", "FERRITIN", "TIBC", "IRON", "RETICCTPCT" in the last 72 hours.   Radiology: ECHOCARDIOGRAM COMPLETE  Result Date: 09/20/2023    ECHOCARDIOGRAM REPORT   Patient Name:   DEAUNTA TERRASI Date of Exam: 09/19/2023 Medical Rec #:  [id number]       Height:       69.0 in Accession #:    [id number]      Weight:       153.0 lb Date of Birth:  1934/11/08       BSA:          1.844 m Patient Age:    89 years        BP:           105/79 mmHg Patient Gender: M               HR:           71 bpm. Exam Location:  [facility] Procedure: 2D Echo, Cardiac Doppler and Color Doppler Indications:  No    Physically Abused: No    Sexually Abused: No    Family History  Problem Relation Age of Onset   Heart disease Father    Congestive Heart Failure Father    COPD Mother    Hypertension Mother      Vitals:   09/19/23 2326 09/20/23 0249 09/20/23 0926 09/20/23 1128  BP: 105/71 (!) 122/92 116/76 104/85  Pulse: 82 87 83 72  Resp: (!) 28 18 16 16   Temp: (!) 97.5 F (36.4 C) 97.6 F (36.4 C) 97.6 F (36.4 C) (!) 97.5 F (36.4 C)  TempSrc:      SpO2: 100% 98% 97% 100%  Weight:  70.1 kg    Height:        PHYSICAL EXAM General: Elderly appearing male, well nourished, in no acute distress sitting on the side of his hospital bed HEENT: Normocephalic and atraumatic. Neck: No JVD.   Lungs: Normal respiratory effort on RA. Clear bilaterally to auscultation. No wheezes, rhonchi. Bibasilar crackles  Heart: HRRR. Normal S1 and S2 without gallops or murmurs.  Abdomen: Non-distended appearing.  Msk: Normal strength and tone for age. Extremities: Warm and well perfused. No clubbing, cyanosis. No edema.  Neuro: Alert and oriented X 3. Psych: Answers questions appropriately.   Labs: Basic Metabolic Panel: Recent Labs    09/19/23 1404 09/20/23 0521  NA 129* 129*  K 4.0 3.9  CL 95* 95*  CO2 24 24  GLUCOSE 119*  101*  BUN 61* 62*  CREATININE 2.08* 2.04*  CALCIUM 8.3* 8.5*  MG  --  2.6*   Liver Function Tests: Recent Labs    09/19/23 1404  AST 35  ALT 22  ALKPHOS 136*  BILITOT 1.1  PROT 7.3  ALBUMIN 3.8   No results for input(s): "LIPASE", "AMYLASE" in the last 72 hours. CBC: No results for input(s): "WBC", "NEUTROABS", "HGB", "HCT", "MCV", "PLT" in the last 72 hours. Cardiac Enzymes: Recent Labs    09/19/23 1551 09/20/23 0521  TROPONINIHS 192* 176*   BNP: Recent Labs    09/19/23 0947  BNP >4,500.0*   D-Dimer: No results for input(s): "DDIMER" in the last 72 hours. Hemoglobin A1C: No results for input(s): "HGBA1C" in the last 72 hours. Fasting Lipid Panel: No results for input(s): "CHOL", "HDL", "LDLCALC", "TRIG", "CHOLHDL", "LDLDIRECT" in the last 72 hours. Thyroid Function Tests: No results for input(s): "TSH", "T4TOTAL", "T3FREE", "THYROIDAB" in the last 72 hours.  Invalid input(s): "FREET3" Anemia Panel: No results for input(s): "VITAMINB12", "FOLATE", "FERRITIN", "TIBC", "IRON", "RETICCTPCT" in the last 72 hours.   Radiology: ECHOCARDIOGRAM COMPLETE  Result Date: 09/20/2023    ECHOCARDIOGRAM REPORT   Patient Name:   DEAUNTA TERRASI Date of Exam: 09/19/2023 Medical Rec #:  [id number]       Height:       69.0 in Accession #:    [id number]      Weight:       153.0 lb Date of Birth:  1934/11/08       BSA:          1.844 m Patient Age:    89 years        BP:           105/79 mmHg Patient Gender: M               HR:           71 bpm. Exam Location:  [facility] Procedure: 2D Echo, Cardiac Doppler and Color Doppler Indications:  [facility] CLINIC CARDIOLOGY CONSULT NOTE       Patient ID: Brian Little MRN: [id number] DOB/AGE: 1933-12-29 89 y.o.  Admit date: 09/19/2023 Referring Physician Esaw Grandchild, MD Primary Physician Lynnea Ferrier, MD Primary Cardiologist Marijo Conception Reason for Consultation Acute on chronic HFrEF  HPI: Brian Little is a 89 y.o. male  with a past medical history of HFrEF (EF 20-25%, newly diagnosed on 05/2023), bilateral carotid stenosis, hyperlipidemia, hypertension, hx of eosinophilic myocarditis, PAD with bilateral claudication who presented to the ED on 09/19/2023 for worsening shortness of breath. Cardiology was consulted for further evaluation.   Patient has been experiencing worsening SOB over the last few weeks. Saw his primary cardiology provider on 10/30 and was sent to the ED for further evaluation given his symptoms as well as BNP >4500. Patient states he can't walk more than 50 feet without having SOB. Denied leg pain, LE edema, fever, chills, chest pain, or cough.   Work up in the ED notable for Na 129, K 4.0, Cr 2.08. Troponins 192 > 176. EKG showed SR with 1st degree AVB, LAD and PVCs at 89 bpm. CXR showed pulmonary vascular congestion, small right pleural effusion and aortic atherosclerosis. At ED triage, BP was 116/104 with HR 102 and stable SpO2 at 98%. Patient received 40 mg IV lasix in ED for diuresis. He did not require supplemental O2.   At the time of my evaluation this morning, patient was sitting up on the side of his hospital bed comfortably, in no acute distress. Patient states he's been having progressive shortness of breath on exertion and generalized fatigue for the past 6 weeks, significantly worse over the past 1.5 weeks. Patient states he was newly diagnosed with heart failure on 07/05 and was most recently readmitted to [facility] from 7/10 - 06/01/2023 due to worsening dyspnea and orthopnea. At that hospitalization in July, patient had thoracentesis removing  1.2 L of transudative fluid. Today, patient endorses similar symptoms with DOE and orthopnea. Patient reports he's been sleeping in his incliner most nights and says when he sits and rest after walking his SOB goes away in "seconds". Patient reports he's been taking his home torsemide, however he feels this does not make him urinate much more than his usual. Also states that the IV lasix he has been receiving has not increased his usual UOP. He had some urinary retention in the ED requiring temporary catheterization. Patient denies chest pain or LE swelling. Echo this admission reviewed with patient.   Review of systems complete and found to be negative unless listed above    Past Medical History:  Diagnosis Date   BPH (benign prostatic hyperplasia)    Cancer (HCC)    skin   Hypertension     Past Surgical History:  Procedure Laterality Date   ADENOIDECTOMY     89 yrs old   PARTIAL COLECTOMY     polyps/ appendix removed at same time   ROTATOR CUFF REPAIR     x 2, 2008, 2011   ROTATOR CUFF REPAIR     left and right   SHOULDER ARTHROSCOPY WITH OPEN ROTATOR CUFF REPAIR AND DISTAL CLAVICLE ACROMINECTOMY Left 10/31/2022   Procedure: SHOULDER ARTHROSCOPY WITH OPEN ROTATOR CUFF REPAIR AND DISTAL CLAVICLE ACROMINECTOMY;  Surgeon: Juanell Fairly, MD;  Location: [facility] ORS;  Service: Orthopedics;  Laterality: Left;   TONSILLECTOMY     89 years old    Medications Prior to Admission  Medication Sig Dispense Refill Last Dose  [facility] CLINIC CARDIOLOGY CONSULT NOTE       Patient ID: Brian Little MRN: [id number] DOB/AGE: 1933-12-29 89 y.o.  Admit date: 09/19/2023 Referring Physician Esaw Grandchild, MD Primary Physician Lynnea Ferrier, MD Primary Cardiologist Marijo Conception Reason for Consultation Acute on chronic HFrEF  HPI: Brian Little is a 89 y.o. male  with a past medical history of HFrEF (EF 20-25%, newly diagnosed on 05/2023), bilateral carotid stenosis, hyperlipidemia, hypertension, hx of eosinophilic myocarditis, PAD with bilateral claudication who presented to the ED on 09/19/2023 for worsening shortness of breath. Cardiology was consulted for further evaluation.   Patient has been experiencing worsening SOB over the last few weeks. Saw his primary cardiology provider on 10/30 and was sent to the ED for further evaluation given his symptoms as well as BNP >4500. Patient states he can't walk more than 50 feet without having SOB. Denied leg pain, LE edema, fever, chills, chest pain, or cough.   Work up in the ED notable for Na 129, K 4.0, Cr 2.08. Troponins 192 > 176. EKG showed SR with 1st degree AVB, LAD and PVCs at 89 bpm. CXR showed pulmonary vascular congestion, small right pleural effusion and aortic atherosclerosis. At ED triage, BP was 116/104 with HR 102 and stable SpO2 at 98%. Patient received 40 mg IV lasix in ED for diuresis. He did not require supplemental O2.   At the time of my evaluation this morning, patient was sitting up on the side of his hospital bed comfortably, in no acute distress. Patient states he's been having progressive shortness of breath on exertion and generalized fatigue for the past 6 weeks, significantly worse over the past 1.5 weeks. Patient states he was newly diagnosed with heart failure on 07/05 and was most recently readmitted to [facility] from 7/10 - 06/01/2023 due to worsening dyspnea and orthopnea. At that hospitalization in July, patient had thoracentesis removing  1.2 L of transudative fluid. Today, patient endorses similar symptoms with DOE and orthopnea. Patient reports he's been sleeping in his incliner most nights and says when he sits and rest after walking his SOB goes away in "seconds". Patient reports he's been taking his home torsemide, however he feels this does not make him urinate much more than his usual. Also states that the IV lasix he has been receiving has not increased his usual UOP. He had some urinary retention in the ED requiring temporary catheterization. Patient denies chest pain or LE swelling. Echo this admission reviewed with patient.   Review of systems complete and found to be negative unless listed above    Past Medical History:  Diagnosis Date   BPH (benign prostatic hyperplasia)    Cancer (HCC)    skin   Hypertension     Past Surgical History:  Procedure Laterality Date   ADENOIDECTOMY     89 yrs old   PARTIAL COLECTOMY     polyps/ appendix removed at same time   ROTATOR CUFF REPAIR     x 2, 2008, 2011   ROTATOR CUFF REPAIR     left and right   SHOULDER ARTHROSCOPY WITH OPEN ROTATOR CUFF REPAIR AND DISTAL CLAVICLE ACROMINECTOMY Left 10/31/2022   Procedure: SHOULDER ARTHROSCOPY WITH OPEN ROTATOR CUFF REPAIR AND DISTAL CLAVICLE ACROMINECTOMY;  Surgeon: Juanell Fairly, MD;  Location: [facility] ORS;  Service: Orthopedics;  Laterality: Left;   TONSILLECTOMY     89 years old    Medications Prior to Admission  Medication Sig Dispense Refill Last Dose  [facility] CLINIC CARDIOLOGY CONSULT NOTE       Patient ID: Brian Little MRN: [id number] DOB/AGE: 1933-12-29 89 y.o.  Admit date: 09/19/2023 Referring Physician Esaw Grandchild, MD Primary Physician Lynnea Ferrier, MD Primary Cardiologist Marijo Conception Reason for Consultation Acute on chronic HFrEF  HPI: Brian Little is a 89 y.o. male  with a past medical history of HFrEF (EF 20-25%, newly diagnosed on 05/2023), bilateral carotid stenosis, hyperlipidemia, hypertension, hx of eosinophilic myocarditis, PAD with bilateral claudication who presented to the ED on 09/19/2023 for worsening shortness of breath. Cardiology was consulted for further evaluation.   Patient has been experiencing worsening SOB over the last few weeks. Saw his primary cardiology provider on 10/30 and was sent to the ED for further evaluation given his symptoms as well as BNP >4500. Patient states he can't walk more than 50 feet without having SOB. Denied leg pain, LE edema, fever, chills, chest pain, or cough.   Work up in the ED notable for Na 129, K 4.0, Cr 2.08. Troponins 192 > 176. EKG showed SR with 1st degree AVB, LAD and PVCs at 89 bpm. CXR showed pulmonary vascular congestion, small right pleural effusion and aortic atherosclerosis. At ED triage, BP was 116/104 with HR 102 and stable SpO2 at 98%. Patient received 40 mg IV lasix in ED for diuresis. He did not require supplemental O2.   At the time of my evaluation this morning, patient was sitting up on the side of his hospital bed comfortably, in no acute distress. Patient states he's been having progressive shortness of breath on exertion and generalized fatigue for the past 6 weeks, significantly worse over the past 1.5 weeks. Patient states he was newly diagnosed with heart failure on 07/05 and was most recently readmitted to [facility] from 7/10 - 06/01/2023 due to worsening dyspnea and orthopnea. At that hospitalization in July, patient had thoracentesis removing  1.2 L of transudative fluid. Today, patient endorses similar symptoms with DOE and orthopnea. Patient reports he's been sleeping in his incliner most nights and says when he sits and rest after walking his SOB goes away in "seconds". Patient reports he's been taking his home torsemide, however he feels this does not make him urinate much more than his usual. Also states that the IV lasix he has been receiving has not increased his usual UOP. He had some urinary retention in the ED requiring temporary catheterization. Patient denies chest pain or LE swelling. Echo this admission reviewed with patient.   Review of systems complete and found to be negative unless listed above    Past Medical History:  Diagnosis Date   BPH (benign prostatic hyperplasia)    Cancer (HCC)    skin   Hypertension     Past Surgical History:  Procedure Laterality Date   ADENOIDECTOMY     89 yrs old   PARTIAL COLECTOMY     polyps/ appendix removed at same time   ROTATOR CUFF REPAIR     x 2, 2008, 2011   ROTATOR CUFF REPAIR     left and right   SHOULDER ARTHROSCOPY WITH OPEN ROTATOR CUFF REPAIR AND DISTAL CLAVICLE ACROMINECTOMY Left 10/31/2022   Procedure: SHOULDER ARTHROSCOPY WITH OPEN ROTATOR CUFF REPAIR AND DISTAL CLAVICLE ACROMINECTOMY;  Surgeon: Juanell Fairly, MD;  Location: [facility] ORS;  Service: Orthopedics;  Laterality: Left;   TONSILLECTOMY     89 years old    Medications Prior to Admission  Medication Sig Dispense Refill Last Dose  [facility] CLINIC CARDIOLOGY CONSULT NOTE       Patient ID: Brian Little MRN: [id number] DOB/AGE: 1933-12-29 89 y.o.  Admit date: 09/19/2023 Referring Physician Esaw Grandchild, MD Primary Physician Lynnea Ferrier, MD Primary Cardiologist Marijo Conception Reason for Consultation Acute on chronic HFrEF  HPI: Brian Little is a 89 y.o. male  with a past medical history of HFrEF (EF 20-25%, newly diagnosed on 05/2023), bilateral carotid stenosis, hyperlipidemia, hypertension, hx of eosinophilic myocarditis, PAD with bilateral claudication who presented to the ED on 09/19/2023 for worsening shortness of breath. Cardiology was consulted for further evaluation.   Patient has been experiencing worsening SOB over the last few weeks. Saw his primary cardiology provider on 10/30 and was sent to the ED for further evaluation given his symptoms as well as BNP >4500. Patient states he can't walk more than 50 feet without having SOB. Denied leg pain, LE edema, fever, chills, chest pain, or cough.   Work up in the ED notable for Na 129, K 4.0, Cr 2.08. Troponins 192 > 176. EKG showed SR with 1st degree AVB, LAD and PVCs at 89 bpm. CXR showed pulmonary vascular congestion, small right pleural effusion and aortic atherosclerosis. At ED triage, BP was 116/104 with HR 102 and stable SpO2 at 98%. Patient received 40 mg IV lasix in ED for diuresis. He did not require supplemental O2.   At the time of my evaluation this morning, patient was sitting up on the side of his hospital bed comfortably, in no acute distress. Patient states he's been having progressive shortness of breath on exertion and generalized fatigue for the past 6 weeks, significantly worse over the past 1.5 weeks. Patient states he was newly diagnosed with heart failure on 07/05 and was most recently readmitted to [facility] from 7/10 - 06/01/2023 due to worsening dyspnea and orthopnea. At that hospitalization in July, patient had thoracentesis removing  1.2 L of transudative fluid. Today, patient endorses similar symptoms with DOE and orthopnea. Patient reports he's been sleeping in his incliner most nights and says when he sits and rest after walking his SOB goes away in "seconds". Patient reports he's been taking his home torsemide, however he feels this does not make him urinate much more than his usual. Also states that the IV lasix he has been receiving has not increased his usual UOP. He had some urinary retention in the ED requiring temporary catheterization. Patient denies chest pain or LE swelling. Echo this admission reviewed with patient.   Review of systems complete and found to be negative unless listed above    Past Medical History:  Diagnosis Date   BPH (benign prostatic hyperplasia)    Cancer (HCC)    skin   Hypertension     Past Surgical History:  Procedure Laterality Date   ADENOIDECTOMY     89 yrs old   PARTIAL COLECTOMY     polyps/ appendix removed at same time   ROTATOR CUFF REPAIR     x 2, 2008, 2011   ROTATOR CUFF REPAIR     left and right   SHOULDER ARTHROSCOPY WITH OPEN ROTATOR CUFF REPAIR AND DISTAL CLAVICLE ACROMINECTOMY Left 10/31/2022   Procedure: SHOULDER ARTHROSCOPY WITH OPEN ROTATOR CUFF REPAIR AND DISTAL CLAVICLE ACROMINECTOMY;  Surgeon: Juanell Fairly, MD;  Location: [facility] ORS;  Service: Orthopedics;  Laterality: Left;   TONSILLECTOMY     89 years old    Medications Prior to Admission  Medication Sig Dispense Refill Last Dose

## 2023-09-20 NOTE — Plan of Care (Signed)
  Problem: Education: Goal: Ability to demonstrate management of disease process will improve Outcome: Progressing Goal: Ability to verbalize understanding of medication therapies will improve Outcome: Progressing Goal: Individualized Educational Video(s) Outcome: Progressing   Problem: Activity: Goal: Capacity to carry out activities will improve Outcome: Progressing   Problem: Cardiac: Goal: Ability to achieve and maintain adequate cardiopulmonary perfusion will improve Outcome: Progressing   Problem: Education: Goal: Knowledge of General Education information will improve Description: Including pain rating scale, medication(s)/side effects and non-pharmacologic comfort measures Outcome: Progressing   Problem: Health Behavior/Discharge Planning: Goal: Ability to manage health-related needs will improve Outcome: Progressing   Problem: Clinical Measurements: Goal: Ability to maintain clinical measurements within normal limits will improve Outcome: Progressing Goal: Will remain free from infection Outcome: Progressing Goal: Diagnostic test results will improve Outcome: Progressing Goal: Respiratory complications will improve Outcome: Progressing Goal: Cardiovascular complication will be avoided Outcome: Progressing   Problem: Activity: Goal: Risk for activity intolerance will decrease Outcome: Progressing   Problem: Nutrition: Goal: Adequate nutrition will be maintained Outcome: Progressing   Problem: Coping: Goal: Level of anxiety will decrease Outcome: Progressing   Problem: Elimination: Goal: Will not experience complications related to bowel motility Outcome: Progressing Goal: Will not experience complications related to urinary retention Outcome: Progressing   Problem: Pain Management: Goal: General experience of comfort will improve Outcome: Progressing   Problem: Safety: Goal: Ability to remain free from injury will improve Outcome: Progressing    Problem: Skin Integrity: Goal: Risk for impaired skin integrity will decrease Outcome: Progressing

## 2023-09-20 NOTE — Plan of Care (Signed)
  Problem: Education: Goal: Ability to demonstrate management of disease process will improve Outcome: Progressing   Problem: Education: Goal: Ability to verbalize understanding of medication therapies will improve Outcome: Progressing   

## 2023-09-21 ENCOUNTER — Other Ambulatory Visit (HOSPITAL_COMMUNITY): Payer: Self-pay

## 2023-09-21 DIAGNOSIS — I5023 Acute on chronic systolic (congestive) heart failure: Secondary | ICD-10-CM | POA: Diagnosis not present

## 2023-09-21 DIAGNOSIS — E871 Hypo-osmolality and hyponatremia: Secondary | ICD-10-CM | POA: Diagnosis present

## 2023-09-21 LAB — BASIC METABOLIC PANEL
Anion gap: 11 (ref 5–15)
BUN: 60 mg/dL — ABNORMAL HIGH (ref 8–23)
CO2: 23 mmol/L (ref 22–32)
Calcium: 8.3 mg/dL — ABNORMAL LOW (ref 8.9–10.3)
Chloride: 92 mmol/L — ABNORMAL LOW (ref 98–111)
Creatinine, Ser: 2 mg/dL — ABNORMAL HIGH (ref 0.61–1.24)
GFR, Estimated: 32 mL/min — ABNORMAL LOW (ref >60)
Glucose, Bld: 92 mg/dL (ref 70–99)
Potassium: 4.1 mmol/L (ref 3.5–5.1)
Sodium: 126 mmol/L — ABNORMAL LOW (ref 135–145)

## 2023-09-21 LAB — OSMOLALITY: Osmolality: 281 mosm/kg (ref 275–295)

## 2023-09-21 NOTE — Progress Notes (Signed)
[facility] Cardiology  CARDIOLOGY CONSULT NOTE  Patient ID: Brian Little MRN: [id number] DOB/AGE: 87-Sep-1935 87 y.o.  Admit date: 09/19/2023 Referring Physician Esaw Grandchild, MD Primary Physician Lynnea Ferrier, MD Primary Cardiologist Marijo Conception Reason for Consultation Acute on chronic HFrEF  HPI: 89 year old male who presented to hospital with worsening dyspnea, acute on chronic heart failure with reduced EF.  Today patient states that he breathing is somewhat improved.  I walked in hallway with him when he developed shortness of breath on exertion, was able to walk a little further distance than at home.  No chest pain/pressure  Review of systems complete and found to be negative unless listed above     Past Medical History:  Diagnosis Date   BPH (benign prostatic hyperplasia)    Cancer (HCC)    skin   Hypertension     Past Surgical History:  Procedure Laterality Date   ADENOIDECTOMY     89 yrs old   PARTIAL COLECTOMY     polyps/ appendix removed at same time   ROTATOR CUFF REPAIR     x 2, 2008, 2011   ROTATOR CUFF REPAIR     left and right   SHOULDER ARTHROSCOPY WITH OPEN ROTATOR CUFF REPAIR AND DISTAL CLAVICLE ACROMINECTOMY Left 10/31/2022   Procedure: SHOULDER ARTHROSCOPY WITH OPEN ROTATOR CUFF REPAIR AND DISTAL CLAVICLE ACROMINECTOMY;  Surgeon: Juanell Fairly, MD;  Location: [facility] ORS;  Service: Orthopedics;  Laterality: Left;   TONSILLECTOMY     89 years old    Medications Prior to Admission  Medication Sig Dispense Refill Last Dose   acetaminophen (TYLENOL) 500 MG tablet Take 500 mg by mouth every 6 (six) hours as needed.   unk   aspirin EC 81 MG tablet Take 1 tablet (81 mg total) by mouth daily. Swallow whole. (Patient taking differently: Take 81 mg by mouth daily as needed for mild pain (pain score 1-3) or moderate pain (pain score 4-6). Swallow whole.) 30 tablet 12 unk   cetirizine (ZYRTEC) 10 MG tablet 10 mg daily as needed for allergies.   unk   CREON  12000-38000 units CPEP capsule Take 3 capsules by mouth 3 (three) times daily before meals.   09/19/2023   escitalopram (LEXAPRO) 5 MG tablet Take 5 mg by mouth daily.   09/19/2023   fluticasone (FLONASE) 50 MCG/ACT nasal spray Place 1 spray into both nostrils daily as needed for allergies or rhinitis.   unk   metoprolol succinate (TOPROL-XL) 25 MG 24 hr tablet Take 1 tablet (25 mg total) by mouth daily. (Patient taking differently: Take 12.5 mg by mouth daily.) 30 tablet 0 09/19/2023   mirtazapine (REMERON) 15 MG tablet Take 7.5 mg by mouth at bedtime.   09/18/2023   silodosin (RAPAFLO) 4 MG CAPS capsule Take 1 capsule (4 mg total) by mouth daily with breakfast. 90 capsule 3 09/19/2023   tadalafil (CIALIS) 5 MG tablet Take 1 tablet (5 mg total) by mouth daily. (Patient taking differently: Take 5 mg by mouth daily as needed.) 90 tablet 3 unk   torsemide (DEMADEX) 20 MG tablet Take 1 tablet (20 mg total) by mouth daily. (Patient taking differently: Take 40 mg by mouth daily.) 30 tablet 1 09/19/2023   losartan (COZAAR) 25 MG tablet Take 12.5 mg by mouth daily. (Patient not taking: Reported on 09/19/2023)   Not Taking   spironolactone (ALDACTONE) 25 MG tablet Take 0.5 tablets (12.5 mg total) by mouth daily. (Patient not taking: Reported on 09/19/2023) 30 tablet 1  Not Taking   Social History   Socioeconomic History   Marital status: Married    Spouse name: Not on file   Number of children: Not on file   Years of education: Not on file   Highest education level: Not on file  Occupational History   Not on file  Tobacco Use   Smoking status: Former    Current packs/day: 0.00    Types: Cigarettes    Quit date: 01/10/1980    Years since quitting: 43.7   Smokeless tobacco: Never  Vaping Use   Vaping status: Never Used  Substance and Sexual Activity   Alcohol use: Never   Drug use: Never   Sexual activity: Yes    Birth control/protection: None  Other Topics Concern   Not on file  Social  History Narrative   Not on file   Social Determinants of Health   Financial Resource Strain: Not on file  Food Insecurity: No Food Insecurity (09/20/2023)   Hunger Vital Sign    Worried About Running Out of Food in the Last Year: Never true    Ran Out of Food in the Last Year: Never true  Transportation Needs: No Transportation Needs (09/20/2023)   PRAPARE - Administrator, Civil Service (Medical): No    Lack of Transportation (Non-Medical): No  Physical Activity: Not on file  Stress: Not on file  Social Connections: Not on file  Intimate Partner Violence: Not At Risk (09/20/2023)   Humiliation, Afraid, Rape, and Kick questionnaire    Fear of Current or Ex-Partner: No    Emotionally Abused: No    Physically Abused: No    Sexually Abused: No    Family History  Problem Relation Age of Onset   Heart disease Father    Congestive Heart Failure Father    COPD Mother    Hypertension Mother       Review of systems complete and found to be negative unless listed above      PHYSICAL EXAM  General: Well developed, well nourished, in no acute distress HEENT:  Normocephalic and atramatic Neck:  Positive JVD Lungs: Clear bilaterally to auscultation and percussion. Heart: +1 pedal edema.  Systolic murmur left lower sternal border. Labs:   Lab Results  Component Value Date   WBC 9.6 05/31/2023   HGB 13.1 05/31/2023   HCT 38.8 (L) 05/31/2023   MCV 85.5 05/31/2023   PLT 218 05/31/2023    Recent Labs  Lab 09/19/23 1404 09/20/23 0521 09/21/23 0354  NA 129*   < > 126*  K 4.0   < > 4.1  CL 95*   < > 92*  CO2 24   < > 23  BUN 61*   < > 60*  CREATININE 2.08*   < > 2.00*  CALCIUM 8.3*   < > 8.3*  PROT 7.3  --   --   BILITOT 1.1  --   --   ALKPHOS 136*  --   --   ALT 22  --   --   AST 35  --   --   GLUCOSE 119*   < > 92   < > = values in this interval not displayed.   Lab Results  Component Value Date   CKTOTAL 96 05/30/2023   TROPONINI <0.03 12/31/2018     Lab Results  Component Value Date   CHOL 141 05/25/2023   Lab Results  Component Value Date   HDL 57 05/25/2023   Lab  Results  Component Value Date   LDLCALC 70 05/25/2023   Lab Results  Component Value Date   TRIG 71 05/25/2023   Lab Results  Component Value Date   CHOLHDL 2.5 05/25/2023   No results found for: "LDLDIRECT"    Radiology: ECHOCARDIOGRAM COMPLETE  Result Date: 09/20/2023    ECHOCARDIOGRAM REPORT   Patient Name:   SUZANNE GARBERS Date of Exam: 09/19/2023 Medical Rec #:  [id number]       Height:       69.0 in Accession #:    [id number]      Weight:       153.0 lb Date of Birth:  30-Jan-1934       BSA:          1.844 m Patient Age:    89 years        BP:           105/79 mmHg Patient Gender: M               HR:           71 bpm. Exam Location:  [facility] Procedure: 2D Echo, Cardiac Doppler and Color Doppler Indications:     I50.21 Acute Systolic Heart Failure  History:         Patient has prior history of Echocardiogram examinations, most                  recent 05/25/2023. Risk Factors:Hypertension.  Sonographer:     Daphine Deutscher RDCS Referring Phys:  [id number] [facility]FF[facility] Diagnosing Phys: Mellody Drown Timithy Arons IMPRESSIONS  1. Left ventricular ejection fraction, by estimation, is 20 to 25%. The left ventricle has severely decreased function. The left ventricle demonstrates global hypokinesis. The left ventricular internal cavity size was mildly dilated. Left ventricular diastolic parameters are consistent with Grade III diastolic dysfunction (restrictive).  2. Right ventricular systolic function is mildly reduced. The right ventricular size is mildly enlarged. There is moderately elevated pulmonary artery systolic pressure. The estimated right ventricular systolic pressure is 55.3 mmHg.  3. Left atrial size was moderately dilated.  4. Right atrial size was mildly dilated.  5. Moderate mitral valve regurgitation.  6. Tricuspid valve regurgitation is mild to moderate.  7. The  aortic valve is tricuspid. There is mild thickening of the aortic valve. Aortic valve regurgitation is mild to moderate.  8. The inferior vena cava is dilated in size with <50% respiratory variability, suggesting right atrial pressure of 15 mmHg. FINDINGS  Left Ventricle: Left ventricular ejection fraction, by estimation, is 20 to 25%. The left ventricle has severely decreased function. The left ventricle demonstrates global hypokinesis. The left ventricular internal cavity size was mildly dilated. There is no left ventricular hypertrophy. Left ventricular diastolic parameters are consistent with Grade III diastolic dysfunction (restrictive). Right Ventricle: The right ventricular size is mildly enlarged. No increase in right ventricular wall thickness. Right ventricular systolic function is mildly reduced. There is moderately elevated pulmonary artery systolic pressure. The tricuspid regurgitant velocity is 3.29 m/s, and with an assumed right atrial pressure of 12 mmHg, the estimated right ventricular systolic pressure is 55.3 mmHg. Left Atrium: Left atrial size was moderately dilated. Right Atrium: Right atrial size was mildly dilated. Pericardium: There is no evidence of pericardial effusion. Mitral Valve: There is mild thickening of the mitral valve leaflet(s). Moderate mitral valve regurgitation. Tricuspid Valve: The tricuspid valve is normal in structure. Tricuspid valve regurgitation is mild to moderate. Aortic Valve: The aortic valve is tricuspid.  There is mild thickening of the aortic valve. Aortic valve regurgitation is mild to moderate. Aortic regurgitation PHT measures 521 msec. Pulmonic Valve: The pulmonic valve was not well visualized. Pulmonic valve regurgitation is trivial. Aorta: The aortic root and ascending aorta are structurally normal, with no evidence of dilitation. Venous: The inferior vena cava is dilated in size with less than 50% respiratory variability, suggesting right atrial pressure of  15 mmHg. IAS/Shunts: No atrial level shunt detected by color flow Doppler.  LEFT VENTRICLE PLAX 2D LVIDd:         5.20 cm   Diastology LVIDs:         4.70 cm   LV e' medial:    3.70 cm/s LV PW:         0.90 cm   LV E/e' medial:  24.1 LV IVS:        0.80 cm   LV e' lateral:   4.73 cm/s LVOT diam:     2.10 cm   LV E/e' lateral: 18.9 LV SV:         25 LV SV Index:   14 LVOT Area:     3.46 cm  RIGHT VENTRICLE            IVC RV Basal diam:  4.50 cm    IVC diam: 2.20 cm RV S prime:     8.12 cm/s TAPSE (M-mode): 1.8 cm LEFT ATRIUM             Index        RIGHT ATRIUM           Index LA diam:        5.10 cm 2.77 cm/m   RA Area:     20.20 cm LA Vol (A2C):   65.6 ml 35.58 ml/m  RA Volume:   66.60 ml  36.12 ml/m LA Vol (A4C):   70.3 ml 38.13 ml/m LA Biplane Vol: 68.4 ml 37.10 ml/m  AORTIC VALVE LVOT Vmax:   48.37 cm/s LVOT Vmean:  30.900 cm/s LVOT VTI:    0.073 m AI PHT:      521 msec  AORTA Ao Root diam: 3.30 cm Ao Asc diam:  3.30 cm MITRAL VALVE               TRICUSPID VALVE MV Area (PHT): 6.83 cm    TR Peak grad:   43.3 mmHg MV Decel Time: 111 msec    TR Vmax:        329.00 cm/s MV E velocity: 89.35 cm/s MV A velocity: 52.15 cm/s  SHUNTS MV E/A ratio:  1.71        Systemic VTI:  0.07 m                            Systemic Diam: 2.10 cm Windell Norfolk Electronically signed by Windell Norfolk Signature Date/Time: 09/20/2023/10:32:48 AM    Final    DG Chest 2 View  Result Date: 09/19/2023 CLINICAL DATA:  Shortness of breath for couple of days. History of CHF and pleural effusion EXAM: CHEST - 2 VIEW COMPARISON:  05/30/2023 FINDINGS: Cardiac enlargement. Mild vascular congestion. Small right pleural effusion with basilar atelectasis, developing since prior study. Previous left pleural effusion has resolved. No pneumothorax. Calcification of the aorta. Degenerative changes in the spine and shoulders. IMPRESSION: 1. Cardiac enlargement with pulmonary vascular congestion. 2. Small right pleural effusion with basilar  atelectasis. This could indicate pneumonia or compressive atelectasis.  3. Aortic atherosclerosis. Electronically Signed   By: Burman Nieves M.D.   On: 09/19/2023 16:17   VAS US CAROTID  Result Date: 09/10/2023 Carotid Arterial Duplex Study Patient Name:  RASHON WESTRUP  Date of Exam:   09/10/2023 Medical Rec #: [id number]        Accession #:    [id number] Date of Birth: 02/23/1934        Patient Gender: M Patient Age:   16 years Exam Location:  [facility] Procedure:      VAS US CAROTID Referring Phys: Coral Else --------------------------------------------------------------------------------  Indications:       Carotid artery disease. Comparison Study:  03/05/23 prior Performing Technologist: [place] Ponder RVS  Examination Guidelines: A complete evaluation includes B-mode imaging, spectral Doppler, color Doppler, and power Doppler as needed of all accessible portions of each vessel. Bilateral testing is considered an integral part of a complete examination. Limited examinations for reoccurring indications may be performed as noted.  Right Carotid Findings: +----------+--------+--------+--------+-------------------------+--------+           PSV cm/sEDV cm/sStenosisPlaque Description       Comments +----------+--------+--------+--------+-------------------------+--------+ CCA Prox  49      13              heterogenous                      +----------+--------+--------+--------+-------------------------+--------+ CCA Distal55      11              heterogenous                      +----------+--------+--------+--------+-------------------------+--------+ ICA Prox  317     83      60-79%  heterogenous and calcific         +----------+--------+--------+--------+-------------------------+--------+ ICA Mid   111     21                                                +----------+--------+--------+--------+-------------------------+--------+ ICA Distal49       15                                                +----------+--------+--------+--------+-------------------------+--------+ ECA       248             >50%                                      +----------+--------+--------+--------+-------------------------+--------+ +----------+--------+-------+--------+-------------------+           PSV cm/sEDV cmsDescribeArm Pressure (mmHG) +----------+--------+-------+--------+-------------------+ [id number]                                         +----------+--------+-------+--------+-------------------+ +---------+--------+--+--------+-+---------+ VertebralPSV cm/s50EDV cm/s8Antegrade +---------+--------+--+--------+-+---------+  Left Carotid Findings: +----------+--------+--------+--------+-------------------------+--------+           PSV cm/sEDV cm/sStenosisPlaque Description       Comments +----------+--------+--------+--------+-------------------------+--------+ CCA Prox  67      14  heterogenous                      +----------+--------+--------+--------+-------------------------+--------+ CCA Distal60      16              heterogenous                      +----------+--------+--------+--------+-------------------------+--------+ ICA Prox  153     24      1-39%   heterogenous and calcific         +----------+--------+--------+--------+-------------------------+--------+ ICA Mid   136     23                                                +----------+--------+--------+--------+-------------------------+--------+ ICA Distal61      24                                                +----------+--------+--------+--------+-------------------------+--------+ ECA       189                                                       +----------+--------+--------+--------+-------------------------+--------+ +----------+--------+--------+--------+-------------------+           PSV cm/sEDV  cm/sDescribeArm Pressure (mmHG) +----------+--------+--------+--------+-------------------+ [id number]                                          +----------+--------+--------+--------+-------------------+ +---------+--------+--+--------+--+---------+ VertebralPSV cm/s48EDV cm/s18Antegrade +---------+--------+--+--------+--+---------+   Summary: Right Carotid: Velocities in the right ICA are consistent with a 60-79%                stenosis. The ECA appears >50% stenosed. Left Carotid: Velocities in the left ICA are consistent with a 1-39% stenosis. Vertebrals:  Bilateral vertebral arteries demonstrate antegrade flow. Subclavians: Normal flow hemodynamics were seen in bilateral subclavian              arteries. *See table(s) above for measurements and observations.  Electronically signed by Coral Else MD on 09/10/2023 at 3:26:03 PM.    Final      ASSESSMENT AND PLAN:  Acute on chronic heart failure with reduced EF, LVEF 20 to 25%, not completely clear of etiology Frequent PVCs Moderate pulmonary hypertension Moderate MR/AR Mild flat type II troponin elevation Acute kidney injury Hypertension  Continue current IV diuresis with Lasix 80 mg twice daily.  Strict I's/O, daily weight.  Monitor renal function closely.  Plan for IV diuresis for next couple days as long as renal function remains stable/improved.  He would need higher dose of diuretic at discharge GDMT on hold due to soft blood pressure and acute kidney injury Initiate low-dose Toprol-XL 25 mg daily as blood pressure allows. Need close outpatient cardiology follow-up at discharge  Signed: Kathryne Gin MD,PhD, [facility] 09/21/2023, 7:21 AM

## 2023-09-21 NOTE — TOC Benefit Eligibility Note (Signed)
Patient Product/process development scientist completed.    The patient is insured through [facility]. Patient has Medicare and is not eligible for a copay card, but may be able to apply for patient assistance, if available.    Ran test claim for Farxiga 10 mg and the current 30 day co-pay is $45.00.  Ran test claim for Jardiance 10 mg and the current 30 day co-pay is $45.00.  This test claim was processed through [facility]- copay amounts may vary at other pharmacies due to pharmacy/plan contracts, or as the patient moves through the different stages of their insurance plan.     Roland Earl, CPHT Pharmacy Technician III Certified Patient Advocate [facility] Patient Advocate Team Direct Number: [phone]  Fax: [phone]

## 2023-09-21 NOTE — Care Management Important Message (Signed)
Important Message  Patient Details  Name: Brian Little MRN: [id number] Date of Birth: 1934/08/29   Important Message Given:  N/A - LOS <3 / Initial given by admissions     [name] 09/21/2023, 8:24 AM

## 2023-09-21 NOTE — Assessment & Plan Note (Signed)
Na 129 on admission, suspect hypervolemic given CHF decompensation. Pt has history of chronic hyponatremia as well, ranging 124-130 in recent 3-4 months. --Diuresis underway, mgmt of CHF as outlined --Follow BMP's daily --Nephrology consult & further evaluation if worsening

## 2023-09-21 NOTE — Progress Notes (Signed)
Progress Note   Patient: Brian Little UEA:[id number] DOB: [birth year] DOA: 09/19/2023     2 DOS: the patient was seen and examined on 09/21/2023   Brief hospital course: HPI on admission 09/19/2023: Brian Little is a 89 y.o. male with medical history significant of HFrEF (Echo in July EF 35-40%)  BPH, hypertension, bile acid associated diarrhea, PAD with claudication, remote history of eosinophilic myocarditis, former smoker who presents to the ED from Cardiology clinic today for concerns of decompensated HFrEF.  He reports progressively worsening dyspnea on exertion. Typically not dyspneic with activity, currently only able to ambulate about 50' before needing to rest to catch his breath.    BNP > 4500 and chest xray showing vascular congestion, small pleural effusion.  AKI noted with Cr 2.08 (baseline low 1's).  Pt was admitted for acute on chronic HFrEF with Cardiology consulted for further evaluation and management.    Assessment and Plan:  * Acute on chronic HFrEF (heart failure with reduced ejection fraction) (HCC) Pt presents with dyspnea on exertion, BNP > 4500, chest x-ray with vascular congestion and small right pleural effusion.  No peripheral edema.  Prior Echo 05/25/2023 -- EF 35-40%, normal diastolic fx, moderately elevated PASP, mod-severe MR, mild-mod AR Sent to ED from Cardiology clinic.  Echo this admission: EF 20-25%, grade 3 DD --Cardiology consulted --Continue IV Lasix to 80 mg BID --Strict Io's & daily weights --Follow renal function & electrolytes daily --Further GDMT per cardiology, as BP and renal function permit  Hyponatremia Na 129 on admission, suspect hypervolemic given CHF decompensation. --Diuresis underway, mgmt of CHF as outlined --Follow BMP's daily --Add on serum osm   Elevated troponin Demand ischemia due to CHF decompensation.  EKG no acute ischemic changes.  Pt denies current or recent chest pain.  Unlikely ACS. --Cardiology  consulted  Essential hypertension BP's 116/104 >> 114/74 in the ED On IV Lasix 40 mg BID Hold home metop for acute CHF decompensation for now Per med hx - pt no longer takes spironolactone or losartan.  Hyperlipidemia Appears not on statin.  Benign prostatic hyperplasia with urinary obstruction Flomax sub'd for home silodosin/Rapaflo        Subjective: Pt up in recliner, wife at bedside this AM.  He was moved to private room and reports he slept better last night.  He reports feeling better and asking to go home today.  After seeing Cardiologist, pt agreeable to stay for further IV diuresis.    Physical Exam: Vitals:   09/21/23 0100 09/21/23 0405 09/21/23 0814 09/21/23 1133  BP: 99/68 95/68 116/84 120/82  Pulse: 76 86 86   Resp: 16 16 18    Temp: 98 F (36.7 C) (!) 97.5 F (36.4 C) 97.6 F (36.4 C) (!) 97.5 F (36.4 C)  TempSrc: Oral Oral  Oral  SpO2: 96% 99% 96% 98%  Weight:      Height:       General exam: awake, alert, no acute distress HEENT: moist mucus membranes, hearing grossly normal  Respiratory system: on room air, normal respiratory effort. Cardiovascular system: RRR, no pedal edema.   Gastrointestinal system: soft, NT, ND Central nervous system: A&O x 3. no gross focal neurologic deficits, normal speech Extremities: moves all , no edema, normal tone Skin: dry, intact, normal temperature, normal color, No rashes, lesions or ulcers Psychiatry: normal mood, congruent affect, judgement and insight appear normal   Data Reviewed:  Notable labs ---   Na 129>>126, Cl 92, BUN 60, Cr 2.08 >>  2.04 >> 2.00   Family Communication: wife at bedside  Disposition: Status is: Inpatient Remains inpatient appropriate because: remains on IV diuresis   Planned Discharge Destination: Home    Time spent: 36 minutes  Author: Pennie Banter, DO 09/21/2023 1:57 PM  For on call review [email or web address].

## 2023-09-21 NOTE — Progress Notes (Signed)
Heart Failure Stewardship Pharmacy Note  PCP: Lynnea Ferrier, MD PCP-Cardiologist: None  HPI: Brian Little is a 89 y.o. male with HFrEF (Echo in July EF 35-40%)  BPH, hypertension, bile acid associated diarrhea, PAD with claudication, remote history of eosinophilic myocarditis, former smoker who presented from clinic for concerns of decompensated heart failure. Reported worsening fatigue over the past few weeks and dyspnea on exertion. CXR 09/19/23 was suggestive of pulmonary vascular congestion. BNP on admission is significantly elevated to >4,500. Troponins were mildly elevated on admission, suggestive of demand ischemia.  Pertinent cardiac history: Cardiac MRI in 06/2019 and TTE in 12/2021 with normal LVEF. Carotid ultrasound 03/05/2023 revealed right ICA 60-79%, left ICA 1-39%. ABI's 03/05/23 revealed mild right lower extremity arterial disease and moderate left lower extremity arterial disease. 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 TTE 05/2023 noted LVEF of 35-40% with moderate-severe MR, mild to moderate AR. Holter monitor 05/2023 showed 23% PVC burden. Stress test 06/2023 showed LVEF of 27% with global hypokinesis and partially fixed anteroapical defect.   Pertinent Lab Values: Creatinine, Ser  Date Value Ref Range Status  09/21/2023 2.00 (H) 0.61 - 1.24 mg/dL Final   BUN  Date Value Ref Range Status  09/21/2023 60 (H) 8 - 23 mg/dL Final   Potassium  Date Value Ref Range Status  09/21/2023 4.1 3.5 - 5.1 mmol/L Final   Sodium  Date Value Ref Range Status  09/21/2023 126 (L) 135 - 145 mmol/L Final   B Natriuretic Peptide  Date Value Ref Range Status  09/19/2023 >4,500.0 (H) 0.0 - 100.0 pg/mL Final    Comment:    Performed at [facility], [address] [place], [place] [address]   Magnesium  Date Value Ref Range Status  09/20/2023 2.6 (H) 1.7 - 2.4 mg/dL Final    Comment:    Performed at [facility], [address] [place], [place] [address]   Hgb A1c MFr Bld  Date Value Ref Range Status  05/25/2023 5.2 4.8 - 5.6 % Final    Comment:    (NOTE) Pre diabetes:          5.7%-6.4%  Diabetes:              >6.4%  Glycemic control for   <7.0% adults with diabetes    LDH  Date Value Ref Range Status  08/12/2020 121 98 - 192 U/L Final    Comment:    Performed at [facility], [address] [place], [place] [address]    Vital Signs: Admission weight: 154.54 lbs Temp:  [97.5 F (36.4 C)-98 F (36.7 C)] 97.6 F (36.4 C) (11/01 0814) Pulse Rate:  [66-86] 86 (11/01 0814) Cardiac Rhythm: Normal sinus rhythm (11/01 1014) Resp:  [16-18] 18 (11/01 0814) BP: (95-116)/(68-85) 116/84 (11/01 0814) SpO2:  [96 %-100 %] 96 % (11/01 0814)  Intake/Output Summary (Last 24 hours) at 09/21/2023 1033 Last data filed at 09/21/2023 0900 Gross per 24 hour  Intake --  Output 1375 ml  Net -1375 ml    Current Heart Failure Medications:  Loop diuretic: furosemide 80 mg IV q12h Beta-Blocker: none ACEI/ARB/ARNI: none MRA: none SGLT2i: none  Prior to admission Heart Failure Medications:  Loop diuretic: torsemide 40 mg daily (recently increased) Beta-Blocker: metoprolol succinate 12.5 mg daily ACEI/ARB/ARNI: losartan 12.5 mg daily (reported not taking) MRA: spironolactone 12.5 mg daily (reported not taking) SGLT2i: none  Assessment: 1. Acute on chronic systolic heart failure (LVEF 35-40% previously, new  echo pending) , due to NICM with some degree of ICM. NYHA class III symptoms.  -Symptoms: No shortness of breath at rest. Reports improved shortness of breath and orthopnea. No notable LEE. -Volume: Vascular congestion noted on CXR report. BNP markedly elevated on admission. Presented with AKI, creatinine has been stable with diuresis. Given symptoms resolution, likely approaching euvolemia. Received 80 mg IV furosemide this morning, can consider transitioning to the recently increased home dose oral  torsemide tomorrow. SGLT2i would also help with diuresis. -Hemodynamics: BP stable, with 2 low readings overnight. HR stable in 80s.   -BB: Can consider resuming low dose beta blocker -ACEI/ARB/ARNI: ARB on hold for AKI, given BP on the lower end, would opt to resume outpatient after clinic follow-up -MRA: Held due to AKI. Would not recommend resuming at this time.  -SGLT2i: Given progressive CKD and hypervolemia prior to admission, can consider a trial of [place]. Has nocturia but no recent UTIs.  Plan: 1) Medication changes recommended at this time: -Consider transitioning to oral torsemide 40 mg daily tomorrow -Consider starting Farxiga 10 mg daily for HF and CKD (eGFR 32) -Can consider resuming low dose beta blocker on discharge  2) Patient assistance: -Farxiga and Jardiance copay $45  3) Education: - Patient has been educated on current HF medications and potential additions to HF medication regimen - Patient verbalizes understanding that over the next few months, these medication doses may change and more medications may be added to optimize HF regimen - Patient has been educated on basic disease state pathophysiology and goals of therapy  Medication Assistance / Insurance Benefits Check: Does the patient have prescription insurance?  Yes  Type of insurance plan:  Does the patient qualify for medication assistance through manufacturers or grants? Pending   Outpatient Pharmacy: Prior to admission outpatient pharmacy: [facility]   Is the patient willing to utilize a [facility] pharmacy at discharge?: Yes Please do not hesitate to reach out with questions or concerns,  Enos Fling, PharmD, CPP, BCPS Heart Failure Pharmacist  Phone - [phone] 09/21/2023 10:33 AM

## 2023-09-22 DIAGNOSIS — I5023 Acute on chronic systolic (congestive) heart failure: Secondary | ICD-10-CM | POA: Diagnosis not present

## 2023-09-22 LAB — BASIC METABOLIC PANEL
Anion gap: 12 (ref 5–15)
BUN: 55 mg/dL — ABNORMAL HIGH (ref 8–23)
CO2: 23 mmol/L (ref 22–32)
Calcium: 8.3 mg/dL — ABNORMAL LOW (ref 8.9–10.3)
Chloride: 90 mmol/L — ABNORMAL LOW (ref 98–111)
Creatinine, Ser: 1.79 mg/dL — ABNORMAL HIGH (ref 0.61–1.24)
GFR, Estimated: 36 mL/min — ABNORMAL LOW (ref >60)
Glucose, Bld: 107 mg/dL — ABNORMAL HIGH (ref 70–99)
Potassium: 3.1 mmol/L — ABNORMAL LOW (ref 3.5–5.1)
Sodium: 125 mmol/L — ABNORMAL LOW (ref 135–145)

## 2023-09-22 MED ORDER — ISOSORBIDE MONONITRATE ER 30 MG PO TB24
15.0000 mg | ORAL_TABLET | Freq: Every day | ORAL | Status: DC
  Administered 2023-09-22 – 2023-09-23 (×2): 15 mg via ORAL
  Filled 2023-09-22 (×2): qty 1

## 2023-09-22 MED ORDER — HYDRALAZINE HCL 10 MG PO TABS
10.0000 mg | ORAL_TABLET | Freq: Two times a day (BID) | ORAL | Status: DC
  Administered 2023-09-22: 10 mg via ORAL
  Filled 2023-09-22: qty 1

## 2023-09-22 MED ORDER — MIRTAZAPINE 15 MG PO TABS
15.0000 mg | ORAL_TABLET | Freq: Every day | ORAL | Status: DC
  Administered 2023-09-22 – 2023-09-23 (×2): 15 mg via ORAL
  Filled 2023-09-22 (×2): qty 1

## 2023-09-22 MED ORDER — POTASSIUM CHLORIDE CRYS ER 20 MEQ PO TBCR
40.0000 meq | EXTENDED_RELEASE_TABLET | ORAL | Status: AC
  Administered 2023-09-22 (×2): 40 meq via ORAL
  Filled 2023-09-22 (×2): qty 2

## 2023-09-22 MED ORDER — MIDODRINE HCL 5 MG PO TABS
5.0000 mg | ORAL_TABLET | Freq: Three times a day (TID) | ORAL | Status: DC
  Administered 2023-09-22 – 2023-09-24 (×6): 5 mg via ORAL
  Filled 2023-09-22 (×6): qty 1

## 2023-09-22 NOTE — Progress Notes (Signed)
[facility]    SUBJECTIVE: Patient still has dyspnea shortness of breath as well as anxiety difficulty sleeping still has lower extremity edema denies any chest pain   Vitals:   09/21/23 2112 09/22/23 0029 09/22/23 0448 09/22/23 0754  BP: 93/80 120/82 (!) 119/91 127/69  Pulse: 71 84 76 76  Resp: 18 18 18 16   Temp: (!) 96 F (35.6 C) (!) 97.5 F (36.4 C) 97.6 F (36.4 C) 97.8 F (36.6 C)  TempSrc: Axillary Oral Oral   SpO2: 98% 96% 99% 100%  Weight:      Height:         Intake/Output Summary (Last 24 hours) at 09/22/2023 1101 Last data filed at 09/22/2023 0754 Gross per 24 hour  Intake 240 ml  Output 825 ml  Net -585 ml      PHYSICAL EXAM  General: Well developed, well nourished, in no acute distress HEENT:  Normocephalic and atramatic Neck:  No JVD.  Lungs: Clear bilaterally to auscultation and percussion. Heart: HRRR . Normal S1 and S2 without gallops or murmurs.  Abdomen: Bowel sounds are positive, abdomen soft and non-tender  Msk:  Back normal, normal gait. Normal strength and tone for age. Extremities: No clubbing, cyanosis or edema.   Neuro: Alert and oriented X 3. Psych:  Good affect, responds appropriately   LABS: Basic Metabolic Panel: Recent Labs    09/20/23 0521 09/21/23 0354 09/22/23 0543  NA 129* 126* 125*  K 3.9 4.1 3.1*  CL 95* 92* 90*  CO2 24 23 23   GLUCOSE 101* 92 107*  BUN 62* 60* 55*  CREATININE 2.04* 2.00* 1.79*  CALCIUM 8.5* 8.3* 8.3*  MG 2.6*  --   --    Liver Function Tests: Recent Labs    09/19/23 1404  AST 35  ALT 22  ALKPHOS 136*  BILITOT 1.1  PROT 7.3  ALBUMIN 3.8   No results for input(s): "LIPASE", "AMYLASE" in the last 72 hours. CBC: No results for input(s): "WBC", "NEUTROABS", "HGB", "HCT", "MCV", "PLT" in the last 72 hours. Cardiac Enzymes: No results for input(s): "CKTOTAL", "CKMB", "CKMBINDEX", "TROPONINI" in the last 72 hours. BNP: Invalid input(s): "POCBNP" D-Dimer: No results for input(s): "DDIMER"  in the last 72 hours. Hemoglobin A1C: No results for input(s): "HGBA1C" in the last 72 hours. Fasting Lipid Panel: No results for input(s): "CHOL", "HDL", "LDLCALC", "TRIG", "CHOLHDL", "LDLDIRECT" in the last 72 hours. Thyroid Function Tests: No results for input(s): "TSH", "T4TOTAL", "T3FREE", "THYROIDAB" in the last 72 hours.  Invalid input(s): "FREET3" Anemia Panel: No results for input(s): "VITAMINB12", "FOLATE", "FERRITIN", "TIBC", "IRON", "RETICCTPCT" in the last 72 hours.  No results found.   Echo mildly depressed left ventricle function EF around 25 to 30%  TELEMETRY: Normal sinus rhythm rate of around 75 nonspecific ST-T wave changes:  ASSESSMENT AND PLAN:  Principal Problem:   Acute on chronic HFrEF (heart failure with reduced ejection fraction) (HCC) Active Problems:   Benign prostatic hyperplasia with urinary obstruction   Hyperlipidemia   Essential hypertension   Elevated troponin   Hyponatremia    Plan Cardiomyopathy Acute on chronic systolic congestive heart failure continue aggressive medical therapy for heart failure management For patient to heart failure clinic on discharge for further management Support stockings elevation for lower extremity edema Continue statin therapy for hyperlipidemia Flat troponins consistent with demand ischemia Extremely high BNP will continue aggressive heart failure management Chronic renal insufficiency stage III recommend referral to nephrology for further management maintain adequate perfusion and hydration Recommend starting  Imdur hydralazine for cardiomyopathy heart failure management Concern for renal insufficiency we have held off on ACE ARB or Arni and spironolactone Consider adding Farxiga to help with cardiomyopathy and heart failure  Alwyn Pea, MD 09/22/2023 11:01 AM

## 2023-09-22 NOTE — Progress Notes (Signed)
Progress Note   Patient: Brian Little ZOX:[id number] DOB: 16-Dec-1933 DOA: 09/19/2023     3 DOS: the patient was seen and examined on 09/22/2023   Brief hospital course: HPI on admission 09/19/2023: Brian Little is a 89 y.o. male with medical history significant of HFrEF (Echo in July EF 35-40%)  BPH, hypertension, bile acid associated diarrhea, PAD with claudication, remote history of eosinophilic myocarditis, former smoker who presents to the ED from Cardiology clinic today for concerns of decompensated HFrEF.  He reports progressively worsening dyspnea on exertion. Typically not dyspneic with activity, currently only able to ambulate about 50' before needing to rest to catch his breath.    BNP > 4500 and chest xray showing vascular congestion, small pleural effusion.  AKI noted with Cr 2.08 (baseline low 1's).  Pt was admitted for acute on chronic HFrEF with Cardiology consulted for further evaluation and management.    Assessment and Plan:  * Acute on chronic HFrEF (heart failure with reduced ejection fraction) (HCC) Pt presents with dyspnea on exertion, BNP > 4500, chest x-ray with vascular congestion and small right pleural effusion.  No peripheral edema.  Prior Echo 05/25/2023 -- EF 35-40%, normal diastolic fx, moderately elevated PASP, mod-severe MR, mild-mod AR Sent to ED from Cardiology clinic.  Echo this admission: EF 20-25%, grade 3 DD --Cardiology consulted --Continue IV Lasix to 80 mg BID --Strict Io's & daily weights --Follow renal function & electrolytes daily --Further GDMT per cardiology, as BP and renal function permit  Hyponatremia Na 129 on admission, suspect hypervolemic given CHF decompensation. Pt has history of chronic hyponatremia as well, ranging 124-130 in recent 3-4 months. --Diuresis underway, mgmt of CHF as outlined --Follow BMP's daily --Nephrology consult & further evaluation if worsening  Elevated troponin Demand ischemia due to CHF  decompensation.  EKG no acute ischemic changes.  Pt denies current or recent chest pain.  Unlikely ACS. --Cardiology consulted  Essential hypertension BP's 116/104 >> 114/74 in the ED On IV Lasix 40 mg BID Hold home metop for acute CHF decompensation for now Per med hx - pt no longer takes spironolactone or losartan.  Hyperlipidemia Appears not on statin.  Benign prostatic hyperplasia with urinary obstruction Flomax sub'd for home silodosin/Rapaflo        Subjective: Pt up in recliner, wife son and daughter-in-law at bedside this afternoon.  Pt reprots feeling overall well, improved.  Still some SOB on exertion but slowly improving.  He had some knee buckling when up with RN today, agreeable to see PT.  No other acute complaints.     Physical Exam: Vitals:   09/22/23 0448 09/22/23 0754 09/22/23 1240 09/22/23 1541  BP: (!) 119/91 127/69 105/81 111/86  Pulse: 76 76 (!) 56 69  Resp: 18 16 20 20   Temp: 97.6 F (36.4 C) 97.8 F (36.6 C) (!) 97.5 F (36.4 C) (!) 97.5 F (36.4 C)  TempSrc: Oral  Oral Oral  SpO2: 99% 100% 100% 100%  Weight:      Height:       General exam: awake, alert, no acute distress HEENT: moist mucus membranes, hearing grossly normal  Respiratory system: on room air, normal respiratory effort. Cardiovascular system: RRR, no pedal edema.   Central nervous system: A&O x 4. no gross focal neurologic deficits, normal speech Extremities: moves all, no edema, normal tone Skin: dry, intact, no rashes seen on visualized skin Psychiatry: normal mood, congruent affect, judgement and insight appear normal    Data Reviewed:  Notable  labs ---   Na 129>>126 >> 125 Cl 90, BUN 60>>55,  Cr 2.08 >> 2.04 >> 2.00 >> 1.79 Ca 8.3  ECHO 09/19/23 --- EF 20-25%, grade III DD, LV global hypokinesis, mod MR, mild-mod TR  Family Communication: wife at bedside  Disposition: Status is: Inpatient Remains inpatient appropriate because: remains on IV diuresis,  hyponatremia   Planned Discharge Destination: Home    Time spent: 50 minutes including time at bedside, and in coordination of care with staff and consultants, including phone call with patient's psychiatrist regarding recent medication changes.   Author: Pennie Banter, DO 09/22/2023 4:23 PM  For on call review [email or web address].

## 2023-09-23 DIAGNOSIS — I5023 Acute on chronic systolic (congestive) heart failure: Secondary | ICD-10-CM | POA: Diagnosis not present

## 2023-09-23 LAB — BASIC METABOLIC PANEL
Anion gap: 11 (ref 5–15)
BUN: 50 mg/dL — ABNORMAL HIGH (ref 8–23)
CO2: 22 mmol/L (ref 22–32)
Calcium: 8.4 mg/dL — ABNORMAL LOW (ref 8.9–10.3)
Chloride: 92 mmol/L — ABNORMAL LOW (ref 98–111)
Creatinine, Ser: 1.62 mg/dL — ABNORMAL HIGH (ref 0.61–1.24)
GFR, Estimated: 41 mL/min — ABNORMAL LOW (ref >60)
Glucose, Bld: 88 mg/dL (ref 70–99)
Potassium: 3.6 mmol/L (ref 3.5–5.1)
Sodium: 125 mmol/L — ABNORMAL LOW (ref 135–145)

## 2023-09-23 LAB — CBC
HCT: 32.2 % — ABNORMAL LOW (ref 39.0–52.0)
Hemoglobin: 10.8 g/dL — ABNORMAL LOW (ref 13.0–17.0)
MCH: 27.5 pg (ref 26.0–34.0)
MCHC: 33.5 g/dL (ref 30.0–36.0)
MCV: 81.9 fL (ref 80.0–100.0)
Platelets: 204 10*3/uL (ref 150–400)
RBC: 3.93 MIL/uL — ABNORMAL LOW (ref 4.22–5.81)
RDW: 18.7 % — ABNORMAL HIGH (ref 11.5–15.5)
WBC: 6.4 10*3/uL (ref 4.0–10.5)
nRBC: 0 % (ref 0.0–0.2)

## 2023-09-23 LAB — BRAIN NATRIURETIC PEPTIDE: B Natriuretic Peptide: 3003.9 pg/mL — ABNORMAL HIGH (ref 0.0–100.0)

## 2023-09-23 LAB — MAGNESIUM: Magnesium: 2.4 mg/dL (ref 1.7–2.4)

## 2023-09-23 MED ORDER — HYDRALAZINE HCL 25 MG PO TABS: 25.0000 mg | ORAL_TABLET | Freq: Two times a day (BID) | ORAL | Status: DC

## 2023-09-23 MED ORDER — TORSEMIDE 20 MG PO TABS
20.0000 mg | ORAL_TABLET | Freq: Two times a day (BID) | ORAL | Status: DC
  Administered 2023-09-23 – 2023-09-24 (×2): 20 mg via ORAL
  Filled 2023-09-23 (×2): qty 1

## 2023-09-23 MED ORDER — POTASSIUM CHLORIDE CRYS ER 20 MEQ PO TBCR
40.0000 meq | EXTENDED_RELEASE_TABLET | Freq: Once | ORAL | Status: AC
  Administered 2023-09-23: 40 meq via ORAL
  Filled 2023-09-23: qty 2

## 2023-09-23 MED ORDER — ENOXAPARIN SODIUM 40 MG/0.4ML IJ SOSY
40.0000 mg | PREFILLED_SYRINGE | INTRAMUSCULAR | Status: DC
  Administered 2023-09-23: 40 mg via SUBCUTANEOUS
  Filled 2023-09-23: qty 0.4

## 2023-09-23 MED ORDER — ISOSORBIDE MONONITRATE ER 30 MG PO TB24
30.0000 mg | ORAL_TABLET | Freq: Every day | ORAL | Status: DC
  Filled 2023-09-23: qty 1

## 2023-09-23 NOTE — Progress Notes (Signed)
Progress Note   Patient: Brian Little:[id number] DOB: 1934/06/30 DOA: 09/19/2023     4 DOS: the patient was seen and examined on 09/23/2023   Brief hospital course: HPI on admission 09/19/2023: Brian Little is a 89 y.o. male with medical history significant of HFrEF (Echo in July EF 35-40%)  BPH, hypertension, bile acid associated diarrhea, PAD with claudication, remote history of eosinophilic myocarditis, former smoker who presents to the ED from Cardiology clinic today for concerns of decompensated HFrEF.  He reports progressively worsening dyspnea on exertion. Typically not dyspneic with activity, currently only able to ambulate about 50' before needing to rest to catch his breath.    BNP > 4500 and chest xray showing vascular congestion, small pleural effusion.  AKI noted with Cr 2.08 (baseline low 1's).  Pt was admitted for acute on chronic HFrEF with Cardiology consulted for further evaluation and management.    Assessment and Plan:  * Acute on chronic HFrEF (heart failure with reduced ejection fraction) (HCC) Pt presents with dyspnea on exertion, BNP > 4500, chest x-ray with vascular congestion and small right pleural effusion.  No peripheral edema.  Prior Echo 05/25/2023 -- EF 35-40%, normal diastolic fx, moderately elevated PASP, mod-severe MR, mild-mod AR Sent to ED from Cardiology clinic.  Echo this admission: EF 20-25%, grade 3 DD  Diuresing well.  Renal function improving. Net IO Since Admission: -4,455 mL [09/23/23 1600]  --Cardiology consulted --Continue IV Lasix to 80 mg BID --Cardiology added low dose hydralazine and Imdur --On midodrine for BP to tolerate diuresis --Strict Io's & daily weights --Follow renal function & electrolytes daily --Further GDMT per cardiology, as BP and renal function permit  Hyponatremia Na 129 on admission, suspect hypervolemic given CHF decompensation. Pt has history of chronic hyponatremia as well, ranging 124-130 in recent 3-4  months. --Diuresis underway, mgmt of CHF as outlined --Follow BMP's daily --Nephrology consult & further evaluation if worsening  Elevated troponin Demand ischemia due to CHF decompensation.  EKG no acute ischemic changes.  Pt denies current or recent chest pain.  Unlikely ACS. --Cardiology consulted  Essential hypertension BP's 116/104 >> 114/74 in the ED On IV Lasix 40 mg BID Hold home metop for acute CHF decompensation for now Per med hx - pt no longer takes spironolactone or losartan.  Hyperlipidemia Appears not on statin.  Benign prostatic hyperplasia with urinary obstruction Flomax sub'd for home silodosin/Rapaflo        Subjective: Pt up in chair with wife at bedside today.  He reports feeling well, denies complaints.   Feels better than he has in a long time.  Ambulated a lap around the unit earlier and did not get short of breath.      Physical Exam: Vitals:   09/23/23 0049 09/23/23 0432 09/23/23 0756 09/23/23 1259  BP: 99/60 113/64 102/73 103/63  Pulse: (!) 53 78 79 78  Resp: 18 18 20 20   Temp: 97.7 F (36.5 C) (!) 97.5 F (36.4 C) 97.7 F (36.5 C) 97.9 F (36.6 C)  TempSrc: Oral Oral Oral Oral  SpO2: 98% 100% 100% 95%  Weight:   73.2 kg   Height:       General exam: awake, alert, no acute distress HEENT: moist mucus membranes, hearing grossly normal  Respiratory system: on room air, normal respiratory effort. CTAB no wheezes or rhonchi Cardiovascular system: RRR, no pedal edema.   Central nervous system: A&O x 4. no gross focal neurologic deficits, normal speech Extremities: moves all, no  edema, normal tone Skin: dry, intact, no rashes seen on visualized skin Psychiatry: normal mood, congruent affect, judgement and insight appear normal    Data Reviewed:  Notable labs ---   Na 129>>126 >> 125 >> 125 Cl 90 >> 92 BUN 60>>55 >> 50 Cr 2.08 >> 2.04 >> 2.00 >> 1.79 >> 1.62 Ca 8.4 Hbg 10.8 stable  BNP >4500 >> 3003.9  ECHO 09/19/23 --- EF  20-25%, grade III DD, LV global hypokinesis, mod MR, mild-mod TR  Family Communication: wife at bedside  Disposition: Status is: Inpatient Remains inpatient appropriate because: remains on IV diuresis, hyponatremia   Planned Discharge Destination: Home    Time spent: 35 minutes    Author: Pennie Banter, DO 09/23/2023 4:00 PM  For on call review [email or web address].

## 2023-09-23 NOTE — Evaluation (Signed)
Physical Therapy Evaluation Patient Details Name: Brian Little MRN: [id number] DOB: 08/19/1934 Today's Date: 09/23/2023  History of Present Illness  Patient is a 89 y.o. male with medical history significant of HFrEF (Echo in July EF 35-40%)  BPH, hypertension, bile acid associated diarrhea, PAD with claudication, remote history of eosinophilic myocarditis, former smoker who presents to the ED from Cardiology clinic today for concerns of decompensated HFrEF.  He reports progressively worsening dyspnea on exertion. Current MD assessment: Acute on chronic HFrEF, Hyponatremia, Elevated troponin, and HLD.  Clinical Impression  Pt was pleasant and motivated to participate during the session and put forth good effort throughout. He requires no assist for all mobility performed today. Able to perform STS's with SPC, showing good control during ascent/ descent. Performed graded ambulatory bouts x3 with final bout up to pt's max comfortable distance. Final bout was ~160 feet with SPC and CGA, final bout pt reports some slight SOB but HR ad SpO2 WFL on RA; Pt walking with NBOS, slight imbalance noted but no LOB or shift outside BOS, VC's provided to walk with slightly wider steps but little to no change noted. Pt and pt's wife educated on energy conservation techniques when walking to promote frequent mobilization while also promoting safety. Pt will benefit from continued PT services upon discharge to safely address deficits listed in patient problem list for decreased caregiver assistance and eventual return to PLOF.          If plan is discharge home, recommend the following: A little help with walking and/or transfers;A little help with bathing/dressing/bathroom;Assist for transportation;Help with stairs or ramp for entrance   Can travel by private vehicle        Equipment Recommendations None recommended by PT  Recommendations for Other Services       Functional Status Assessment Patient has  had a recent decline in their functional status and demonstrates the ability to make significant improvements in function in a reasonable and predictable amount of time.     Precautions / Restrictions Precautions Precautions: Fall Restrictions Weight Bearing Restrictions: No      Mobility  Bed Mobility               General bed mobility comments: pt in chair at start/end of session    Transfers Overall transfer level: Modified independent Equipment used: Straight cane                    Ambulation/Gait Ambulation/Gait assistance: Contact guard assist Gait Distance (Feet): 20 Feet x1, 70 feet x, 160 feet x1 Assistive device: Straight cane Gait Pattern/deviations: Narrow base of support Gait velocity: slightly decreased     General Gait Details: pt able to perofrm graded ambulatory bouts with final bout performed at pt's max comfortable distance. NBOS noted with some slight imbalance seen, but no LOB or stumbling. Generally steady steps, mild balance defecits seen with sharp turns or head turns, VC's provided to walk with slightly wider steps and pace himself appropriately. Pt reporting slight SOB towards end of final bout, HR and SpO2 were WFL on RA throughout session.   Stairs            Wheelchair Mobility     Tilt Bed    Modified Rankin (Stroke Patients Only)       Balance Overall balance assessment: Needs assistance   Sitting balance-Leahy Scale: Normal     Standing balance support: Single extremity supported, During functional activity Standing balance-Leahy Scale: Good Standing balance comment:  dynamic gait with SPC                             Pertinent Vitals/Pain Pain Assessment Pain Assessment: No/denies pain    Home Living Family/patient expects to be discharged to:: Private residence Living Arrangements: Spouse/significant other Available Help at Discharge: Family;Available 24 hours/day Type of Home: House Home  Access: Stairs to enter Entrance Stairs-Rails: Right;Left (can not reach both) Entrance Stairs-Number of Steps: 6   Home Layout: One level Home Equipment: Grab bars - tub/shower;Grab bars - toilet;Rolling Walker (2 wheels);Cane - single point [facility] = hurrycane)      Prior Function Prior Level of Function : Independent/Modified Independent             Mobility Comments: per pt report he is able to walk household distances comfortablly, limited community amb recently 2/2 SOB. denies any recent falls ADLs Comments: Ind with showers and bathroom use     Extremity/Trunk Assessment   Upper Extremity Assessment Upper Extremity Assessment: Overall WFL for tasks assessed    Lower Extremity Assessment Lower Extremity Assessment: Overall WFL for tasks assessed       Communication   Communication Communication: No apparent difficulties  Cognition Arousal: Alert Behavior During Therapy: WFL for tasks assessed/performed Overall Cognitive Status: Within Functional Limits for tasks assessed                                          General Comments      Exercises Other Exercises Other Exercises: Education on beenfits of frequent mobilzation in order to maintain functional ability while also providing education to pt and pt's wife on energy conservaiton techniques to promote safety when mobilizing. Both verbalized understanding   Assessment/Plan    PT Assessment Patient needs continued PT services  PT Problem List Decreased strength;Decreased coordination;Decreased range of motion;Decreased activity tolerance;Decreased balance;Decreased mobility       PT Treatment Interventions DME instruction;Balance training;Gait training;Stair training;Functional mobility training;Therapeutic activities;Therapeutic exercise    PT Goals (Current goals can be found in the Care Plan section)  Acute Rehab PT Goals Patient Stated Goal: get home, walk more PT Goal Formulation:  With patient Time For Goal Achievement: 10/06/23 Potential to Achieve Goals: Good    Frequency Min 1X/week     Co-evaluation               AM-PAC PT "6 Clicks" Mobility  Outcome Measure Help needed turning from your back to your side while in a flat bed without using bedrails?: None Help needed moving from lying on your back to sitting on the side of a flat bed without using bedrails?: None Help needed moving to and from a bed to a chair (including a wheelchair)?: A Little Help needed standing up from a chair using your arms (e.g., wheelchair or bedside chair)?: A Little Help needed to walk in hospital room?: A Little Help needed climbing 3-5 steps with a railing? : A Little 6 Click Score: 20    End of Session Equipment Utilized During Treatment: Gait belt Activity Tolerance: Patient tolerated treatment well Patient left: in chair;with family/visitor present;with call bell/phone within reach Nurse Communication: Mobility status (nursing cleared to have pt seated without chair alarm) PT Visit Diagnosis: Other abnormalities of gait and mobility (R26.89);Difficulty in walking, not elsewhere classified (R26.2)    Time: 8295-6213 PT  Time Calculation (min) (ACUTE ONLY): 32 min   Charges:                 Cecile Sheerer, SPT 09/23/23, 12:41 PM

## 2023-09-23 NOTE — Progress Notes (Signed)
[facility] Cardiology    SUBJECTIVE: Patient feels much improved less short of breath less dyspnea less weakness was able to sleep.  Patient feels much improved but states he may be ready to be discharged by tomorrow go home   Vitals:   09/23/23 0049 09/23/23 0432 09/23/23 0756 09/23/23 1259  BP: 99/60 113/64 102/73 103/63  Pulse: (!) 53 78 79 78  Resp: 18 18 20 20   Temp: 97.7 F (36.5 C) (!) 97.5 F (36.4 C) 97.7 F (36.5 C) 97.9 F (36.6 C)  TempSrc: Oral Oral Oral Oral  SpO2: 98% 100% 100% 95%  Weight:   73.2 kg   Height:         Intake/Output Summary (Last 24 hours) at 09/23/2023 1305 Last data filed at 09/23/2023 1032 Gross per 24 hour  Intake --  Output 2075 ml  Net -2075 ml      PHYSICAL EXAM  General: Well developed, well nourished, in no acute distress HEENT:  Normocephalic and atramatic Neck:  No JVD.  Lungs: Clear bilaterally to auscultation and percussion. Heart: HRRR . Normal S1 and S2 without gallops or murmurs.  Abdomen: Bowel sounds are positive, abdomen soft and non-tender  Msk:  Back normal, normal gait. Normal strength and tone for age. Extremities: No clubbing, cyanosis or edema.   Neuro: Alert and oriented X 3. Psych:  Good affect, responds appropriately   LABS: Basic Metabolic Panel: Recent Labs    09/22/23 0543 09/23/23 0442  NA 125* 125*  K 3.1* 3.6  CL 90* 92*  CO2 23 22  GLUCOSE 107* 88  BUN 55* 50*  CREATININE 1.79* 1.62*  CALCIUM 8.3* 8.4*  MG  --  2.4   Liver Function Tests: No results for input(s): "AST", "ALT", "ALKPHOS", "BILITOT", "PROT", "ALBUMIN" in the last 72 hours. No results for input(s): "LIPASE", "AMYLASE" in the last 72 hours. CBC: Recent Labs    09/23/23 0442  WBC 6.4  HGB 10.8*  HCT 32.2*  MCV 81.9  PLT 204   Cardiac Enzymes: No results for input(s): "CKTOTAL", "CKMB", "CKMBINDEX", "TROPONINI" in the last 72 hours. BNP: Invalid input(s): "POCBNP" D-Dimer: No results for input(s): "DDIMER" in the last  72 hours. Hemoglobin A1C: No results for input(s): "HGBA1C" in the last 72 hours. Fasting Lipid Panel: No results for input(s): "CHOL", "HDL", "LDLCALC", "TRIG", "CHOLHDL", "LDLDIRECT" in the last 72 hours. Thyroid Function Tests: No results for input(s): "TSH", "T4TOTAL", "T3FREE", "THYROIDAB" in the last 72 hours.  Invalid input(s): "FREET3" Anemia Panel: No results for input(s): "VITAMINB12", "FOLATE", "FERRITIN", "TIBC", "IRON", "RETICCTPCT" in the last 72 hours.  No results found.   Echo echo ejection fraction 25 to 30%  TELEMETRY: Normal sinus rhythm rate around 70:  ASSESSMENT AND PLAN:  Principal Problem:   Acute on chronic HFrEF (heart failure with reduced ejection fraction) (HCC) Active Problems:   Benign prostatic hyperplasia with urinary obstruction   Hyperlipidemia   Essential hypertension   Elevated troponin   Hyponatremia    Plan Acute on chronic systolic congestive heart failure improved continue current therapy including diuretics Renal insufficiency stage III continue to follow renal function referred to nephrology GFR 32 Recommend Imdur hydralazine for cardiomyopathy advanced dose to Imdur 30 daily hydralazine 25 .  Twice daily Renal insufficiency consider switching to ACE ARB or Entresto if renal function allows Consider adding Farxiga for heart failure management Increase activity ambulate in the hall physical therapy Port stockings elevation for lower extremity edema Recommend to heart failure clinic this week at  [facility] heart failure November 5 1:30 Consider discharge tomorrow referral to heart failure clinic on Tuesday Follow-up with general cardiology 2 to 4 weeks   Alwyn Pea, MD 09/23/2023 1:05 PM

## 2023-09-23 NOTE — Progress Notes (Signed)
PHARMACIST - PHYSICIAN COMMUNICATION  CONCERNING: Enoxaparin (Lovenox) for DVT Prophylaxis   RECOMMENDATION: Patient was receiving enoxaparin 30 mg q24 hours for VTE prophylaxis.   Filed Weights   09/19/23 1355 09/20/23 0249 09/23/23 0756  Weight: 69.4 kg (153 lb) 70.1 kg (154 lb 8.7 oz) 73.2 kg (161 lb 4.8 oz)   Estimated Creatinine Clearance: 32.1 mL/min (A) (by C-G formula based on SCr of 1.62 mg/dL (H)).  Patient is candidate for enoxaparin 40 mg every 24 hours based on CrCl  > 30 mL/min.   DESCRIPTION: Pharmacy has adjusted enoxaparin dose per [facility] policy.  Patient is now receiving enoxaparin 40 mg every 24 hours.   Littie Deeds, PharmD Pharmacy Resident  09/23/2023 2:10 PM

## 2023-09-23 NOTE — Progress Notes (Signed)
       CROSS COVER NOTE  NAME: RIYAN HAILE MRN: [id number] DOB : 30-Jan-1934    Concern as stated by nurse / staff   Patient has an order for 10mg  hydralazine at 0500 but he also has an order for midodrine 5mg  TID that was added by Cardiology on 11/2. Admitted for CHF exacerbation. Hx of CHF (EF 20-25%), HTN, remote history of eosinophilic myocarditis. Last BP was 113/64.       Pertinent findings on chart review:   Assessment and  Interventions   Assessment: Vitals:   09/22/23 1541 09/22/23 2020 09/23/23 0049 09/23/23 0432  BP: 111/86 106/70 99/60 113/64  Pulse: 69 78 (!) 53 78  Temp: (!) 97.5 F (36.4 C) 97.6 F (36.4 C) 97.7 F (36.5 C) (!) 97.5 F (36.4 C)  Resp: 20 20 18 18   Height:      Weight:      SpO2: 100% 97% 98% 100%  TempSrc: Oral Oral Oral Oral  BMI (Calculated):        Plan: Hold meds  until clarified with day team       Donnie Mesa NP [facility] Cross Cover 7pm-7am - check amion for availability Pager [phone]

## 2023-09-24 ENCOUNTER — Other Ambulatory Visit: Payer: Self-pay

## 2023-09-24 DIAGNOSIS — I5023 Acute on chronic systolic (congestive) heart failure: Secondary | ICD-10-CM | POA: Diagnosis not present

## 2023-09-24 LAB — BASIC METABOLIC PANEL
Anion gap: 10 (ref 5–15)
BUN: 41 mg/dL — ABNORMAL HIGH (ref 8–23)
CO2: 22 mmol/L (ref 22–32)
Calcium: 8.5 mg/dL — ABNORMAL LOW (ref 8.9–10.3)
Chloride: 94 mmol/L — ABNORMAL LOW (ref 98–111)
Creatinine, Ser: 1.54 mg/dL — ABNORMAL HIGH (ref 0.61–1.24)
GFR, Estimated: 43 mL/min — ABNORMAL LOW (ref >60)
Glucose, Bld: 96 mg/dL (ref 70–99)
Potassium: 4.2 mmol/L (ref 3.5–5.1)
Sodium: 126 mmol/L — ABNORMAL LOW (ref 135–145)

## 2023-09-24 MED ORDER — METOPROLOL SUCCINATE ER 25 MG PO TB24
12.5000 mg | ORAL_TABLET | Freq: Every day | ORAL | 1 refills | Status: DC
  Filled 2023-09-24: qty 15, 30d supply, fill #0

## 2023-09-24 MED ORDER — DAPAGLIFLOZIN PROPANEDIOL 5 MG PO TABS
5.0000 mg | ORAL_TABLET | Freq: Every day | ORAL | 1 refills | Status: DC
  Filled 2023-09-24 (×2): qty 30, 30d supply, fill #0

## 2023-09-24 MED ORDER — ASPIRIN 81 MG PO TBEC: 81.0000 mg | DELAYED_RELEASE_TABLET | Freq: Every day | ORAL | Status: DC

## 2023-09-24 MED ORDER — DAPAGLIFLOZIN PROPANEDIOL 5 MG PO TABS
5.0000 mg | ORAL_TABLET | Freq: Every day | ORAL | Status: DC
  Administered 2023-09-24: 5 mg via ORAL
  Filled 2023-09-24: qty 1

## 2023-09-24 MED ORDER — METOPROLOL SUCCINATE ER 25 MG PO TB24
12.5000 mg | ORAL_TABLET | Freq: Every day | ORAL | Status: DC
  Administered 2023-09-24: 12.5 mg via ORAL
  Filled 2023-09-24: qty 1

## 2023-09-24 MED ORDER — MIRTAZAPINE 15 MG PO TABS: 15.0000 mg | ORAL_TABLET | Freq: Every day | ORAL | Status: DC

## 2023-09-24 MED ORDER — MIDODRINE HCL 5 MG PO TABS
5.0000 mg | ORAL_TABLET | Freq: Three times a day (TID) | ORAL | 1 refills | Status: DC
  Filled 2023-09-24: qty 80, 27d supply, fill #0

## 2023-09-24 MED ORDER — TORSEMIDE 20 MG PO TABS
20.0000 mg | ORAL_TABLET | Freq: Two times a day (BID) | ORAL | 1 refills | Status: DC
  Filled 2023-09-24: qty 60, 30d supply, fill #0

## 2023-09-24 NOTE — Care Management Important Message (Signed)
Important Message  Patient Details  Name: Brian Little MRN: [id number] Date of Birth: 03/13/1934   Important Message Given:  Yes - Medicare IM     [name] [name] 09/24/2023, 1:03 PM

## 2023-09-24 NOTE — Progress Notes (Signed)
Patient and wife educated on AVS including follow up visits, medication changes and when to take, pharmacy delivered new meds to bedside and informed refills would come from cardiology or primary that would then be sent to his regular pharmacy.  All questions answered and RX detailed meds given to wife.

## 2023-09-24 NOTE — Progress Notes (Signed)
Heart Failure Stewardship Pharmacy Note  PCP: Lynnea Ferrier, MD PCP-Cardiologist: None  HPI: Brian Little is a 89 y.o. male with HFrEF (Echo in July EF 35-40%)  BPH, hypertension, bile acid associated diarrhea, PAD with claudication, remote history of eosinophilic myocarditis, former smoker who presented from clinic for concerns of decompensated heart failure. Reported worsening fatigue over the past few weeks and dyspnea on exertion. CXR 09/19/23 was suggestive of pulmonary vascular congestion. BNP on admission is significantly elevated to >4,500. Troponins were mildly elevated on admission, suggestive of demand ischemia. Patient is diuresed with improvement in renal function and symptoms. Preparing for potential discharge today.  Pertinent cardiac history: Cardiac MRI in 06/2019 and TTE in 12/2021 with normal LVEF. Carotid ultrasound 03/05/2023 revealed right ICA 60-79%, left ICA 1-39%. ABI's 03/05/23 revealed mild right lower extremity arterial disease and moderate left lower extremity arterial disease. 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 TTE 05/2023 noted LVEF of 35-40% with moderate-severe MR, mild to moderate AR. Holter monitor 05/2023 showed 23% PVC burden. Stress test 06/2023 showed LVEF of 27% with global hypokinesis and partially fixed anteroapical defect.   Pertinent Lab Values: Creatinine, Ser  Date Value Ref Range Status  09/24/2023 1.54 (H) 0.61 - 1.24 mg/dL Final   BUN  Date Value Ref Range Status  09/24/2023 41 (H) 8 - 23 mg/dL Final   Potassium  Date Value Ref Range Status  09/24/2023 4.2 3.5 - 5.1 mmol/L Final   Sodium  Date Value Ref Range Status  09/24/2023 126 (L) 135 - 145 mmol/L Final   B Natriuretic Peptide  Date Value Ref Range Status  09/23/2023 3,003.9 (H) 0.0 - 100.0 pg/mL Final    Comment:    Performed at [facility], [address] [place], [place] [address]   Magnesium  Date Value Ref Range Status   09/23/2023 2.4 1.7 - 2.4 mg/dL Final    Comment:    Performed at [facility], [address] [place], [place] [address]   Hgb A1c MFr Bld  Date Value Ref Range Status  05/25/2023 5.2 4.8 - 5.6 % Final    Comment:    (NOTE) Pre diabetes:          5.7%-6.4%  Diabetes:              >6.4%  Glycemic control for   <7.0% adults with diabetes    LDH  Date Value Ref Range Status  08/12/2020 121 98 - 192 U/L Final    Comment:    Performed at [facility], [address] [place], [place] [address]    Vital Signs: Admission weight: 154.54 lbs Temp:  [97.4 F (36.3 C)-98.3 F (36.8 C)] 97.9 F (36.6 C) (11/04 0803) Pulse Rate:  [68-81] 69 (11/04 0803) Cardiac Rhythm: Normal sinus rhythm (11/03 2000) Resp:  [15-20] 15 (11/04 0803) BP: (99-123)/(57-73) 110/64 (11/04 0803) SpO2:  [95 %-100 %] 100 % (11/04 0803)  Intake/Output Summary (Last 24 hours) at 09/24/2023 0809 Last data filed at 09/23/2023 1824 Gross per 24 hour  Intake --  Output 1125 ml  Net -1125 ml    Current Heart Failure Medications:  Loop diuretic: none Beta-Blocker: none ACEI/ARB/ARNI: none MRA: none SGLT2i: none Other: hydralazine/isosorbide, midodrine 5 mg TID  Prior to admission Heart Failure Medications:  Loop diuretic: torsemide 40 mg daily (recently increased) Beta-Blocker: metoprolol succinate 12.5 mg daily ACEI/ARB/ARNI: losartan 12.5 mg daily (reported not taking) MRA: spironolactone 12.5 mg daily (reported not  taking) SGLT2i: none  Assessment: 1. Acute on chronic systolic heart failure (LVEF 35-40% previously, new echo pending), due to NICM with some degree of ICM. NYHA class II symptoms.  -Symptoms: No shortness of breath at rest. Reports improved shortness of breath and denies orthopnea. Mild LEE present on exam. -Volume: BNP markedly elevated on admission. Presented with AKI, creatinine has been stable with diuresis. Diuresed with improvement in creatinine.   -Hemodynamics: BP low stable on midodrine. HR stable in 60-70s. Tele with ectopy this weekend. -BB: Can consider resuming low dose beta blocker today. -ACEI/ARB/ARNI: ARB on hold for AKI, given BP on the lower end, would opt to resume outpatient after clinic follow-up when able to stop midpodrine. -MRA: Held due to AKI. Would not recommend resuming at this time.  -SGLT2i: Given progressive CKD and hypervolemia prior to admission, can consider [place]. Has nocturia but no recent UTIs and is able to control his bladder.   Plan: 1) Medication changes recommended at this time: -Consider torsemide 40 mg daily at discharge -Consider starting Farxiga 10 mg daily for HF and CKD (eGFR 43) -Can consider resuming low dose beta blocker   2) Patient assistance: -Farxiga and Jardiance copay $45  3) Education: - Patient has been educated on current HF medications and potential additions to HF medication regimen - Patient verbalizes understanding that over the next few months, these medication doses may change and more medications may be added to optimize HF regimen - Patient has been educated on basic disease state pathophysiology and goals of therapy  Medication Assistance / Insurance Benefits Check: Does the patient have prescription insurance?  Yes  Type of insurance plan:  Does the patient qualify for medication assistance through manufacturers or grants? Pending   Outpatient Pharmacy: Prior to admission outpatient pharmacy: [facility]   Is the patient willing to utilize a [facility] pharmacy at discharge?: Yes Please do not hesitate to reach out with questions or concerns,  Enos Fling, PharmD, CPP, BCPS Heart Failure Pharmacist  Phone - [phone] 09/24/2023 8:09 AM

## 2023-09-24 NOTE — Consult Note (Signed)
Medications delivered.   Paschal Dopp, PharmD, BCPS

## 2023-09-24 NOTE — Progress Notes (Addendum)
[facility] CLINIC CARDIOLOGY PROGRESS NOTE       Patient ID: Brian Little MRN: [id number] DOB/AGE: September 29, 1934 89 y.o.  Admit date: 09/19/2023 Referring Physician Brian Grandchild, MD Primary Physician Brian Ferrier, MD Primary Cardiologist Brian Little Reason for Consultation Acute on chronic HFrEF  HPI: Brian Little is a 89 y.o. male  with a past medical history of HFrEF (EF 20-25%, newly diagnosed on 05/2023), bilateral carotid stenosis, hyperlipidemia, hypertension, hx of eosinophilic myocarditis, PAD with bilateral claudication who presented to the ED on 09/19/2023 for worsening shortness of breath. Cardiology was consulted for further evaluation.   Interval history: -Patient reports feeling better overall this AM.  -SOB significantly improved, feels this is mostly back to baseline. Denies CP.  -Mild LE edema on exam. Renal function improving.  -Frequent PVCs noted on telemetry. BP and HR stable.   Review of systems complete and found to be negative unless listed above    Past Medical History:  Diagnosis Date   BPH (benign prostatic hyperplasia)    Cancer (HCC)    skin   Hypertension     Past Surgical History:  Procedure Laterality Date   ADENOIDECTOMY     89 yrs old   PARTIAL COLECTOMY     polyps/ appendix removed at same time   ROTATOR CUFF REPAIR     x 2, 2008, 2011   ROTATOR CUFF REPAIR     left and right   SHOULDER ARTHROSCOPY WITH OPEN ROTATOR CUFF REPAIR AND DISTAL CLAVICLE ACROMINECTOMY Left 10/31/2022   Procedure: SHOULDER ARTHROSCOPY WITH OPEN ROTATOR CUFF REPAIR AND DISTAL CLAVICLE ACROMINECTOMY;  Surgeon: Brian Fairly, MD;  Location: [facility] ORS;  Service: Orthopedics;  Laterality: Left;   TONSILLECTOMY     89 years old    Medications Prior to Admission  Medication Sig Dispense Refill Last Dose   acetaminophen (TYLENOL) 500 MG tablet Take 500 mg by mouth every 6 (six) hours as needed.   unk   aspirin EC 81 MG tablet Take 1 tablet (81 mg total)  by mouth daily. Swallow whole. (Patient taking differently: Take 81 mg by mouth daily as needed for mild pain (pain score 1-3) or moderate pain (pain score 4-6). Swallow whole.) 30 tablet 12 unk   cetirizine (ZYRTEC) 10 MG tablet 10 mg daily as needed for allergies.   unk   CREON 12000-38000 units CPEP capsule Take 3 capsules by mouth 3 (three) times daily before meals.   09/19/2023   escitalopram (LEXAPRO) 5 MG tablet Take 5 mg by mouth daily.   09/19/2023   fluticasone (FLONASE) 50 MCG/ACT nasal spray Place 1 spray into both nostrils daily as needed for allergies or rhinitis.   unk   metoprolol succinate (TOPROL-XL) 25 MG 24 hr tablet Take 1 tablet (25 mg total) by mouth daily. (Patient taking differently: Take 12.5 mg by mouth daily.) 30 tablet 0 09/19/2023   mirtazapine (REMERON) 15 MG tablet Take 7.5 mg by mouth at bedtime.   09/18/2023   silodosin (RAPAFLO) 4 MG CAPS capsule Take 1 capsule (4 mg total) by mouth daily with breakfast. 90 capsule 3 09/19/2023   tadalafil (CIALIS) 5 MG tablet Take 1 tablet (5 mg total) by mouth daily. (Patient taking differently: Take 5 mg by mouth daily as needed.) 90 tablet 3 unk   torsemide (DEMADEX) 20 MG tablet Take 1 tablet (20 mg total) by mouth daily. (Patient taking differently: Take 40 mg by mouth daily.) 30 tablet 1 09/19/2023   losartan (COZAAR) 25  MG tablet Take 12.5 mg by mouth daily. (Patient not taking: Reported on 09/19/2023)   Not Taking   spironolactone (ALDACTONE) 25 MG tablet Take 0.5 tablets (12.5 mg total) by mouth daily. (Patient not taking: Reported on 09/19/2023) 30 tablet 1 Not Taking   Social History   Socioeconomic History   Marital status: Married    Spouse name: Not on file   Number of children: Not on file   Years of education: Not on file   Highest education level: Not on file  Occupational History   Not on file  Tobacco Use   Smoking status: Former    Current packs/day: 0.00    Types: Cigarettes    Quit date: 01/10/1980     Years since quitting: 43.7   Smokeless tobacco: Never  Vaping Use   Vaping status: Never Used  Substance and Sexual Activity   Alcohol use: Never   Drug use: Never   Sexual activity: Yes    Birth control/protection: None  Other Topics Concern   Not on file  Social History Narrative   Not on file   Social Determinants of Health   Financial Resource Strain: Not on file  Food Insecurity: No Food Insecurity (09/20/2023)   Hunger Vital Sign    Worried About Running Out of Food in the Last Year: Never true    Ran Out of Food in the Last Year: Never true  Transportation Needs: No Transportation Needs (09/20/2023)   PRAPARE - Administrator, Civil Service (Medical): No    Lack of Transportation (Non-Medical): No  Physical Activity: Not on file  Stress: Not on file  Social Connections: Not on file  Intimate Partner Violence: Not At Risk (09/20/2023)   Humiliation, Afraid, Rape, and Kick questionnaire    Fear of Current or Ex-Partner: No    Emotionally Abused: No    Physically Abused: No    Sexually Abused: No    Family History  Problem Relation Age of Onset   Heart disease Father    Congestive Heart Failure Father    COPD Mother    Hypertension Mother      Vitals:   09/23/23 2316 09/24/23 0345 09/24/23 0803 09/24/23 0950  BP: 99/70 123/61 110/64   Pulse: 81 72 69   Resp: 18 18 15    Temp: 97.6 F (36.4 C) (!) 97.4 F (36.3 C) 97.9 F (36.6 C)   TempSrc:      SpO2: 97% 96% 100%   Weight:    74.7 kg  Height:        PHYSICAL EXAM General: Elderly appearing male, well nourished, in no acute distress upright in bedside chair HEENT: Normocephalic and atraumatic. Neck: No JVD.   Lungs: Normal respiratory effort on RA. Clear bilaterally to auscultation. No wheezes, rhonchi, crackles.  Heart: HRRR. Normal S1 and S2 without gallops or murmurs.  Abdomen: Non-distended appearing.  Msk: Normal strength and tone for age. Extremities: Warm and well perfused. No  clubbing, cyanosis. Trace edema.  Neuro: Alert and oriented X 3. Psych: Answers questions appropriately.   Labs: Basic Metabolic Panel: Recent Labs    09/23/23 0442 09/24/23 0440  NA 125* 126*  K 3.6 4.2  CL 92* 94*  CO2 22 22  GLUCOSE 88 96  BUN 50* 41*  CREATININE 1.62* 1.54*  CALCIUM 8.4* 8.5*  MG 2.4  --    Liver Function Tests: No results for input(s): "AST", "ALT", "ALKPHOS", "BILITOT", "PROT", "ALBUMIN" in the last 72 hours.  No results for input(s): "LIPASE", "AMYLASE" in the last 72 hours. CBC: Recent Labs    09/23/23 0442  WBC 6.4  HGB 10.8*  HCT 32.2*  MCV 81.9  PLT 204   Cardiac Enzymes: No results for input(s): "CKTOTAL", "CKMB", "CKMBINDEX", "TROPONINIHS" in the last 72 hours.  BNP: Recent Labs    09/23/23 0442  BNP 3,003.9*   D-Dimer: No results for input(s): "DDIMER" in the last 72 hours. Hemoglobin A1C: No results for input(s): "HGBA1C" in the last 72 hours. Fasting Lipid Panel: No results for input(s): "CHOL", "HDL", "LDLCALC", "TRIG", "CHOLHDL", "LDLDIRECT" in the last 72 hours. Thyroid Function Tests: No results for input(s): "TSH", "T4TOTAL", "T3FREE", "THYROIDAB" in the last 72 hours.  Invalid input(s): "FREET3" Anemia Panel: No results for input(s): "VITAMINB12", "FOLATE", "FERRITIN", "TIBC", "IRON", "RETICCTPCT" in the last 72 hours.   Radiology: ECHOCARDIOGRAM COMPLETE  Result Date: 09/20/2023    ECHOCARDIOGRAM REPORT   Patient Name:   Brian Little Date of Exam: 09/19/2023 Medical Rec #:  [id number]       Height:       69.0 in Accession #:    [id number]      Weight:       153.0 lb Date of Birth:  10-Jul-1934       BSA:          1.844 m Patient Age:    89 years        BP:           105/79 mmHg Patient Gender: M               HR:           71 bpm. Exam Location:  [facility] Procedure: 2D Echo, Cardiac Doppler and Color Doppler Indications:     I50.21 Acute Systolic Heart Failure  History:         Patient has prior history of Echocardiogram  examinations, most                  recent 05/25/2023. Risk Factors:Hypertension.  Sonographer:     Brian Little RDCS Referring Phys:  [id number] [facility] Diagnosing Phys: Brian Little IMPRESSIONS  1. Left ventricular ejection fraction, by estimation, is 20 to 25%. The left ventricle has severely decreased function. The left ventricle demonstrates global hypokinesis. The left ventricular internal cavity size was mildly dilated. Left ventricular diastolic parameters are consistent with Grade III diastolic dysfunction (restrictive).  2. Right ventricular systolic function is mildly reduced. The right ventricular size is mildly enlarged. There is moderately elevated pulmonary artery systolic pressure. The estimated right ventricular systolic pressure is 55.3 mmHg.  3. Left atrial size was moderately dilated.  4. Right atrial size was mildly dilated.  5. Moderate mitral valve regurgitation.  6. Tricuspid valve regurgitation is mild to moderate.  7. The aortic valve is tricuspid. There is mild thickening of the aortic valve. Aortic valve regurgitation is mild to moderate.  8. The inferior vena cava is dilated in size with <50% respiratory variability, suggesting right atrial pressure of 15 mmHg. FINDINGS  Left Ventricle: Left ventricular ejection fraction, by estimation, is 20 to 25%. The left ventricle has severely decreased function. The left ventricle demonstrates global hypokinesis. The left ventricular internal cavity size was mildly dilated. There is no left ventricular hypertrophy. Left ventricular diastolic parameters are consistent with Grade III diastolic dysfunction (restrictive). Right Ventricle: The right ventricular size is mildly enlarged. No increase in right ventricular wall thickness. Right ventricular systolic function  is mildly reduced. There is moderately elevated pulmonary artery systolic pressure. The tricuspid regurgitant velocity is 3.29 m/s, and with an assumed right atrial  pressure of 12 mmHg, the estimated right ventricular systolic pressure is 55.3 mmHg. Left Atrium: Left atrial size was moderately dilated. Right Atrium: Right atrial size was mildly dilated. Pericardium: There is no evidence of pericardial effusion. Mitral Valve: There is mild thickening of the mitral valve leaflet(s). Moderate mitral valve regurgitation. Tricuspid Valve: The tricuspid valve is normal in structure. Tricuspid valve regurgitation is mild to moderate. Aortic Valve: The aortic valve is tricuspid. There is mild thickening of the aortic valve. Aortic valve regurgitation is mild to moderate. Aortic regurgitation PHT measures 521 msec. Pulmonic Valve: The pulmonic valve was not well visualized. Pulmonic valve regurgitation is trivial. Aorta: The aortic root and ascending aorta are structurally normal, with no evidence of dilitation. Venous: The inferior vena cava is dilated in size with less than 50% respiratory variability, suggesting right atrial pressure of 15 mmHg. IAS/Shunts: No atrial level shunt detected by color flow Doppler.  LEFT VENTRICLE PLAX 2D LVIDd:         5.20 cm   Diastology LVIDs:         4.70 cm   LV e' medial:    3.70 cm/s LV PW:         0.90 cm   LV E/e' medial:  24.1 LV IVS:        0.80 cm   LV e' lateral:   4.73 cm/s LVOT diam:     2.10 cm   LV E/e' lateral: 18.9 LV SV:         25 LV SV Index:   14 LVOT Area:     3.46 cm  RIGHT VENTRICLE            IVC RV Basal diam:  4.50 cm    IVC diam: 2.20 cm RV S prime:     8.12 cm/s TAPSE (M-mode): 1.8 cm LEFT ATRIUM             Index        RIGHT ATRIUM           Index LA diam:        5.10 cm 2.77 cm/m   RA Area:     20.20 cm LA Vol (A2C):   65.6 ml 35.58 ml/m  RA Volume:   66.60 ml  36.12 ml/m LA Vol (A4C):   70.3 ml 38.13 ml/m LA Biplane Vol: 68.4 ml 37.10 ml/m  AORTIC VALVE LVOT Vmax:   48.37 cm/s LVOT Vmean:  30.900 cm/s LVOT VTI:    0.073 m AI PHT:      521 msec  AORTA Ao Root diam: 3.30 cm Ao Asc diam:  3.30 cm MITRAL VALVE                TRICUSPID VALVE MV Area (PHT): 6.83 cm    TR Peak grad:   43.3 mmHg MV Decel Time: 111 msec    TR Vmax:        329.00 cm/s MV E velocity: 89.35 cm/s MV A velocity: 52.15 cm/s  SHUNTS MV E/A ratio:  1.71        Systemic VTI:  0.07 m                            Systemic Diam: 2.10 cm Brian Little Electronically signed by Brian Little Signature Date/Time: 09/20/2023/10:32:48  AM    Final    DG Chest 2 View  Result Date: 09/19/2023 CLINICAL DATA:  Shortness of breath for couple of days. History of CHF and pleural effusion EXAM: CHEST - 2 VIEW COMPARISON:  05/30/2023 FINDINGS: Cardiac enlargement. Mild vascular congestion. Small right pleural effusion with basilar atelectasis, developing since prior study. Previous left pleural effusion has resolved. No pneumothorax. Calcification of the aorta. Degenerative changes in the spine and shoulders. IMPRESSION: 1. Cardiac enlargement with pulmonary vascular congestion. 2. Small right pleural effusion with basilar atelectasis. This could indicate pneumonia or compressive atelectasis. 3. Aortic atherosclerosis. Electronically Signed   By: Brian Little M.D.   On: 09/19/2023 16:17   VAS US CAROTID  Result Date: 09/10/2023 Carotid Arterial Duplex Study Patient Name:  Brian Little  Date of Exam:   09/10/2023 Medical Rec #: [id number]        Accession #:    [id number] Date of Birth: 23-Oct-1934        Patient Gender: M Patient Age:   3 years Exam Location:  [facility] Procedure:      VAS US CAROTID Referring Phys: Brian Little --------------------------------------------------------------------------------  Indications:       Carotid artery disease. Comparison Study:  03/05/23 prior Performing Technologist: Brian Little RVS  Examination Guidelines: A complete evaluation includes B-mode imaging, spectral Doppler, color Doppler, and power Doppler as needed of all accessible portions of each vessel. Bilateral testing is considered an  integral part of a complete examination. Limited examinations for reoccurring indications may be performed as noted.  Right Carotid Findings: +----------+--------+--------+--------+-------------------------+--------+           PSV cm/sEDV cm/sStenosisPlaque Description       Comments +----------+--------+--------+--------+-------------------------+--------+ CCA Prox  49      13              heterogenous                      +----------+--------+--------+--------+-------------------------+--------+ CCA Distal55      11              heterogenous                      +----------+--------+--------+--------+-------------------------+--------+ ICA Prox  317     83      60-79%  heterogenous and calcific         +----------+--------+--------+--------+-------------------------+--------+ ICA Mid   111     21                                                +----------+--------+--------+--------+-------------------------+--------+ ICA Distal49      15                                                +----------+--------+--------+--------+-------------------------+--------+ ECA       248             >50%                                      +----------+--------+--------+--------+-------------------------+--------+ +----------+--------+-------+--------+-------------------+           PSV cm/sEDV cmsDescribeArm  Pressure (mmHG) +----------+--------+-------+--------+-------------------+ [id number]                                         +----------+--------+-------+--------+-------------------+ +---------+--------+--+--------+-+---------+ VertebralPSV cm/s50EDV cm/s8Antegrade +---------+--------+--+--------+-+---------+  Left Carotid Findings: +----------+--------+--------+--------+-------------------------+--------+           PSV cm/sEDV cm/sStenosisPlaque Description       Comments +----------+--------+--------+--------+-------------------------+--------+  CCA Prox  67      14              heterogenous                      +----------+--------+--------+--------+-------------------------+--------+ CCA Distal60      16              heterogenous                      +----------+--------+--------+--------+-------------------------+--------+ ICA Prox  153     24      1-39%   heterogenous and calcific         +----------+--------+--------+--------+-------------------------+--------+ ICA Mid   136     23                                                +----------+--------+--------+--------+-------------------------+--------+ ICA Distal61      24                                                +----------+--------+--------+--------+-------------------------+--------+ ECA       189                                                       +----------+--------+--------+--------+-------------------------+--------+ +----------+--------+--------+--------+-------------------+           PSV cm/sEDV cm/sDescribeArm Pressure (mmHG) +----------+--------+--------+--------+-------------------+ [id number]                                          +----------+--------+--------+--------+-------------------+ +---------+--------+--+--------+--+---------+ VertebralPSV cm/s48EDV cm/s18Antegrade +---------+--------+--+--------+--+---------+   Summary: Right Carotid: Velocities in the right ICA are consistent with a 60-79%                stenosis. The ECA appears >50% stenosed. Left Carotid: Velocities in the left ICA are consistent with a 1-39% stenosis. Vertebrals:  Bilateral vertebral arteries demonstrate antegrade flow. Subclavians: Normal flow hemodynamics were seen in bilateral subclavian              arteries. *See table(s) above for measurements and observations.  Electronically signed by Brian Else MD on 09/10/2023 at 3:26:03 PM.    Final     ECHO As above (EF 20-25%)  TELEMETRY reviewed by me 09/24/2023: sinus rhythm PVCs  rate 80s  EKG reviewed by me: SR with 1st degree AVB, LAD and PVCs at 89 bpm (non-acute)  Data reviewed by me 09/24/2023: last 24h vitals tele labs imaging I/O hospitalist progress note  Pertinent Cardiac history:  Stress Test 06/2023:  1.  Moderate to severely reduced left ventricular function  2.  Global hypokinesis  3.  Partially fixed anteroapical defect with incomplete redistribution  consistent with scar with peri-infarct ischemia   Holter 05/2023: 23% PVC burden   Principal Problem:   Acute on chronic HFrEF (heart failure with reduced ejection fraction) (HCC) Active Problems:   Benign prostatic hyperplasia with urinary obstruction   Hyperlipidemia   Essential hypertension   Elevated troponin   Hyponatremia    ASSESSMENT AND PLAN:  JARAMIAH BOSSARD is a 89 y.o. male  with a past medical history of HFrEF (EF 20-25%, newly diagnosed 05/2023), bilateral carotid stenosis, hyperlipidemia, hypertension, hx of eosinophilic myocarditis, PAD with bilateral claudication who presented to the ED on 09/19/2023 for worsening shortness of breath. Cardiology was consulted for further evaluation.   #Acute on chronic HFrEF # Demand ischemia Patient presents to the ED with elevated BNP of > 4500 and worsening DOE. Troponin mildly elevated and flat 192 > 176, consistent with demand/supply in the setting of HFrEF. No acute changes on EKG. Echo this admission with EF 20-25%. -Transitioned to po torsemide 20 mg bid. -Start metoprolol succinate 12.5 mg daily and farxiga 5 mg daily for GDMT. Consider further additions to GDMT with ACE/ARB, MRA at outpatient follow up appointment pending BP and renal function.  -No plan for further cardiac diagnostics. Will plan for medical management of heart failure.  #Hypertension #Hyperlipidemia BP stable throughout admission.  -Continue home ASA 81 mg  #AKI Cr elevated at 2.08 on admission, now improved to 1.54.  -Monitor electrolytes and strict I's &  O's -Follow renal function closely  Ok for discharge today from a cardiac perspective. Patient scheduled for follow up at [facility] Heart Failure clinic tomorrow with Dr. Allena Katz. Also scheduled for hospital follow up on 12/3 with Brian Conception, NP.  This patient's plan of care was discussed and created with Dr. Melton Alar and she is in agreement.  Signed: Gale Journey, PA-C  09/24/2023, 10:38 AM [facility]

## 2023-09-24 NOTE — Discharge Summary (Signed)
Physician Discharge Summary   Patient: Brian Little MRN: [id number] DOB: 08/06/1934  Admit date:     09/19/2023  Discharge date: 09/24/2023  Discharge Physician: Pennie Banter   PCP: Lynnea Ferrier, MD   Recommendations at discharge:  {Tip this will not be part of the note when signed- Example include specific recommendations for outpatient follow-up, pending tests to follow-up on. (Optional):26781} Follow up at [facility] Heart Failure clinic tomorrow with Dr. Allena Katz.  Follow up on 12/3 with Marijo Conception, NP at [facility] Cardiology Follow up with Primary Care in 1-2 weeks Follow up with scheduled with Psychiatry Regarding hyponatremia -- most likely this is related to his volume status with CHF, however, would consider Lexapro and/or Remeron as potential causes if hyponatremia becomes recurrent/persistent issue if volume status is otherwise stable. Repeat BMP, Mg, CBC within 1 week  Discharge Diagnoses: Principal Problem:   Acute on chronic HFrEF (heart failure with reduced ejection fraction) (HCC) Active Problems:   Benign prostatic hyperplasia with urinary obstruction   Hyperlipidemia   Essential hypertension   Elevated troponin   Hyponatremia  Resolved Problems:   * No resolved hospital problems. *  Hospital Course: No notes on file  Assessment and Plan: * Acute on chronic HFrEF (heart failure with reduced ejection fraction) (HCC) Pt presents with dyspnea on exertion, BNP > 4500, chest x-ray with vascular congestion and small right pleural effusion.  No peripheral edema.  Prior Echo 05/25/2023 -- EF 35-40%, normal diastolic fx, moderately elevated PASP, mod-severe MR, mild-mod AR Sent to ED from Cardiology clinic.  Echo this admission: EF 20-25%, grade 3 DD  Diuresing well.  Renal function improving. Net IO Since Admission: -4,455 mL [09/23/23 1600]  --Cardiology consulted --Continue IV Lasix to 80 mg BID --Cardiology added low dose hydralazine and Imdur --On midodrine for  BP to tolerate diuresis --Strict Io's & daily weights --Follow renal function & electrolytes daily --Further GDMT per cardiology, as BP and renal function permit  Hyponatremia Na 129 on admission, suspect hypervolemic given CHF decompensation. Pt has history of chronic hyponatremia as well, ranging 124-130 in recent 3-4 months. --Diuresis underway, mgmt of CHF as outlined --Follow BMP's daily --Nephrology consult & further evaluation if worsening  Elevated troponin Demand ischemia due to CHF decompensation.  EKG no acute ischemic changes.  Pt denies current or recent chest pain.  Unlikely ACS. --Cardiology consulted  Essential hypertension BP's 116/104 >> 114/74 in the ED On IV Lasix 40 mg BID Hold home metop for acute CHF decompensation for now Per med hx - pt no longer takes spironolactone or losartan.  Hyperlipidemia Appears not on statin.  Benign prostatic hyperplasia with urinary obstruction Flomax sub'd for home silodosin/Rapaflo      {Tip this will not be part of the note when signed Body mass index is 24.31 kg/m. , ,  (Optional):26781}  {(NOTE) Pain control PDMP Statment (Optional):26782} Consultants: *** Procedures performed: ***  Disposition: {Plan; Disposition:26390} Diet recommendation:  Discharge Diet Orders (From admission, onward)     Start     Ordered   09/24/23 0000  Diet - low sodium heart healthy        09/24/23 1108           {Diet_Plan:26776} DISCHARGE MEDICATION: Allergies as of 09/24/2023       Reactions   Bee Venom Anaphylaxis   Caffeine Other (See Comments)   Other reaction(s): Other Vertigo vertigo   Ibuprofen    Other reaction(s): Other (See Comments), Other (See Comments)  vertigo vertigo   Nsaids Other (See Comments)   Other reaction(s): Other (See Comments) Vertigo and n/v Vertigo and n/v   Trazodone Other (See Comments)   "Dizziness" per pt report        Medication List     STOP taking these medications     losartan 25 MG tablet Commonly known as: COZAAR   spironolactone 25 MG tablet Commonly known as: ALDACTONE       TAKE these medications    acetaminophen 500 MG tablet Commonly known as: TYLENOL Take 500 mg by mouth every 6 (six) hours as needed.   aspirin EC 81 MG tablet Take 1 tablet (81 mg total) by mouth daily. Swallow whole. What changed:  when to take this reasons to take this   cetirizine 10 MG tablet Commonly known as: ZYRTEC 10 mg daily as needed for allergies.   Creon 12000-38000 units Cpep capsule Generic drug: lipase/protease/amylase Take 3 capsules by mouth 3 (three) times daily before meals.   dapagliflozin propanediol 5 MG Tabs tablet Commonly known as: FARXIGA Take 1 tablet (5 mg total) by mouth daily.   escitalopram 5 MG tablet Commonly known as: LEXAPRO Take 5 mg by mouth daily.   fluticasone 50 MCG/ACT nasal spray Commonly known as: FLONASE Place 1 spray into both nostrils daily as needed for allergies or rhinitis.   metoprolol succinate 25 MG 24 hr tablet Commonly known as: TOPROL-XL Take 0.5 tablets (12.5 mg total) by mouth daily.   midodrine 5 MG tablet Commonly known as: PROAMATINE Take 1 tablet (5 mg total) by mouth 3 (three) times daily with meals.   mirtazapine 15 MG tablet Commonly known as: REMERON Take 1 tablet (15 mg total) by mouth at bedtime. What changed: how much to take   silodosin 4 MG Caps capsule Commonly known as: RAPAFLO Take 1 capsule (4 mg total) by mouth daily with breakfast.   tadalafil 5 MG tablet Commonly known as: CIALIS Take 1 tablet (5 mg total) by mouth daily. What changed:  when to take this reasons to take this   torsemide 20 MG tablet Commonly known as: DEMADEX Take 1 tablet (20 mg total) by mouth 2 (two) times daily. What changed: when to take this        Follow-up Information     Germaine Pomfret, Eliane Decree, NP. Go in 1 week(s).   Specialty: Nurse Practitioner Contact  information: [address] [place] [address] [phone]                Discharge Exam: Ceasar Mons Weights   09/20/23 0249 09/23/23 0756 09/24/23 0950  Weight: 70.1 kg 73.2 kg 74.7 kg   ***  Condition at discharge: {DC Condition:26389}  The results of significant diagnostics from this hospitalization (including imaging, microbiology, ancillary and laboratory) are listed below for reference.   Imaging Studies: ECHOCARDIOGRAM COMPLETE  Result Date: 09/20/2023    ECHOCARDIOGRAM REPORT   Patient Name:   PROSPERO MAHNKE Date of Exam: 09/19/2023 Medical Rec #:  [id number]       Height:       69.0 in Accession #:    [id number]      Weight:       153.0 lb Date of Birth:  [birth year]       BSA:          1.844 m Patient Age:    89 years        BP:  105/79 mmHg Patient Gender: M               HR:           71 bpm. Exam Location:  [facility] Procedure: 2D Echo, Cardiac Doppler and Color Doppler Indications:     I50.21 Acute Systolic Heart Failure  History:         Patient has prior history of Echocardiogram examinations, most                  recent 05/25/2023. Risk Factors:Hypertension.  Sonographer:     Daphine Deutscher RDCS Referring Phys:  [id number] [facility] A Saron Tweed Diagnosing Phys: Mellody Drown Alluri IMPRESSIONS  1. Left ventricular ejection fraction, by estimation, is 20 to 25%. The left ventricle has severely decreased function. The left ventricle demonstrates global hypokinesis. The left ventricular internal cavity size was mildly dilated. Left ventricular diastolic parameters are consistent with Grade III diastolic dysfunction (restrictive).  2. Right ventricular systolic function is mildly reduced. The right ventricular size is mildly enlarged. There is moderately elevated pulmonary artery systolic pressure. The estimated right ventricular systolic pressure is 55.3 mmHg.  3. Left atrial size was moderately dilated.  4. Right atrial size was mildly dilated.  5. Moderate mitral valve  regurgitation.  6. Tricuspid valve regurgitation is mild to moderate.  7. The aortic valve is tricuspid. There is mild thickening of the aortic valve. Aortic valve regurgitation is mild to moderate.  8. The inferior vena cava is dilated in size with <50% respiratory variability, suggesting right atrial pressure of 15 mmHg. FINDINGS  Left Ventricle: Left ventricular ejection fraction, by estimation, is 20 to 25%. The left ventricle has severely decreased function. The left ventricle demonstrates global hypokinesis. The left ventricular internal cavity size was mildly dilated. There is no left ventricular hypertrophy. Left ventricular diastolic parameters are consistent with Grade III diastolic dysfunction (restrictive). Right Ventricle: The right ventricular size is mildly enlarged. No increase in right ventricular wall thickness. Right ventricular systolic function is mildly reduced. There is moderately elevated pulmonary artery systolic pressure. The tricuspid regurgitant velocity is 3.29 m/s, and with an assumed right atrial pressure of 12 mmHg, the estimated right ventricular systolic pressure is 55.3 mmHg. Left Atrium: Left atrial size was moderately dilated. Right Atrium: Right atrial size was mildly dilated. Pericardium: There is no evidence of pericardial effusion. Mitral Valve: There is mild thickening of the mitral valve leaflet(s). Moderate mitral valve regurgitation. Tricuspid Valve: The tricuspid valve is normal in structure. Tricuspid valve regurgitation is mild to moderate. Aortic Valve: The aortic valve is tricuspid. There is mild thickening of the aortic valve. Aortic valve regurgitation is mild to moderate. Aortic regurgitation PHT measures 521 msec. Pulmonic Valve: The pulmonic valve was not well visualized. Pulmonic valve regurgitation is trivial. Aorta: The aortic root and ascending aorta are structurally normal, with no evidence of dilitation. Venous: The inferior vena cava is dilated in size  with less than 50% respiratory variability, suggesting right atrial pressure of 15 mmHg. IAS/Shunts: No atrial level shunt detected by color flow Doppler.  LEFT VENTRICLE PLAX 2D LVIDd:         5.20 cm   Diastology LVIDs:         4.70 cm   LV e' medial:    3.70 cm/s LV PW:         0.90 cm   LV E/e' medial:  24.1 LV IVS:        0.80 cm   LV e' lateral:  4.73 cm/s LVOT diam:     2.10 cm   LV E/e' lateral: 18.9 LV SV:         25 LV SV Index:   14 LVOT Area:     3.46 cm  RIGHT VENTRICLE            IVC RV Basal diam:  4.50 cm    IVC diam: 2.20 cm RV S prime:     8.12 cm/s TAPSE (M-mode): 1.8 cm LEFT ATRIUM             Index        RIGHT ATRIUM           Index LA diam:        5.10 cm 2.77 cm/m   RA Area:     20.20 cm LA Vol (A2C):   65.6 ml 35.58 ml/m  RA Volume:   66.60 ml  36.12 ml/m LA Vol (A4C):   70.3 ml 38.13 ml/m LA Biplane Vol: 68.4 ml 37.10 ml/m  AORTIC VALVE LVOT Vmax:   48.37 cm/s LVOT Vmean:  30.900 cm/s LVOT VTI:    0.073 m AI PHT:      521 msec  AORTA Ao Root diam: 3.30 cm Ao Asc diam:  3.30 cm MITRAL VALVE               TRICUSPID VALVE MV Area (PHT): 6.83 cm    TR Peak grad:   43.3 mmHg MV Decel Time: 111 msec    TR Vmax:        329.00 cm/s MV E velocity: 89.35 cm/s MV A velocity: 52.15 cm/s  SHUNTS MV E/A ratio:  1.71        Systemic VTI:  0.07 m                            Systemic Diam: 2.10 cm Windell Norfolk Electronically signed by Windell Norfolk Signature Date/Time: 09/20/2023/10:32:48 AM    Final    DG Chest 2 View  Result Date: 09/19/2023 CLINICAL DATA:  Shortness of breath for couple of days. History of CHF and pleural effusion EXAM: CHEST - 2 VIEW COMPARISON:  05/30/2023 FINDINGS: Cardiac enlargement. Mild vascular congestion. Small right pleural effusion with basilar atelectasis, developing since prior study. Previous left pleural effusion has resolved. No pneumothorax. Calcification of the aorta. Degenerative changes in the spine and shoulders. IMPRESSION: 1. Cardiac enlargement  with pulmonary vascular congestion. 2. Small right pleural effusion with basilar atelectasis. This could indicate pneumonia or compressive atelectasis. 3. Aortic atherosclerosis. Electronically Signed   By: Burman Nieves M.D.   On: 09/19/2023 16:17   VAS US CAROTID  Result Date: 09/10/2023 Carotid Arterial Duplex Study Patient Name:  YOAN SALLADE  Date of Exam:   09/10/2023 Medical Rec #: [id number]        Accession #:    [id number] Date of Birth: 29-Dec-1933        Patient Gender: M Patient Age:   60 years Exam Location:  [facility] Procedure:      VAS US CAROTID Referring Phys: Coral Else --------------------------------------------------------------------------------  Indications:       Carotid artery disease. Comparison Study:  03/05/23 prior Performing Technologist: Argentina Ponder RVS  Examination Guidelines: A complete evaluation includes B-mode imaging, spectral Doppler, color Doppler, and power Doppler as needed of all accessible portions of each vessel. Bilateral testing is considered an integral part of a complete examination. Limited examinations  for reoccurring indications may be performed as noted.  Right Carotid Findings: +----------+--------+--------+--------+-------------------------+--------+           PSV cm/sEDV cm/sStenosisPlaque Description       Comments +----------+--------+--------+--------+-------------------------+--------+ CCA Prox  49      13              heterogenous                      +----------+--------+--------+--------+-------------------------+--------+ CCA Distal55      11              heterogenous                      +----------+--------+--------+--------+-------------------------+--------+ ICA Prox  317     83      60-79%  heterogenous and calcific         +----------+--------+--------+--------+-------------------------+--------+ ICA Mid   111     21                                                 +----------+--------+--------+--------+-------------------------+--------+ ICA Distal49      15                                                +----------+--------+--------+--------+-------------------------+--------+ ECA       248             >50%                                      +----------+--------+--------+--------+-------------------------+--------+ +----------+--------+-------+--------+-------------------+           PSV cm/sEDV cmsDescribeArm Pressure (mmHG) +----------+--------+-------+--------+-------------------+ [id number]                                         +----------+--------+-------+--------+-------------------+ +---------+--------+--+--------+-+---------+ VertebralPSV cm/s50EDV cm/s8Antegrade +---------+--------+--+--------+-+---------+  Left Carotid Findings: +----------+--------+--------+--------+-------------------------+--------+           PSV cm/sEDV cm/sStenosisPlaque Description       Comments +----------+--------+--------+--------+-------------------------+--------+ CCA Prox  67      14              heterogenous                      +----------+--------+--------+--------+-------------------------+--------+ CCA Distal60      16              heterogenous                      +----------+--------+--------+--------+-------------------------+--------+ ICA Prox  153     24      1-39%   heterogenous and calcific         +----------+--------+--------+--------+-------------------------+--------+ ICA Mid   136     23                                                +----------+--------+--------+--------+-------------------------+--------+ ICA Distal61  24                                                +----------+--------+--------+--------+-------------------------+--------+ ECA       189                                                        +----------+--------+--------+--------+-------------------------+--------+ +----------+--------+--------+--------+-------------------+           PSV cm/sEDV cm/sDescribeArm Pressure (mmHG) +----------+--------+--------+--------+-------------------+ [id number]                                          +----------+--------+--------+--------+-------------------+ +---------+--------+--+--------+--+---------+ VertebralPSV cm/s48EDV cm/s18Antegrade +---------+--------+--+--------+--+---------+   Summary: Right Carotid: Velocities in the right ICA are consistent with a 60-79%                stenosis. The ECA appears >50% stenosed. Left Carotid: Velocities in the left ICA are consistent with a 1-39% stenosis. Vertebrals:  Bilateral vertebral arteries demonstrate antegrade flow. Subclavians: Normal flow hemodynamics were seen in bilateral subclavian              arteries. *See table(s) above for measurements and observations.  Electronically signed by Coral Else MD on 09/10/2023 at 3:26:03 PM.    Final     Microbiology: Results for orders placed or performed during the hospital encounter of 05/30/23  Blood Culture (routine x 2)     Status: None   Collection Time: 05/30/23  3:15 AM   Specimen: BLOOD  Result Value Ref Range Status   Specimen Description BLOOD BLOOD RIGHT WRIST  Final   Special Requests   Final    BOTTLES DRAWN AEROBIC AND ANAEROBIC Blood Culture adequate volume   Culture   Final    NO GROWTH 5 DAYS Performed at [facility], [address], [place], [place] [address]    Report Status 06/04/2023 FINAL  Final  Blood Culture (routine x 2)     Status: None   Collection Time: 05/30/23  3:15 AM   Specimen: BLOOD  Result Value Ref Range Status   Specimen Description BLOOD WRIST  Final   Special Requests   Final    BOTTLES DRAWN AEROBIC AND ANAEROBIC Blood Culture adequate volume   Culture   Final    NO GROWTH 5 DAYS Performed at [facility], [address], [place], [place] [address]    Report Status 06/04/2023 FINAL  Final  Resp panel by RT-PCR (RSV, Flu A&B, Covid) Anterior Nasal Swab     Status: None   Collection Time: 05/30/23  3:48 AM   Specimen: Anterior Nasal Swab  Result Value Ref Range Status   SARS Coronavirus 2 by RT PCR NEGATIVE NEGATIVE Final    Comment: (NOTE) SARS-CoV-2 target nucleic acids are NOT DETECTED.  The SARS-CoV-2 RNA is generally detectable in upper respiratory specimens during the acute phase of infection. The lowest concentration of SARS-CoV-2 viral copies this assay can detect is 138 copies/mL. A negative result does not preclude SARS-Cov-2 infection and should not be used as the sole basis for treatment or other patient management decisions. A  negative result may occur with  improper specimen collection/handling, submission of specimen other than nasopharyngeal swab, presence of viral mutation(s) within the areas targeted by this assay, and inadequate number of viral copies(<138 copies/mL). A negative result must be combined with clinical observations, patient history, and epidemiological information. The expected result is Negative.  Fact Sheet for Patients:  BloggerCourse.com  Fact Sheet for Healthcare Providers:  SeriousBroker.it  This test is no t yet approved or cleared by the [place] FDA and  has been authorized for detection and/or diagnosis of SARS-CoV-2 by FDA under an Emergency Use Authorization (EUA). This EUA will remain  in effect (meaning this test can be used) for the duration of the COVID-19 declaration under Section 564(b)(1) of the Act, 21 U.S.C.section 360bbb-3(b)(1), unless the authorization is terminated  or revoked sooner.       Influenza A by PCR NEGATIVE NEGATIVE Final   Influenza B by PCR NEGATIVE NEGATIVE Final    Comment: (NOTE) The Xpert Xpress SARS-CoV-2/FLU/RSV plus assay is intended as an aid in the  diagnosis of influenza from Nasopharyngeal swab specimens and should not be used as a sole basis for treatment. Nasal washings and aspirates are unacceptable for Xpert Xpress SARS-CoV-2/FLU/RSV testing.  Fact Sheet for Patients: BloggerCourse.com  Fact Sheet for Healthcare Providers: SeriousBroker.it  This test is not yet approved or cleared by the [place] FDA and has been authorized for detection and/or diagnosis of SARS-CoV-2 by FDA under an Emergency Use Authorization (EUA). This EUA will remain in effect (meaning this test can be used) for the duration of the COVID-19 declaration under Section 564(b)(1) of the Act, 21 U.S.C. section 360bbb-3(b)(1), unless the authorization is terminated or revoked.     Resp Syncytial Virus by PCR NEGATIVE NEGATIVE Final    Comment: (NOTE) Fact Sheet for Patients: BloggerCourse.com  Fact Sheet for Healthcare Providers: SeriousBroker.it  This test is not yet approved or cleared by the [place] FDA and has been authorized for detection and/or diagnosis of SARS-CoV-2 by FDA under an Emergency Use Authorization (EUA). This EUA will remain in effect (meaning this test can be used) for the duration of the COVID-19 declaration under Section 564(b)(1) of the Act, 21 U.S.C. section 360bbb-3(b)(1), unless the authorization is terminated or revoked.  Performed at [facility], [address] [place], [place] [address]     Labs: CBC: Recent Labs  Lab 09/23/23 0442  WBC 6.4  HGB 10.8*  HCT 32.2*  MCV 81.9  PLT 204   Basic Metabolic Panel: Recent Labs  Lab 09/20/23 0521 09/21/23 0354 09/22/23 0543 09/23/23 0442 09/24/23 0440  NA 129* 126* 125* 125* 126*  K 3.9 4.1 3.1* 3.6 4.2  CL 95* 92* 90* 92* 94*  CO2 24 23 23 22 22   GLUCOSE 101* 92 107* 88 96  BUN 62* 60* 55* 50* 41*  CREATININE 2.04* 2.00* 1.79* 1.62*  1.54*  CALCIUM 8.5* 8.3* 8.3* 8.4* 8.5*  MG 2.6*  --   --  2.4  --    Liver Function Tests: Recent Labs  Lab 09/19/23 1404  AST 35  ALT 22  ALKPHOS 136*  BILITOT 1.1  PROT 7.3  ALBUMIN 3.8   CBG: Recent Labs  Lab 09/20/23 0928 09/20/23 1130  GLUCAP 150* 132*    Discharge time spent: {LESS THAN/GREATER THAN:26388} 30 minutes.  Signed: Pennie Banter, DO [facility] 09/24/2023

## 2023-09-24 NOTE — TOC Transition Note (Signed)
Transition of Care [facility]) - CM/SW Discharge Note   Patient Details  Name: Brian Little MRN: [id number] Date of Birth: Jan 12, 1934  Transition of Care [facility]) CM/SW Contact:  Truddie Hidden, RN Phone Number: 09/24/2023, 12:40 PM   Clinical Narrative:    Spoke with patient and his spouse regarding HH. Patient refused HH. He as advised even if HH was arranged he could change his mind at any time. He is not agreeable. He was advised to follow up with his PCP post discharge if [facility] is desired.   TOC signing off.            Patient Goals and CMS Choice      Discharge Placement                         Discharge Plan and Services Additional resources added to the After Visit Summary for                                       Social Determinants of Health (SDOH) Interventions SDOH Screenings   Food Insecurity: No Food Insecurity (09/20/2023)  Housing: Patient Declined (09/20/2023)  Transportation Needs: No Transportation Needs (09/20/2023)  Utilities: Not At Risk (09/20/2023)  Tobacco Use: Medium Risk (09/19/2023)     Readmission Risk Interventions     No data to display

## 2023-09-24 NOTE — Plan of Care (Signed)

## 2023-09-26 ENCOUNTER — Telehealth: Payer: Self-pay | Admitting: Urology

## 2023-09-26 ENCOUNTER — Other Ambulatory Visit: Payer: Self-pay | Admitting: *Deleted

## 2023-09-26 DIAGNOSIS — N401 Enlarged prostate with lower urinary tract symptoms: Secondary | ICD-10-CM

## 2023-09-26 NOTE — Telephone Encounter (Signed)
Sent request to DR. Stoioff

## 2023-09-26 NOTE — Telephone Encounter (Signed)
Pt has changed pharmacies from [facility] to [facility] on [address] and would like a Academic librarian for Cialis sent there.  He only has 1 left.

## 2023-09-27 MED ORDER — TADALAFIL 5 MG PO TABS: 5.0000 mg | ORAL_TABLET | Freq: Every day | ORAL | 6 refills | Status: DC | PRN

## 2023-09-28 ENCOUNTER — Telehealth: Payer: Self-pay

## 2023-09-28 NOTE — Telephone Encounter (Signed)
Brian Avalon left a message stating that Tadalafil needs PA, I checked with covermymeds and there was a PA request. PA submitted for medication for BPH Key: [id number]. Pending decision  I called Brian Little and updated her on this, advised we may not be able to get an answer until Monday but that she could call [facility] on Sunday to ask them to re run medication with insurance to see if it will go thru. Follow up on Monday on covermymeds.

## 2023-10-01 NOTE — Telephone Encounter (Addendum)
PA approved until 09/27/2024. Spoke with Brian Little and advised of this information. I called [facility] and left message for pharmacy advising them PA was approved and its for BPH not ED\.

## 2023-10-02 ENCOUNTER — Other Ambulatory Visit: Payer: Self-pay

## 2023-10-02 DIAGNOSIS — I6523 Occlusion and stenosis of bilateral carotid arteries: Secondary | ICD-10-CM

## 2023-10-03 ENCOUNTER — Other Ambulatory Visit
Admission: RE | Admit: 2023-10-03 | Discharge: 2023-10-03 | Disposition: A | Discharge status: Home / Self Care | Payer: Medicare Other | Source: Ambulatory Visit | Attending: Nurse Practitioner | Admitting: Nurse Practitioner

## 2023-10-03 DIAGNOSIS — I5020 Unspecified systolic (congestive) heart failure: Secondary | ICD-10-CM | POA: Diagnosis present

## 2023-10-03 LAB — BRAIN NATRIURETIC PEPTIDE: B Natriuretic Peptide: >4500 pg/mL — ABNORMAL HIGH (ref 0.0–100.0)

## 2023-10-04 ENCOUNTER — Other Ambulatory Visit: Payer: Self-pay | Admitting: *Deleted

## 2023-10-04 ENCOUNTER — Telehealth: Payer: Self-pay | Admitting: Urology

## 2023-10-04 MED ORDER — SILODOSIN 4 MG PO CAPS: 4.0000 mg | ORAL_CAPSULE | Freq: Every day | ORAL | 3 refills | Status: DC

## 2023-10-09 ENCOUNTER — Other Ambulatory Visit: Payer: Self-pay

## 2023-10-09 NOTE — Telephone Encounter (Signed)
Advised patient wife that they need to talk with [facility] and see what is going on . Let our office know if there is anything we can do to help. She agrees.

## 2023-10-09 NOTE — Telephone Encounter (Signed)
Pt's wife went to pick up RX for Sildosin at [facility] and it was going to cost $130.  They have BCBS RX and Medicare.  Pt asked why would it be so expensive.

## 2023-10-21 DEATH — deceased

## 2024-03-03 ENCOUNTER — Encounter (HOSPITAL_COMMUNITY): Payer: Medicare Other

## 2024-03-03 ENCOUNTER — Ambulatory Visit: Payer: Medicare Other
